# Patient Record
Sex: Female | Born: 1995 | Race: Black or African American | Hispanic: No | Marital: Single | Smoking: Former smoker
Health system: Southern US, Community
[De-identification: ages and names within clinical notes are randomized; demographics above are authoritative.]

## PROBLEM LIST (undated history)

## (undated) DIAGNOSIS — J309 Allergic rhinitis, unspecified: Secondary | ICD-10-CM

## (undated) DIAGNOSIS — Z8619 Personal history of other infectious and parasitic diseases: Secondary | ICD-10-CM

## (undated) DIAGNOSIS — R51 Headache: Secondary | ICD-10-CM

## (undated) DIAGNOSIS — G43829 Menstrual migraine, not intractable, without status migrainosus: Secondary | ICD-10-CM

## (undated) DIAGNOSIS — I10 Essential (primary) hypertension: Secondary | ICD-10-CM

## (undated) DIAGNOSIS — R519 Headache, unspecified: Secondary | ICD-10-CM

## (undated) HISTORY — DX: Personal history of other infectious and parasitic diseases: Z86.19

## (undated) HISTORY — PX: CHOLECYSTECTOMY: SHX55

## (undated) HISTORY — PX: MOUTH SURGERY: SHX715

## (undated) HISTORY — PX: SKIN BIOPSY: SHX1

## (undated) HISTORY — DX: Allergic rhinitis, unspecified: J30.9

## (undated) HISTORY — DX: Menstrual migraine, not intractable, without status migrainosus: G43.829

## (undated) HISTORY — DX: Essential (primary) hypertension: I10

---
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
## 2015-03-05 ENCOUNTER — Encounter: Payer: Self-pay | Admitting: Allergy and Immunology

## 2015-03-05 ENCOUNTER — Ambulatory Visit (INDEPENDENT_AMBULATORY_CARE_PROVIDER_SITE_OTHER): Payer: Medicaid Other | Admitting: Allergy and Immunology

## 2015-03-05 VITALS — BP 116/74 | HR 68 | Temp 97.8°F | Resp 18

## 2015-03-05 DIAGNOSIS — J4541 Moderate persistent asthma with (acute) exacerbation: Secondary | ICD-10-CM | POA: Diagnosis not present

## 2015-03-05 DIAGNOSIS — H1010 Acute atopic conjunctivitis, unspecified eye: Secondary | ICD-10-CM | POA: Diagnosis not present

## 2015-03-05 DIAGNOSIS — J309 Allergic rhinitis, unspecified: Secondary | ICD-10-CM

## 2015-03-05 MED ORDER — IPRATROPIUM BROMIDE 0.02 % IN SOLN
0.5000 mg | Freq: Once | RESPIRATORY_TRACT | Status: AC
  Administered 2015-03-05: 0.5 mg via RESPIRATORY_TRACT

## 2015-03-05 MED ORDER — ALBUTEROL SULFATE HFA 108 (90 BASE) MCG/ACT IN AERS: 2.0000 | INHALATION_SPRAY | RESPIRATORY_TRACT | Status: DC | PRN

## 2015-03-05 MED ORDER — LEVALBUTEROL HCL 1.25 MG/3ML IN NEBU
1.2500 mg | INHALATION_SOLUTION | Freq: Once | RESPIRATORY_TRACT | Status: AC
  Administered 2015-03-05: 1.25 mg via RESPIRATORY_TRACT

## 2015-03-05 MED ORDER — BECLOMETHASONE DIPROPIONATE 80 MCG/ACT IN AERS: 2.0000 | INHALATION_SPRAY | Freq: Two times a day (BID) | RESPIRATORY_TRACT | Status: DC

## 2015-03-05 NOTE — Patient Instructions (Addendum)
Take Home Sheet  1. Avoidance: Mite, Mold and Pollen   2. Antihistamine: Clarinex 5mg  by mouth once daily for runny nose or itching.   3. Nasal Spray: Dymista 1 spray(s) each nostril twice daily for stuffy nose or drainage.       Nasal Saline wash followed by above nasal spray twice daily as directed.  4. Inhalers:  Rescue: ProAir 2 puffs every 4 hours as needed for cough or wheeze.       -May use 2 puffs 10-20 minutes prior to exercise.   Preventative: QVAR 80mcg 2 puffs twice daily (Rinse, gargle, and spit out after use).   5.  Prednisone 30mg  now and 30mg  tomorrow.  6.  Information on allergy injections.      [facility].  7. Follow up Visit:  3 months or sooner if needed.                                 Call with any questions or concerns.   Websites that have reliable Patient information: 1. American Academy of Asthma, Allergy, & Immunology: www.aaaai.org 2. Food Allergy Network: www.foodallergy.org 3. Mothers of Asthmatics: www.aanma.org 4. [facility]: https://www.strong.com/www.njc.org 5. American College of Allergy, Asthma, & Immunology: [employer]ger[employer]ards.iswww.allergy.mcg.edu or www.acaai.org

## 2015-03-05 NOTE — Progress Notes (Signed)
FOLLOW UP NOTE  RE: RESHONDA KOERBER MRN: [id number] DOB: June 12, 1995 [facility] [place] [address] [place], [place] [address] Date of Office Visit: 03/05/2015  Subjective:  Roberta Guerrero is a 19 y.o. female who presents today for Nasal Congestion  Assessment:   1. Moderate persistent asthma, with acute exacerbation , in no respiratory distress, responsive to bronchodilator.    2. Allergic rhinoconjunctivitis, significant nasal congestion and perennial and seasonal hypersensitivities.    3.      Presumed tree nut allergy--avoidance for his explant place. 4.      Incomplete medication adherence. 5.      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 6.      Overweight. Plan:   Meds ordered this encounter  Medications  . beclomethasone (QVAR) 80 MCG/ACT inhaler    Sig: Inhale 2 puffs into the lungs 2 (two) times daily.    Dispense:  1 Inhaler    Refill:  3  . albuterol (PROAIR HFA) 108 (90 BASE) MCG/ACT inhaler    Sig: Inhale 2 puffs into the lungs every 4 (four) hours as needed for wheezing or shortness of breath.    Dispense:  8.5 Inhaler    Refill:  1   Patient Instructions   1. Avoidance: Mite, Mold and Pollen 2. Antihistamine: Clarinex  by mouth once daily for runny nose or itching. 3. Nasal Spray: Dymista 1 spray(s) each nostril twice daily for stuffy nose or drainage.       Nasal Saline wash followed by above nasal spray twice daily as directed. 4. Inhalers:  Rescue: ProAir 2 puffs every 4 hours as needed for cough or wheeze.       -May use 2 puffs 10-20 minutes prior to exercise.   Preventative: QVAR 2 puffs twice daily (Rinse, gargle, and spit out after use). 5.  Prednisone  now and  tomorrow. 6.  Information on allergy injections.      Review with [facility] administering injections. 7. Follow up Visit:  3 months or sooner if needed.                                 Call with any  questions or concerns.   HPI: Roberta Guerrero returns to the office with cough and congestion and requests for [employer] refill.  Since her visit in September, she has been sporadic using her maintenance medications and did not pickup Qvar.  At the visit in September, she received written prescriptions unclear what happened to the Qvar prescription.  She did not pickup med or call back when medications did not match her take-home instructions.  She reports intermittent nasal congestion associated postnasal drip with worsening chest congestion in the morning.  She reports rarely any cough or wheeze.  She denies difficulty breathing, shortness of breath, chest tightness or disrupted sleep or activity.  There have been no acute care  or emergency department visits, prednisone or other concerns or questions.  It is unclear why Mom or Alieyah did not call back with their concerns and frequent albuterol use.  She denies fever, headache, sore throat or discolored drainage.  Current Medications: 1.  ProAir HFA as needed.--Recently 2 puffs daily. 2.  Clarinex 5 mg once to 3 times weekly. 3.  Dymista one spray once twice daily 3 times weekly. 4.  EpiPen, Benadryl as needed.  Drug Allergies: Allergies  Allergen Reactions  . Pineapple Other (See Comments)    Makes tongue swell  . Corn-Containing Products Rash  . Other Rash    Nuts cause a rash.     Objective:   Filed Vitals:   03/05/15 1031  BP: 116/74  Pulse: 68  Temp: 97.8 F (36.6 C)  Resp: 18  O2                      98% Physical Exam  Constitutional: She is well-developed, well-nourished, and in no distress.  HENT:  Head: Atraumatic.  Right Ear: Tympanic membrane and ear canal normal.  Left Ear: Tympanic membrane and ear canal normal.  Nose: Mucosal edema present. No rhinorrhea. No epistaxis.  Mouth/Throat: Oropharynx is clear and moist and mucous membranes are normal. No oropharyngeal exudate, posterior oropharyngeal edema or posterior  oropharyngeal erythema.  Neck: Neck supple.  Cardiovascular: Normal rate, S1 normal and S2 normal.   No murmur heard. Pulmonary/Chest: Effort normal. She has no wheezes. She has no rhonchi. She has no rales.  Post Xopenex/Atrovent neb: Continues to be clear without adventitious breath sounds.  Symmetric aeration and patient reports improved.  Lymphadenopathy:    She has no cervical adenopathy.    Diagnostics: Spirometry:  FVC  2.67--81%,  FEV1  2.07--71%, FEF 2575 1.68--39%.  Postbronchodilator improvement:  FVC 3.44--105%, FEV1 2.87--98%, FEF 2575 2.92--68%.     Roselyn M. Willa RoughHicks, MD

## 2015-05-23 ENCOUNTER — Ambulatory Visit: Payer: Medicaid Other | Admitting: Family Medicine

## 2015-05-23 ENCOUNTER — Encounter: Payer: Self-pay | Admitting: Family Medicine

## 2015-05-23 ENCOUNTER — Ambulatory Visit (INDEPENDENT_AMBULATORY_CARE_PROVIDER_SITE_OTHER): Payer: Medicaid Other | Admitting: Family Medicine

## 2015-05-23 VITALS — BP 130/80 | Temp 98.3°F | Ht 66.5 in | Wt 264.1 lb

## 2015-05-23 DIAGNOSIS — J111 Influenza due to unidentified influenza virus with other respiratory manifestations: Secondary | ICD-10-CM | POA: Diagnosis not present

## 2015-05-23 DIAGNOSIS — J019 Acute sinusitis, unspecified: Secondary | ICD-10-CM | POA: Diagnosis not present

## 2015-05-23 DIAGNOSIS — B9689 Other specified bacterial agents as the cause of diseases classified elsewhere: Secondary | ICD-10-CM

## 2015-05-23 MED ORDER — HYDROCODONE-HOMATROPINE 5-1.5 MG/5ML PO SYRP
5.0000 mL | ORAL_SOLUTION | Freq: Four times a day (QID) | ORAL | Status: DC | PRN
Start: 2015-05-23 — End: 2015-07-25

## 2015-05-23 MED ORDER — AZITHROMYCIN 250 MG PO TABS: ORAL_TABLET | ORAL | Status: DC

## 2015-05-23 NOTE — Progress Notes (Signed)
   Subjective:    Patient ID: Roberta Guerrero, female    DOB: 07-11-1995, 20 y.o.   MRN: [id number]  Fever  This is a new problem. The current episode started in the past 7 days. The problem occurs intermittently. The problem has been unchanged. Her temperature was unmeasured prior to arrival. Associated symptoms include coughing, headaches, muscle aches and vomiting. She has tried nothing for the symptoms. The treatment provided no relief.   Patient relates feeling worse over the past several days. Started this past Sunday. Got worse over the past couple days. Doing a little bit better this afternoon. Feels she is over the vomiting. PMH benign   Review of Systems  Constitutional: Positive for fever.  Respiratory: Positive for cough.   Gastrointestinal: Positive for vomiting.  Neurological: Positive for headaches.       Objective:   Physical Exam  Lungs clear heart regular pulse normal mucous membranes moist eardrums normal      Assessment & Plan:  Influenza-has been going on too long to use Tamiflu Secondary rhinosinusitis antibiotics prescribed History of asthma no significant flareup Return to work in school next Monday If progressive troubles or worse follow-up here or ER Use albuterol as needed on a regular basis next few days

## 2015-06-03 ENCOUNTER — Telehealth: Payer: Self-pay | Admitting: Adult Health

## 2015-06-03 NOTE — Telephone Encounter (Signed)
Will call back at end of day has questions for school

## 2015-06-03 NOTE — Telephone Encounter (Signed)
Pt called stating that she would like to speak with Victorino Dike, pt did state that it is pretty urgent. Please contact pt

## 2015-06-04 NOTE — Telephone Encounter (Signed)
Will check email for questions

## 2015-06-11 ENCOUNTER — Ambulatory Visit: Payer: Medicaid Other | Admitting: Allergy and Immunology

## 2015-07-25 ENCOUNTER — Encounter: Payer: Self-pay | Admitting: Family Medicine

## 2015-07-25 ENCOUNTER — Ambulatory Visit (INDEPENDENT_AMBULATORY_CARE_PROVIDER_SITE_OTHER): Payer: Medicaid Other | Admitting: Family Medicine

## 2015-07-25 VITALS — BP 138/80 | Temp 98.7°F | Ht 66.0 in | Wt 275.0 lb

## 2015-07-25 DIAGNOSIS — N644 Mastodynia: Secondary | ICD-10-CM

## 2015-07-25 DIAGNOSIS — J329 Chronic sinusitis, unspecified: Secondary | ICD-10-CM

## 2015-07-25 DIAGNOSIS — I10 Essential (primary) hypertension: Secondary | ICD-10-CM | POA: Diagnosis not present

## 2015-07-25 MED ORDER — CETIRIZINE HCL 10 MG PO TABS: 10.0000 mg | ORAL_TABLET | Freq: Every day | ORAL | Status: DC

## 2015-07-25 MED ORDER — FLUTICASONE PROPIONATE 50 MCG/ACT NA SUSP: 2.0000 | Freq: Every day | NASAL | Status: DC

## 2015-07-25 MED ORDER — CEFPROZIL 500 MG PO TABS: 500.0000 mg | ORAL_TABLET | Freq: Two times a day (BID) | ORAL | Status: DC

## 2015-07-25 NOTE — Progress Notes (Signed)
   Subjective:    Patient ID: Roberta Guerrero, female    DOB: 03-Jul-1995, 20 y.o.   MRN: [id number]  Sinusitis This is a new problem. Episode onset: one week. Associated symptoms include coughing, ear pain, headaches and sneezing. (Watery eyes, Runny nose) Treatments tried: allergy med.   Bilateral breast pain and discharge. Started one week ago. bilat tend and pain, discharge is clear no fever no chills overall better now having menstrual cycle last week along with this  Some discharge  implanon since June, no meses for three yrs since then.  Patient on blood pressure medicine the past not taking currently. Next  Ongoing challenges with allergies plus minus compliance with meds Review of Systems  HENT: Positive for ear pain and sneezing.   Respiratory: Positive for cough.   Neurological: Positive for headaches.       Objective:   Physical Exam Alert.rp Alert, mild malaise. Hydration good Vitals stable. frontal/ maxillary tenderness evident positive nasal congestion. pharynx normal neck supple  lungs clear/no crackles or wheezes. heart regular in rhythm breastbilateral within normal limits no tenderness no discharge expressed       Assessment & Plan:  Impression 1 rhinosinusitis #2 transient breast tenderness with thin discharge potential hormone reaction discussed, persists touch base her GYN #3 hypertension off meds discussed patient wishes to hold off plan diet exercise discussed antibiotics prescribed allergy meds prescribed back to GYN of breast discharge persists

## 2015-09-02 ENCOUNTER — Ambulatory Visit (INDEPENDENT_AMBULATORY_CARE_PROVIDER_SITE_OTHER): Payer: Medicaid Other | Admitting: Family Medicine

## 2015-09-02 ENCOUNTER — Encounter: Payer: Self-pay | Admitting: Family Medicine

## 2015-09-02 VITALS — BP 162/88 | Temp 99.0°F | Ht 66.0 in | Wt 272.0 lb

## 2015-09-02 DIAGNOSIS — J019 Acute sinusitis, unspecified: Secondary | ICD-10-CM

## 2015-09-02 DIAGNOSIS — J4530 Mild persistent asthma, uncomplicated: Secondary | ICD-10-CM | POA: Diagnosis not present

## 2015-09-02 DIAGNOSIS — B9689 Other specified bacterial agents as the cause of diseases classified elsewhere: Secondary | ICD-10-CM

## 2015-09-02 MED ORDER — AZITHROMYCIN 250 MG PO TABS: ORAL_TABLET | ORAL | Status: DC

## 2015-09-02 MED ORDER — PREDNISONE 20 MG PO TABS: ORAL_TABLET | ORAL | Status: DC

## 2015-09-02 MED ORDER — BECLOMETHASONE DIPROPIONATE 80 MCG/ACT IN AERS: 2.0000 | INHALATION_SPRAY | Freq: Two times a day (BID) | RESPIRATORY_TRACT | Status: DC

## 2015-09-02 MED ORDER — ALBUTEROL SULFATE HFA 108 (90 BASE) MCG/ACT IN AERS: 2.0000 | INHALATION_SPRAY | RESPIRATORY_TRACT | Status: DC | PRN

## 2015-09-02 NOTE — Progress Notes (Signed)
   Subjective:    Patient ID: Roberta Guerrero, female    DOB: [birth year], 20 y.o.   MRN: [id number]  Sinusitis This is a new problem. Episode onset: 2 weeks  Associated symptoms include congestion, coughing and ear pain. (Wheezing, headache, nose bleeding)   Patient with significant sinus pressure as well as right ear pain and discomfort in addition to this having wheezing coughing denies high fever chills or sweats has a history of asthma   Review of Systems  Constitutional: Negative for fever and fatigue.  HENT: Positive for congestion and ear pain.   Respiratory: Positive for cough and wheezing.   Cardiovascular: Negative for chest pain.  Gastrointestinal: Negative for abdominal pain.       Objective:   Physical Exam  Constitutional: She appears well-developed.  HENT:  Head: Normocephalic.  Nose: Nose normal.  Mouth/Throat: Oropharynx is clear and moist. No oropharyngeal exudate.  Neck: Neck supple.  Cardiovascular: Normal rate and normal heart sounds.   No murmur heard. Pulmonary/Chest: Effort normal. She has wheezes.  Lymphadenopathy:    She has no cervical adenopathy.  Skin: Skin is warm and dry.  Nursing note and vitals reviewed.   Not respiratory distress      Assessment & Plan:  Sinus infection secondary right ear pain Asthma flareup Resume preventative inhaler on a daily basis Rescue inhaler when necessary Prednisone taper antibiotics Follow-up if progressive troubles warning signs discuss go to ER if worse

## 2015-10-11 ENCOUNTER — Encounter (HOSPITAL_COMMUNITY): Payer: Self-pay

## 2015-10-11 ENCOUNTER — Emergency Department (HOSPITAL_COMMUNITY)
Admission: EM | Admit: 2015-10-11 | Discharge: 2015-10-11 | Disposition: A | Discharge status: Home / Self Care | Payer: Medicaid Other | Attending: Emergency Medicine | Admitting: Emergency Medicine

## 2015-10-11 ENCOUNTER — Emergency Department (HOSPITAL_COMMUNITY): Payer: Medicaid Other

## 2015-10-11 DIAGNOSIS — I10 Essential (primary) hypertension: Secondary | ICD-10-CM | POA: Insufficient documentation

## 2015-10-11 DIAGNOSIS — R51 Headache: Secondary | ICD-10-CM | POA: Insufficient documentation

## 2015-10-11 DIAGNOSIS — Z79899 Other long term (current) drug therapy: Secondary | ICD-10-CM | POA: Diagnosis not present

## 2015-10-11 DIAGNOSIS — R0789 Other chest pain: Secondary | ICD-10-CM | POA: Insufficient documentation

## 2015-10-11 DIAGNOSIS — J45909 Unspecified asthma, uncomplicated: Secondary | ICD-10-CM | POA: Diagnosis not present

## 2015-10-11 DIAGNOSIS — R519 Headache, unspecified: Secondary | ICD-10-CM

## 2015-10-11 HISTORY — DX: Headache, unspecified: R51.9

## 2015-10-11 HISTORY — DX: Headache: R51

## 2015-10-11 LAB — BASIC METABOLIC PANEL
Anion gap: 6 (ref 5–15)
BUN: 16 mg/dL (ref 6–20)
CO2: 25 mmol/L (ref 22–32)
Calcium: 8.7 mg/dL — ABNORMAL LOW (ref 8.9–10.3)
Chloride: 106 mmol/L (ref 101–111)
Creatinine, Ser: 0.84 mg/dL (ref 0.44–1.00)
Glucose, Bld: 100 mg/dL — ABNORMAL HIGH (ref 65–99)
Potassium: 3.5 mmol/L (ref 3.5–5.1)
Sodium: 137 mmol/L (ref 135–145)

## 2015-10-11 LAB — CBC WITH DIFFERENTIAL/PLATELET
Basophils Absolute: 0.1 10*3/uL (ref 0.0–0.1)
Basophils Relative: 1 %
EOS ABS: 0.2 10*3/uL (ref 0.0–0.7)
EOS PCT: 3 %
HCT: 39.4 % (ref 36.0–46.0)
Hemoglobin: 12.8 g/dL (ref 12.0–15.0)
LYMPHS ABS: 3.3 10*3/uL (ref 0.7–4.0)
Lymphocytes Relative: 43 %
MCH: 26.2 pg (ref 26.0–34.0)
MCHC: 32.5 g/dL (ref 30.0–36.0)
MCV: 80.7 fL (ref 78.0–100.0)
Monocytes Absolute: 0.6 10*3/uL (ref 0.1–1.0)
Monocytes Relative: 8 %
Neutro Abs: 3.4 10*3/uL (ref 1.7–7.7)
Neutrophils Relative %: 45 %
PLATELETS: 280 10*3/uL (ref 150–400)
RBC: 4.88 MIL/uL (ref 3.87–5.11)
RDW: 13.5 % (ref 11.5–15.5)
WBC: 7.6 10*3/uL (ref 4.0–10.5)

## 2015-10-11 LAB — D-DIMER, QUANTITATIVE (NOT AT ARMC)

## 2015-10-11 LAB — TROPONIN I: Troponin I: <0.03 ng/mL (ref <0.03)

## 2015-10-11 MED ORDER — ACETAMINOPHEN 500 MG PO TABS
1000.0000 mg | ORAL_TABLET | Freq: Once | ORAL | Status: AC
  Administered 2015-10-11: 1000 mg via ORAL
  Filled 2015-10-11: qty 2

## 2015-10-11 MED ORDER — METOCLOPRAMIDE HCL 5 MG/ML IJ SOLN: 10.0000 mg | Freq: Once | INTRAMUSCULAR | Status: DC

## 2015-10-11 MED ORDER — KETOROLAC TROMETHAMINE 30 MG/ML IJ SOLN
30.0000 mg | Freq: Once | INTRAMUSCULAR | Status: AC
  Administered 2015-10-11: 30 mg via INTRAVENOUS
  Filled 2015-10-11: qty 1

## 2015-10-11 MED ORDER — METOCLOPRAMIDE HCL 5 MG/ML IJ SOLN
10.0000 mg | Freq: Once | INTRAMUSCULAR | Status: AC
  Administered 2015-10-11: 10 mg via INTRAVENOUS
  Filled 2015-10-11: qty 2

## 2015-10-11 MED ORDER — METOCLOPRAMIDE HCL 10 MG PO TABS: 10.0000 mg | ORAL_TABLET | Freq: Four times a day (QID) | ORAL | Status: DC | PRN

## 2015-10-11 MED ORDER — KETOROLAC TROMETHAMINE 60 MG/2ML IM SOLN: 60.0000 mg | Freq: Once | INTRAMUSCULAR | Status: DC

## 2015-10-11 NOTE — ED Provider Notes (Signed)
CSN: [id number]     Arrival date & time 10/11/15  1733 History   First MD Initiated Contact with Patient 10/11/15 1747     Chief Complaint  Patient presents with  . Chest Pain      HPI Pt was seen at 1800. Per pt, c/o gradual onset and persistence of constant acute flair of her chronic migraine headache for the past several days.  Describes the headache as per her usual chronic migraine headache pain pattern for many years. States she took "motrin yesterday or the day before" for her headache. Denies headache was sudden or maximal in onset or at any time.  Denies visual changes, no focal motor weakness, no tingling/numbness in extremities, no fevers, no neck pain, no rash.  Pt also c/o gradual onset and persistence of constant "sharp" mid-sternal chest pain that began approximately 1500 PTA. Pt states she was "walking inside the house from outside" when her discomfort began. Denies palpitations, no SOB/cough, no abd pain, no N/V/D, no back pain.     Past Medical History  Diagnosis Date  . Asthma   . Allergic rhinitis   . Menstrual migraine   . Hypertension   . Headache    Past Surgical History  Procedure Laterality Date  . Cholecystectomy    . Skin biopsy    . Mouth surgery     Family History  Problem Relation Age of Onset  . Hypertension Mother   . Hypertension Father   . Hyperlipidemia Father   . Hypertension Maternal Grandmother   . Diabetes Maternal Grandmother   . Alcohol abuse Maternal Grandfather    Social History  Substance Use Topics  . Smoking status: Never Smoker   . Smokeless tobacco: Never Used  . Alcohol Use: No    Review of Systems ROS: Statement: All systems negative except as marked or noted in the HPI; Constitutional: Negative for fever and chills. ; ; Eyes: Negative for eye pain, redness and discharge. ; ; ENMT: Negative for ear pain, hoarseness, nasal congestion, sinus pressure and sore throat. ; ; Cardiovascular: Negative for palpitations,  diaphoresis, dyspnea and peripheral edema. ; ; Respiratory: Negative for cough, wheezing and stridor. ; ; Gastrointestinal: Negative for nausea, vomiting, diarrhea, abdominal pain, blood in stool, hematemesis, jaundice and rectal bleeding. . ; ; Genitourinary: Negative for dysuria, flank pain and hematuria. ; ; Musculoskeletal: +chest wall pain. Negative for back pain and neck pain. Negative for swelling and trauma.; ; Skin: Negative for pruritus, rash, abrasions, blisters, bruising and skin lesion.; ; Neuro: +headache. Negative for lightheadedness and neck stiffness. Negative for weakness, altered level of consciousness, altered mental status, extremity weakness, paresthesias, involuntary movement, seizure and syncope.      Allergies  Pineapple; Corn-containing products; and Other  Home Medications   Prior to Admission medications   Medication Sig Start Date End Date Taking? Authorizing Provider  albuterol (PROAIR HFA) 108 (90 Base) MCG/ACT inhaler Inhale 2 puffs into the lungs every 4 (four) hours as needed for wheezing or shortness of breath. 09/02/15  Yes Babs SciaraScott A Luking, MD  beclomethasone (QVAR) 80 MCG/ACT inhaler Inhale 2 puffs into the lungs 2 (two) times daily. 09/02/15  Yes Babs SciaraScott A Luking, MD  EPINEPHrine 0.3 mg/0.3 mL IJ SOAJ injection Inject 0.3 mg into the muscle once.   Yes Historical Provider, MD  fluticasone (FLONASE) 50 MCG/ACT nasal spray Place 2 sprays into both nostrils daily. 07/25/15  Yes Merlyn AlbertWilliam S Luking, MD   BP 147/92 mmHg  Pulse 98  Temp(Src)  98.4 F (36.9 C) (Oral)  Resp 18  Ht  (1.676 m)  Wt 272 lb (123.378 kg)  BMI 43.92 kg/m2  SpO2 100% Physical Exam  1805: Physical examination:  Nursing notes reviewed; Vital signs and O2 SAT reviewed;  Constitutional: Well developed, Well nourished, Well hydrated, In no acute distress; Head:  Normocephalic, atraumatic; Eyes: EOMI, PERRL, No scleral icterus; ENMT: Mouth and pharynx normal, Mucous membranes moist; Neck:  Supple, Full range of motion, No lymphadenopathy; Cardiovascular: Regular rate and rhythm, No murmur, rub, or gallop; Respiratory: Breath sounds clear & equal bilaterally, No rales, rhonchi, wheezes. Intermittently hyperventilating during exam. Speaking full sentences with ease, Normal respiratory effort/excursion; Chest: +parasternal and anterior chest wall tender, No deformity. Movement normal; Abdomen: Soft, Nontender, Nondistended, Normal bowel sounds; Genitourinary: No CVA tenderness; Extremities: Pulses normal, No tenderness, No edema, No calf edema or asymmetry.; Neuro: AA&Ox3, Major CN grossly intact.  Speech clear. No gross focal motor or sensory deficits in extremities.; Skin: Color normal, Warm, Dry.; Psych:  Affect flat, poor eye contact.    ED Course  Procedures (including critical care time) Labs Review   Imaging Review  I have personally reviewed and evaluated these images and lab results as part of my medical decision-making.   EKG Interpretation None      MDM  MDM Reviewed: previous chart, nursing note and vitals Reviewed previous: labs and ECG Interpretation: labs, ECG and x-ray     ED ECG REPORT   Date: 10/11/2015  Rate: 101  Rhythm: sinus tachycardia  QRS Axis: normal  Intervals: normal  ST/T Wave abnormalities: normal  Conduction Disutrbances:none  Narrative Interpretation:   Old EKG Reviewed: changes noted; rate faster since previous EKG dated 12/09/2013.   Results for orders placed or performed during the hospital encounter of 10/11/15  Basic metabolic panel  Result Value Ref Range   Sodium 137 135 - 145 mmol/L   Potassium 3.5 3.5 - 5.1 mmol/L   Chloride 106 101 - 111 mmol/L   CO2 25 22 - 32 mmol/L   Glucose, Bld 100 (H) 65 - 99 mg/dL   BUN 16 6 - 20 mg/dL   Creatinine, Ser 1.61 0.44 - 1.00 mg/dL   Calcium 8.7 (L) 8.9 - 10.3 mg/dL   GFR calc non Af Amer >60 >60 mL/min   GFR calc Af Amer >60 >60 mL/min   Anion gap 6 5 - 15  Troponin I  Result  Value Ref Range   Troponin I <0.03 <0.03 ng/mL  CBC with Differential  Result Value Ref Range   WBC 7.6 4.0 - 10.5 K/uL   RBC 4.88 3.87 - 5.11 MIL/uL   Hemoglobin 12.8 12.0 - 15.0 g/dL   HCT 09.6 04.5 - 40.9 %   MCV 80.7 78.0 - 100.0 fL   MCH 26.2 26.0 - 34.0 pg   MCHC 32.5 30.0 - 36.0 g/dL   RDW 81.1 91.4 - 78.2 %   Platelets 280 150 - 400 K/uL   Neutrophils Relative % 45 %   Neutro Abs 3.4 1.7 - 7.7 K/uL   Lymphocytes Relative 43 %   Lymphs Abs 3.3 0.7 - 4.0 K/uL   Monocytes Relative 8 %   Monocytes Absolute 0.6 0.1 - 1.0 K/uL   Eosinophils Relative 3 %   Eosinophils Absolute 0.2 0.0 - 0.7 K/uL   Basophils Relative 1 %   Basophils Absolute 0.1 0.0 - 0.1 K/uL  D-dimer, quantitative  Result Value Ref Range   D-Dimer, Quant <0.27 0.00 -  0.50 ug/mL-FEU   Dg Chest 2 View 10/11/2015  CLINICAL DATA:  Chest pain.  Asthma.  Hypertension EXAM: CHEST  2 VIEW COMPARISON:  08/26/2012 FINDINGS: The heart size and mediastinal contours are within normal limits. Both lungs are clear. The visualized skeletal structures are unremarkable. IMPRESSION: No active cardiopulmonary disease. Electronically Signed   By: Marlan Palauharles  Clark M.D.   On: 10/11/2015 18:57    2025:  Feels better after meds and is ready to go home now. Doubt PE as cause for symptoms with normal d-dimer and low risk [place].  Doubt ACS as cause for symptoms with normal troponin and essentially unchanged EKG from previous with TIMI 0/HEART 0. Tx symptomatically at this time. Dx and testing d/w pt and family.  Questions answered.  Verb understanding, agreeable to d/c home with outpt f/u.   Samuel JesterKathleen Lakendria Nicastro, DO 10/13/15 1459

## 2015-10-11 NOTE — Discharge Instructions (Signed)
Take over the counter tylenol and ibuprofen (OR excedrin) and benadryl, as directed on packaging, with the prescription given to you today, as needed for headache.  Keep a headache diary, as discussed.  Call your regular medical doctor on Monday to schedule a follow up appointment within the next 3 days.  Return to the Emergency Department immediately sooner if worsening.  ° °

## 2015-10-11 NOTE — ED Notes (Signed)
C/o headache and chest pain that started today at 1500

## 2016-01-12 ENCOUNTER — Encounter (HOSPITAL_COMMUNITY): Payer: Self-pay | Admitting: *Deleted

## 2016-01-12 ENCOUNTER — Emergency Department (HOSPITAL_COMMUNITY): Payer: Self-pay

## 2016-01-12 ENCOUNTER — Emergency Department (HOSPITAL_COMMUNITY)
Admission: EM | Admit: 2016-01-12 | Discharge: 2016-01-12 | Disposition: A | Discharge status: Home / Self Care | Payer: Self-pay | Attending: Emergency Medicine | Admitting: Emergency Medicine

## 2016-01-12 DIAGNOSIS — J45901 Unspecified asthma with (acute) exacerbation: Secondary | ICD-10-CM | POA: Insufficient documentation

## 2016-01-12 DIAGNOSIS — R0789 Other chest pain: Secondary | ICD-10-CM

## 2016-01-12 DIAGNOSIS — Z79899 Other long term (current) drug therapy: Secondary | ICD-10-CM | POA: Insufficient documentation

## 2016-01-12 DIAGNOSIS — R000 Tachycardia, unspecified: Secondary | ICD-10-CM | POA: Insufficient documentation

## 2016-01-12 DIAGNOSIS — I10 Essential (primary) hypertension: Secondary | ICD-10-CM | POA: Insufficient documentation

## 2016-01-12 MED ORDER — ALBUTEROL SULFATE (2.5 MG/3ML) 0.083% IN NEBU
5.0000 mg | INHALATION_SOLUTION | Freq: Once | RESPIRATORY_TRACT | Status: AC
  Administered 2016-01-12: 5 mg via RESPIRATORY_TRACT
  Filled 2016-01-12: qty 6

## 2016-01-12 MED ORDER — PREDNISONE 50 MG PO TABS
60.0000 mg | ORAL_TABLET | Freq: Once | ORAL | Status: AC
  Administered 2016-01-12: 60 mg via ORAL
  Filled 2016-01-12: qty 1

## 2016-01-12 MED ORDER — PREDNISONE 20 MG PO TABS: 40.0000 mg | ORAL_TABLET | Freq: Every day | ORAL | 0 refills | Status: DC

## 2016-01-12 MED ORDER — IPRATROPIUM BROMIDE 0.02 % IN SOLN
0.5000 mg | Freq: Once | RESPIRATORY_TRACT | Status: AC
  Administered 2016-01-12: 0.5 mg via RESPIRATORY_TRACT
  Filled 2016-01-12: qty 2.5

## 2016-01-12 MED ORDER — IBUPROFEN 800 MG PO TABS
800.0000 mg | ORAL_TABLET | Freq: Once | ORAL | Status: AC
  Administered 2016-01-12: 800 mg via ORAL
  Filled 2016-01-12: qty 1

## 2016-01-12 NOTE — ED Triage Notes (Signed)
Pt says SOB started this morning. Tonight she was sitting in car about 2 hours ago & her chest stated hurting. Pt sates a stabbing pain. Pt having some SOB has used inhaler 7 times today.

## 2016-01-12 NOTE — ED Provider Notes (Signed)
[facility] Provider Note   CSN: [id number] Arrival date & time: 01/12/16  0137     History   Chief Complaint Chief Complaint  Patient presents with  . Chest Pain    HPI Roberta Guerrero is a 20 y.o. female.  HPI Patient presents with chest pain shortness of breath. States she's had shortness of breath the last few days and had to use inhaler more. States that today the inhaler did not work on this most recent time. States she has some acute chest pain. Stabbing. States it is worse with breathing. No coughing. No dominant pain. No swelling or legs. Pain is also worse with movements. No fevers or chills.   Past Medical History:  Diagnosis Date  . Allergic rhinitis   . Asthma   . Headache   . Hypertension   . Menstrual migraine     Patient Active Problem List   Diagnosis Date Noted  . Nexplanon insertion 07/18/2014  . Essential hypertension, benign 03/23/2014  . Food allergy 07/24/2013  . Obesity, morbid (HCC) 06/14/2013  . 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 Gastritis 02/26/2013  . Eczematous dermatitis of eyelid 09/26/2012  . Eczema 09/26/2012  . Asthma, chronic 08/09/2012  . Allergic rhinitis 08/09/2012  . Rash and nonspecific skin eruption 08/09/2012    Past Surgical History:  Procedure Laterality Date  . CHOLECYSTECTOMY    . MOUTH SURGERY    . SKIN BIOPSY      OB History    No data available       Home Medications    Prior to Admission medications   Medication Sig Start Date End Date Taking? Authorizing Provider  albuterol (PROAIR HFA) 108 (90 Base) MCG/ACT inhaler Inhale 2 puffs into the lungs every 4 (four) hours as needed for wheezing or shortness of breath. 09/02/15  Yes Babs Sciara, MD  beclomethasone (QVAR) 80 MCG/ACT inhaler Inhale 2 puffs into the lungs 2 (two) times daily. 09/02/15  Yes Babs Sciara, MD  EPINEPHrine 0.3 mg/0.3 mL IJ SOAJ injection Inject 0.3 mg into the muscle once.   Yes Historical Provider, MD  fluticasone  (FLONASE) 50 MCG/ACT nasal spray Place 2 sprays into both nostrils daily. 07/25/15   Merlyn Albert, MD  metoCLOPramide (REGLAN) 10 MG tablet Take 1 tablet (10 mg total) by mouth every 6 (six) hours as needed for nausea (or headache). 10/11/15   Samuel Jester, DO  predniSONE (DELTASONE) 20 MG tablet Take 2 tablets (40 mg total) by mouth daily. 01/12/16   Benjiman Core, MD    Family History Family History  Problem Relation Age of Onset  . Hypertension Mother   . Hypertension Father   . Hyperlipidemia Father   . Hypertension Maternal Grandmother   . Diabetes Maternal Grandmother   . Alcohol abuse Maternal Grandfather     Social History Social History  Substance Use Topics  . Smoking status: Never Smoker  . Smokeless tobacco: Never Used  . Alcohol use No     Allergies   Pineapple; Corn-containing products; and Other   Review of Systems Review of Systems  Constitutional: Negative for appetite change.  HENT: Negative for congestion.   Respiratory: Positive for chest tightness, shortness of breath and wheezing.   Cardiovascular: Positive for chest pain. Negative for leg swelling.  Gastrointestinal: Negative for abdominal pain.  Endocrine: Negative for polyphagia and polyuria.  Genitourinary: Negative for flank pain.  Musculoskeletal: Negative for back pain.  Neurological: Negative for weakness and numbness.  Hematological: Negative for  adenopathy.     Physical Exam Updated Vital Signs Pulse 104   Temp 97.8 F (36.6 C) (Oral)   Resp 20   Ht 5\' 5"  (1.651 m)   Wt 280 lb (127 kg)   SpO2 98%   BMI 46.59 kg/m   Physical Exam  Constitutional: She appears well-developed.  HENT:  Head: Atraumatic.  Eyes: EOM are normal.  Cardiovascular:  Mild tachycardia  Pulmonary/Chest: She exhibits tenderness.  Prolonged expirations and some wheezes. Wheezes worse on the left side. Tenderness to the right parasternal area.  Abdominal: Soft. There is no tenderness.    Musculoskeletal: She exhibits no edema.  Neurological: She is alert.  Skin: Skin is warm. Capillary refill takes less than 2 seconds.     ED Treatments / Results  Labs (all labs ordered are listed, but only abnormal results are displayed) Labs Reviewed - No data to display  EKG  EKG Interpretation None       Radiology Dg Chest 2 View  Result Date: 01/12/2016 CLINICAL DATA:  Shortness of breath since Saturday morning. Mid chest pain for 3 hours. History of asthma and hypertension. Nonsmoker. EXAM: CHEST  2 VIEW COMPARISON:  10/11/2015 FINDINGS: The heart size and mediastinal contours are within normal limits. Both lungs are clear. The visualized skeletal structures are unremarkable. IMPRESSION: No active cardiopulmonary disease. Electronically Signed   By: Burman NievesWilliam  Stevens M.D.   On: 01/12/2016 02:52    Procedures Procedures (including critical care time)  Medications Ordered in ED Medications  ibuprofen (ADVIL,MOTRIN) tablet 800 mg (not administered)  predniSONE (DELTASONE) tablet 60 mg (not administered)  albuterol (PROVENTIL) (2.5 MG/3ML) 0.083% nebulizer solution 5 mg (5 mg Nebulization Given 01/12/16 0215)  ipratropium (ATROVENT) nebulizer solution 0.5 mg (0.5 mg Nebulization Given 01/12/16 0215)     Initial Impression / Assessment and Plan / ED Course  I have reviewed the triage vital signs and the nursing notes.  Pertinent labs & imaging results that were available during my care of the patient were reviewed by me and considered in my medical decision making (see chart for details).  Clinical Course    patient with chest pain. Appears to muscle skeletal. Also likely asthma exacerbation. Feels better after treatment. Doubt cardiac cause. X-ray reassuring. Discharge home. We'll give steroids for the wheezing.  Final Clinical Impressions(s) / ED Diagnoses   Final diagnoses:  Asthma, unspecified asthma severity, with acute exacerbation  Chest wall pain    New  Prescriptions New Prescriptions   PREDNISONE (DELTASONE) 20 MG TABLET    Take 2 tablets (40 mg total) by mouth daily.     Benjiman CoreNathan Deborahann Poteat, MD 01/12/16 [phone]

## 2016-01-12 NOTE — ED Notes (Signed)
This RN called this pt because she left her bracelet.

## 2016-01-13 ENCOUNTER — Ambulatory Visit: Payer: Medicaid Other | Admitting: Family Medicine

## 2016-02-06 ENCOUNTER — Emergency Department (HOSPITAL_COMMUNITY)
Admission: EM | Admit: 2016-02-06 | Discharge: 2016-02-06 | Disposition: A | Discharge status: Home / Self Care | Payer: Medicaid Other | Attending: Emergency Medicine | Admitting: Emergency Medicine

## 2016-02-06 ENCOUNTER — Encounter (HOSPITAL_COMMUNITY): Payer: Self-pay | Admitting: Emergency Medicine

## 2016-02-06 DIAGNOSIS — J45909 Unspecified asthma, uncomplicated: Secondary | ICD-10-CM | POA: Insufficient documentation

## 2016-02-06 DIAGNOSIS — I10 Essential (primary) hypertension: Secondary | ICD-10-CM | POA: Insufficient documentation

## 2016-02-06 DIAGNOSIS — J029 Acute pharyngitis, unspecified: Secondary | ICD-10-CM | POA: Insufficient documentation

## 2016-02-06 DIAGNOSIS — Z79899 Other long term (current) drug therapy: Secondary | ICD-10-CM | POA: Insufficient documentation

## 2016-02-06 MED ORDER — IBUPROFEN 600 MG PO TABS: 600.0000 mg | ORAL_TABLET | Freq: Four times a day (QID) | ORAL | 0 refills | Status: DC | PRN

## 2016-02-06 MED ORDER — PENICILLIN G BENZATHINE 1200000 UNIT/2ML IM SUSP
1.2000 10*6.[IU] | Freq: Once | INTRAMUSCULAR | Status: AC
  Administered 2016-02-06: 1.2 10*6.[IU] via INTRAMUSCULAR
  Filled 2016-02-06: qty 2

## 2016-02-06 NOTE — ED Provider Notes (Signed)
[facility] Provider Note   CSN: [id number] Arrival date & time: 02/06/16  1432     History   Chief Complaint Chief Complaint  Patient presents with  . Sore Throat    HPI Roberta Guerrero is a 20 y.o. female.  HPI   Roberta Guerrero is a 20 y.o. female who presents to the Emergency Department complaining of sore throat for one day.  She complains of pain with swallowing and noticed white patches to her throat.  She also reports having swollen lymph nodes and generalized body aches.  She denies known fever.  She has taken OTC analgesics without relief.  She denies vomiting, rash, cough, neck pain or stiffness and abdominal pain.  Past Medical History:  Diagnosis Date  . Allergic rhinitis   . Asthma   . Headache   . Hypertension   . Menstrual migraine     Patient Active Problem List   Diagnosis Date Noted  . Nexplanon insertion 07/18/2014  . Essential hypertension, benign 03/23/2014  . Food allergy 07/24/2013  . Obesity, morbid (HCC) 06/14/2013  . 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 Gastritis 02/26/2013  . Eczematous dermatitis of eyelid 09/26/2012  . Eczema 09/26/2012  . Asthma, chronic 08/09/2012  . Allergic rhinitis 08/09/2012  . Rash and nonspecific skin eruption 08/09/2012    Past Surgical History:  Procedure Laterality Date  . CHOLECYSTECTOMY    . MOUTH SURGERY    . SKIN BIOPSY      OB History    No data available       Home Medications    Prior to Admission medications   Medication Sig Start Date End Date Taking? Authorizing Provider  albuterol (PROAIR HFA) 108 (90 Base) MCG/ACT inhaler Inhale 2 puffs into the lungs every 4 (four) hours as needed for wheezing or shortness of breath. 09/02/15   Babs Sciara, MD  beclomethasone (QVAR) 80 MCG/ACT inhaler Inhale 2 puffs into the lungs 2 (two) times daily. 09/02/15   Babs Sciara, MD  EPINEPHrine 0.3 mg/0.3 mL IJ SOAJ injection Inject 0.3 mg into the muscle once.    Historical Provider, MD    fluticasone (FLONASE) 50 MCG/ACT nasal spray Place 2 sprays into both nostrils daily. 07/25/15   Merlyn Albert, MD  metoCLOPramide (REGLAN) 10 MG tablet Take 1 tablet (10 mg total) by mouth every 6 (six) hours as needed for nausea (or headache). 10/11/15   Samuel Jester, DO  predniSONE (DELTASONE) 20 MG tablet Take 2 tablets (40 mg total) by mouth daily. 01/12/16   Benjiman Core, MD    Family History Family History  Problem Relation Age of Onset  . Hypertension Mother   . Hypertension Father   . Hyperlipidemia Father   . Hypertension Maternal Grandmother   . Diabetes Maternal Grandmother   . Alcohol abuse Maternal Grandfather     Social History Social History  Substance Use Topics  . Smoking status: Never Smoker  . Smokeless tobacco: Never Used  . Alcohol use No     Allergies   Pineapple; Corn-containing products; and Other   Review of Systems Review of Systems  Constitutional: Negative for activity change, appetite change, chills and fever.  HENT: Positive for sore throat. Negative for congestion, ear pain, facial swelling, trouble swallowing and voice change.   Eyes: Negative for pain and visual disturbance.  Respiratory: Negative for cough and shortness of breath.   Gastrointestinal: Negative for abdominal pain, nausea and vomiting.  Musculoskeletal: Negative for arthralgias, neck pain and  neck stiffness.  Skin: Negative for color change and rash.  Neurological: Negative for dizziness, facial asymmetry, speech difficulty, numbness and headaches.  Hematological: Positive for adenopathy.  All other systems reviewed and are negative.    Physical Exam Updated Vital Signs BP 154/95   Pulse 96   Temp 98.6 F (37 C) (Oral)   Resp 18   Ht 5\' 5"  (1.651 m)   Wt 127 kg   SpO2 100%   BMI 46.59 kg/m   Physical Exam  Constitutional: She is oriented to person, place, and time. She appears well-developed and well-nourished. No distress.  HENT:  Head:  Normocephalic and atraumatic.  Right Ear: Tympanic membrane and ear canal normal.  Left Ear: Tympanic membrane and ear canal normal.  Mouth/Throat: Uvula is midline and mucous membranes are normal. No trismus in the jaw. No uvula swelling. Oropharyngeal exudate, posterior oropharyngeal edema and posterior oropharyngeal erythema present. No tonsillar abscesses. Tonsils are 1+ on the right. Tonsils are 1+ on the left.  Neck: Normal range of motion. Neck supple.  Cardiovascular: Normal rate and regular rhythm.   Pulmonary/Chest: Effort normal and breath sounds normal.  Abdominal: Soft. There is no splenomegaly. There is no tenderness.  Musculoskeletal: Normal range of motion.  Lymphadenopathy:    She has no cervical adenopathy.  Neurological: She is alert and oriented to person, place, and time. She exhibits normal muscle tone. Coordination normal.  Skin: Skin is warm and dry.  Nursing note and vitals reviewed.    ED Treatments / Results  Labs (all labs ordered are listed, but only abnormal results are displayed) Labs Reviewed - No data to display  EKG  EKG Interpretation None       Radiology No results found.  Procedures Procedures (including critical care time)  Medications Ordered in ED Medications  penicillin g benzathine (BICILLIN LA) 1200000 UNIT/2ML injection 1.2 Million Units (not administered)     Initial Impression / Assessment and Plan / ED Course  I have reviewed the triage vital signs and the nursing notes.  Pertinent labs & imaging results that were available during my care of the patient were reviewed by me and considered in my medical decision making (see chart for details).  Clinical Course   Pt non-toxic appearing.  Airway patent.  No concerning sx's for PTA.     IM Bicillin injection.  Pt agrees to fluids and tylenol or ibuprofen if needed  Final Clinical Impressions(s) / ED Diagnoses   Final diagnoses:  Acute pharyngitis, unspecified etiology      New Prescriptions New Prescriptions   No medications on file     Pauline Ausammy Carlei Huang, Cordelia Poche-C 02/09/16 1741    Bethann BerkshireJoseph Zammit, MD 02/10/16 [phone]

## 2016-02-06 NOTE — Discharge Instructions (Signed)
Follow-up primary doctor for recheck if needed. Return to ER for any worsening symptoms

## 2016-02-06 NOTE — ED Triage Notes (Signed)
Pt reports sore throat with white patches beginning last night.

## 2016-04-20 ENCOUNTER — Emergency Department (HOSPITAL_COMMUNITY)
Admission: EM | Admit: 2016-04-20 | Discharge: 2016-04-20 | Disposition: A | Discharge status: Home / Self Care | Payer: Medicaid Other | Attending: Emergency Medicine | Admitting: Emergency Medicine

## 2016-04-20 ENCOUNTER — Encounter (HOSPITAL_COMMUNITY): Payer: Self-pay | Admitting: *Deleted

## 2016-04-20 DIAGNOSIS — I10 Essential (primary) hypertension: Secondary | ICD-10-CM | POA: Insufficient documentation

## 2016-04-20 DIAGNOSIS — J039 Acute tonsillitis, unspecified: Secondary | ICD-10-CM | POA: Insufficient documentation

## 2016-04-20 DIAGNOSIS — J45909 Unspecified asthma, uncomplicated: Secondary | ICD-10-CM | POA: Insufficient documentation

## 2016-04-20 LAB — RAPID STREP SCREEN (MED CTR MEBANE ONLY): Streptococcus, Group A Screen (Direct): NEGATIVE

## 2016-04-20 MED ORDER — CLINDAMYCIN HCL 150 MG PO CAPS: 300.0000 mg | ORAL_CAPSULE | Freq: Three times a day (TID) | ORAL | 0 refills | Status: DC

## 2016-04-20 MED ORDER — CLINDAMYCIN HCL 150 MG PO CAPS
300.0000 mg | ORAL_CAPSULE | Freq: Once | ORAL | Status: AC
  Administered 2016-04-20: 300 mg via ORAL
  Filled 2016-04-20: qty 2

## 2016-04-20 MED ORDER — MAGIC MOUTHWASH W/LIDOCAINE: 5.0000 mL | Freq: Three times a day (TID) | ORAL | 0 refills | Status: DC | PRN

## 2016-04-20 NOTE — ED Provider Notes (Signed)
[facility]-EM[facility]Y DEPT Provider Note   CSN: [id number] Arrival date & time: 04/20/16  2132     History   Chief Complaint Chief Complaint  Patient presents with  . Sore Throat    HPI Roberta Guerrero is a 21 y.o. female.  HPI   Roberta Guerrero is a 21 y.o. female who presents to the Emergency Department complaining of sore throat for 3 days.  She describes pain with swallowing, white patches to her tonsils, generalized body aches and nasal congestion.  She denies fever, headaches, cough, neck pain and shortness of breath.  She has tried OTC analgesics without relief.     Past Medical History:  Diagnosis Date  . Allergic rhinitis   . Asthma   . Headache   . Hypertension   . Menstrual migraine     Patient Active Problem List   Diagnosis Date Noted  . Nexplanon insertion 07/18/2014  . Essential hypertension, benign 03/23/2014  . Food allergy 07/24/2013  . Obesity, morbid (HCC) 06/14/2013  . 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 Gastritis 02/26/2013  . Eczematous dermatitis of eyelid 09/26/2012  . Eczema 09/26/2012  . Asthma, chronic 08/09/2012  . Allergic rhinitis 08/09/2012  . Rash and nonspecific skin eruption 08/09/2012    Past Surgical History:  Procedure Laterality Date  . CHOLECYSTECTOMY    . MOUTH SURGERY    . SKIN BIOPSY      OB History    No data available       Home Medications    Prior to Admission medications   Medication Sig Start Date End Date Taking? Authorizing Provider  albuterol (PROAIR HFA) 108 (90 Base) MCG/ACT inhaler Inhale 2 puffs into the lungs every 4 (four) hours as needed for wheezing or shortness of breath. 09/02/15   Babs SciaraScott A Luking, MD  beclomethasone (QVAR) 80 MCG/ACT inhaler Inhale 2 puffs into the lungs 2 (two) times daily. 09/02/15   Babs SciaraScott A Luking, MD  EPINEPHrine 0.3 mg/0.3 mL IJ SOAJ injection Inject 0.3 mg into the muscle once.    Historical Provider, MD  fluticasone (FLONASE) 50 MCG/ACT nasal spray Place 2 sprays into both  nostrils daily. 07/25/15   Merlyn AlbertWilliam S Luking, MD  ibuprofen (ADVIL,MOTRIN) 600 MG tablet Take 1 tablet (600 mg total) by mouth every 6 (six) hours as needed. Take with food 02/06/16   Valoree Agent, PA-C  metoCLOPramide (REGLAN) 10 MG tablet Take 1 tablet (10 mg total) by mouth every 6 (six) hours as needed for nausea (or headache). 10/11/15   Samuel JesterKathleen McManus, DO  predniSONE (DELTASONE) 20 MG tablet Take 2 tablets (40 mg total) by mouth daily. 01/12/16   Benjiman CoreNathan Pickering, MD    Family History Family History  Problem Relation Age of Onset  . Hypertension Mother   . Hypertension Father   . Hyperlipidemia Father   . Hypertension Maternal Grandmother   . Diabetes Maternal Grandmother   . Alcohol abuse Maternal Grandfather     Social History Social History  Substance Use Topics  . Smoking status: Never Smoker  . Smokeless tobacco: Never Used  . Alcohol use No     Allergies   Pineapple; Corn-containing products; and Other   Review of Systems Review of Systems  Constitutional: Negative for activity change, appetite change, chills and fever.  HENT: Positive for congestion and sore throat. Negative for facial swelling, rhinorrhea and trouble swallowing.   Eyes: Negative for visual disturbance.  Respiratory: Negative for cough, shortness of breath, wheezing and stridor.   Gastrointestinal: Negative  for nausea and vomiting.  Musculoskeletal: Positive for myalgias. Negative for neck pain and neck stiffness.  Skin: Negative.   Neurological: Negative for dizziness, weakness, numbness and headaches.  Hematological: Negative for adenopathy.  Psychiatric/Behavioral: Negative for confusion.  All other systems reviewed and are negative.    Physical Exam Updated Vital Signs BP 160/92 (BP Location: Right Arm)   Pulse 98   Temp 98.4 F (36.9 C) (Oral)   Resp 20   Ht 5\' 5"  (1.651 m)   Wt 127 kg   SpO2 100%   BMI 46.59 kg/m   Physical Exam  Constitutional: She is oriented to person,  place, and time. She appears well-developed and well-nourished. No distress.  HENT:  Head: Normocephalic and atraumatic.  Right Ear: Tympanic membrane and ear canal normal.  Left Ear: Tympanic membrane and ear canal normal.  Mouth/Throat: Uvula is midline and mucous membranes are normal. No trismus in the jaw. No uvula swelling. Posterior oropharyngeal erythema present. No oropharyngeal exudate, posterior oropharyngeal edema or tonsillar abscesses. Tonsils are 2+ on the right. Tonsils are 2+ on the left. Tonsillar exudate.  Erythema of the posterior pharynx with edema and exudates of the bilateral tonsils, no PTA  Neck: Normal range of motion. Neck supple.  Cardiovascular: Normal rate, regular rhythm and normal heart sounds.   Pulmonary/Chest: Effort normal and breath sounds normal.  Abdominal: Soft. There is no splenomegaly. There is no tenderness.  Musculoskeletal: Normal range of motion.  Lymphadenopathy:    She has no cervical adenopathy.  Neurological: She is alert and oriented to person, place, and time. She exhibits normal muscle tone. Coordination normal.  Skin: Skin is warm and dry.  Nursing note and vitals reviewed.    ED Treatments / Results  Labs (all labs ordered are listed, but only abnormal results are displayed) Labs Reviewed  RAPID STREP SCREEN (NOT AT [facility])  CULTURE, GROUP A STREP [facility])    EKG  EKG Interpretation None       Radiology No results found.  Procedures Procedures (including critical care time)  Medications Ordered in ED Medications  clindamycin (CLEOCIN) capsule 300 mg (not administered)     Initial Impression / Assessment and Plan / ED Course  I have reviewed the triage vital signs and the nursing notes.  Pertinent labs & imaging results that were available during my care of the patient were reviewed by me and considered in my medical decision making (see chart for details).  Clinical Course     Pt non-toxic appearing.  Vitals  stable.  Exudative tonsillitis, strep negative.  Airway patent.  Pt agrees to tx plan with clinda and magic mouthwash for symptomatic relief.  PCP f/u if needed.  Final Clinical Impressions(s) / ED Diagnoses   Final diagnoses:  Tonsillitis    New Prescriptions Discharge Medication List as of 04/20/2016 11:25 PM    START taking these medications   Details  clindamycin (CLEOCIN) 150 MG capsule Take 2 capsules (300 mg total) by mouth 3 (three) times daily. For 7 days, Starting Mon 04/20/2016, Print    magic mouthwash w/lidocaine SOLN Take 5 mLs by mouth 3 (three) times daily as needed for mouth pain. Swish and spit, do not swallow, Starting Mon 04/20/2016, Print         Kionna Brier [place], [place] 04/24/16 1507    Maia Plan, MD 04/25/16 1046

## 2016-04-20 NOTE — ED Triage Notes (Signed)
Sore throat x 3 days

## 2016-04-20 NOTE — Discharge Instructions (Signed)
Drink plenty of fluids.  Alternate tylenol and ibuprofen every 4-6 hrs for fever.  Follow-up with your doctor or return here for any worsening symptoms

## 2016-04-23 LAB — CULTURE, GROUP A STREP (THRC)

## 2016-06-17 ENCOUNTER — Emergency Department (HOSPITAL_COMMUNITY)
Admission: EM | Admit: 2016-06-17 | Discharge: 2016-06-17 | Disposition: A | Discharge status: Home / Self Care | Payer: Medicaid Other | Attending: Emergency Medicine | Admitting: Emergency Medicine

## 2016-06-17 ENCOUNTER — Encounter (HOSPITAL_COMMUNITY): Payer: Self-pay | Admitting: Emergency Medicine

## 2016-06-17 DIAGNOSIS — N760 Acute vaginitis: Secondary | ICD-10-CM | POA: Insufficient documentation

## 2016-06-17 DIAGNOSIS — L739 Follicular disorder, unspecified: Secondary | ICD-10-CM | POA: Insufficient documentation

## 2016-06-17 DIAGNOSIS — J45909 Unspecified asthma, uncomplicated: Secondary | ICD-10-CM | POA: Insufficient documentation

## 2016-06-17 DIAGNOSIS — I10 Essential (primary) hypertension: Secondary | ICD-10-CM | POA: Insufficient documentation

## 2016-06-17 DIAGNOSIS — B9689 Other specified bacterial agents as the cause of diseases classified elsewhere: Secondary | ICD-10-CM

## 2016-06-17 DIAGNOSIS — Z791 Long term (current) use of non-steroidal anti-inflammatories (NSAID): Secondary | ICD-10-CM | POA: Insufficient documentation

## 2016-06-17 LAB — WET PREP, GENITAL
SPERM: NONE SEEN
Trich, Wet Prep: NONE SEEN
YEAST WET PREP: NONE SEEN

## 2016-06-17 MED ORDER — METRONIDAZOLE 500 MG PO TABS: 500.0000 mg | ORAL_TABLET | Freq: Two times a day (BID) | ORAL | 0 refills | Status: DC

## 2016-06-17 MED ORDER — MUPIROCIN CALCIUM 2 % NA OINT: TOPICAL_OINTMENT | NASAL | 1 refills | Status: DC

## 2016-06-17 NOTE — ED Provider Notes (Signed)
[facility] DE[facility] Provider Note   CSN: [id number] Arrival date & time: 06/17/16  1543     History   Chief Complaint Chief Complaint  Patient presents with  . Vaginal Discharge    HPI Roberta Guerrero is a 21 y.o. female.  Patient questions 2 days of vaginal rash, and discharge. She request evaluation for sexually transmitted disease.    Vaginal Discharge   This is a new problem. The current episode started 2 days ago. The problem occurs daily. The problem has been gradually worsening. The discharge was white and malodorous. She is not pregnant. Pertinent negatives include no abdominal pain, no dysuria and no frequency. She has tried nothing for the symptoms. The treatment provided no relief. Her past medical history does not include irregular periods, PID, ectopic pregnancy or ovarian cysts.    Past Medical History:  Diagnosis Date  . Allergic rhinitis   . Asthma   . Headache   . Hypertension   . Menstrual migraine     Patient Active Problem List   Diagnosis Date Noted  . Nexplanon insertion 07/18/2014  . Essential hypertension, benign 03/23/2014  . Food allergy 07/24/2013  . Obesity, morbid (HCC) 06/14/2013  . 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 Gastritis 02/26/2013  . Eczematous dermatitis of eyelid 09/26/2012  . Eczema 09/26/2012  . Asthma, chronic 08/09/2012  . Allergic rhinitis 08/09/2012  . Rash and nonspecific skin eruption 08/09/2012    Past Surgical History:  Procedure Laterality Date  . CHOLECYSTECTOMY    . MOUTH SURGERY    . SKIN BIOPSY      OB History    No data available       Home Medications    Prior to Admission medications   Medication Sig Start Date End Date Taking? Authorizing Provider  albuterol (PROAIR HFA) 108 (90 Base) MCG/ACT inhaler Inhale 2 puffs into the lungs every 4 (four) hours as needed for wheezing or shortness of breath. 09/02/15   Babs Sciara, MD  beclomethasone (QVAR) 80 MCG/ACT inhaler Inhale 2 puffs into the  lungs 2 (two) times daily. 09/02/15   Babs Sciara, MD  clindamycin (CLEOCIN) 150 MG capsule Take 2 capsules (300 mg total) by mouth 3 (three) times daily. For 7 days 04/20/16   Tammy Triplett, PA-C  EPINEPHrine 0.3 mg/0.3 mL IJ SOAJ injection Inject 0.3 mg into the muscle once.    Historical Provider, MD  fluticasone (FLONASE) 50 MCG/ACT nasal spray Place 2 sprays into both nostrils daily. 07/25/15   Merlyn Albert, MD  ibuprofen (ADVIL,MOTRIN) 600 MG tablet Take 1 tablet (600 mg total) by mouth every 6 (six) hours as needed. Take with food 02/06/16   Tammy Triplett, PA-C  magic mouthwash w/lidocaine SOLN Take 5 mLs by mouth 3 (three) times daily as needed for mouth pain. Swish and spit, do not swallow 04/20/16   Tammy Triplett, PA-C  metoCLOPramide (REGLAN) 10 MG tablet Take 1 tablet (10 mg total) by mouth every 6 (six) hours as needed for nausea (or headache). 10/11/15   Samuel Jester, DO  predniSONE (DELTASONE) 20 MG tablet Take 2 tablets (40 mg total) by mouth daily. 01/12/16   Benjiman Core, MD    Family History Family History  Problem Relation Age of Onset  . Hypertension Mother   . Hypertension Father   . Hyperlipidemia Father   . Hypertension Maternal Grandmother   . Diabetes Maternal Grandmother   . Alcohol abuse Maternal Grandfather     Social History Social History  Substance Use Topics  . Smoking status: Never Smoker  . Smokeless tobacco: Never Used  . Alcohol use No     Allergies   Pineapple; Corn-containing products; and Other   Review of Systems Review of Systems  Constitutional: Negative for activity change.       All ROS Neg except as noted in HPI  HENT: Negative for nosebleeds.   Eyes: Negative for photophobia and discharge.  Respiratory: Negative for cough, shortness of breath and wheezing.   Cardiovascular: Negative for chest pain and palpitations.  Gastrointestinal: Negative for abdominal pain and blood in stool.  Genitourinary: Positive for vaginal  discharge. Negative for dysuria, frequency and hematuria.       Vaginal itching  Musculoskeletal: Negative for arthralgias, back pain and neck pain.  Skin: Negative.   Neurological: Negative for dizziness, seizures and speech difficulty.  Psychiatric/Behavioral: Negative for confusion and hallucinations.     Physical Exam Updated Vital Signs BP 152/92 (BP Location: Right Arm)   Pulse 92   Temp 98.9 F (37.2 C) (Oral)   Resp 16   Ht 5\' 5"  (1.651 m)   Wt 127 kg   SpO2 99%   BMI 46.59 kg/m   Physical Exam  Constitutional: She is oriented to person, place, and time. She appears well-developed and well-nourished.  Non-toxic appearance.  HENT:  Head: Normocephalic.  Right Ear: Tympanic membrane and external ear normal.  Left Ear: Tympanic membrane and external ear normal.  Eyes: EOM and lids are normal. Pupils are equal, round, and reactive to light.  Neck: Normal range of motion. Neck supple. Carotid bruit is not present.  Cardiovascular: Normal rate, regular rhythm, normal heart sounds, intact distal pulses and normal pulses.   Pulmonary/Chest: Breath sounds normal. No respiratory distress.  Abdominal: Soft. Bowel sounds are normal. There is no tenderness. There is no guarding.  Genitourinary:  Genitourinary Comments: Chaperone present during examination.  The patient has increased redness with slightly raised red rash on the right and left outer labia. There are a few scratched areas on the internal labia noted. There is a white to yellow discharge present in the vaginal vault. There is mild increased redness of the cervix. The os of the cervix is closed. There is no wall motion tenderness. There is no adnexal mass or tenderness appreciated.  Musculoskeletal: Normal range of motion.  Lymphadenopathy:       Head (right side): No submandibular adenopathy present.       Head (left side): No submandibular adenopathy present.    She has no cervical adenopathy.  Neurological: She is  alert and oriented to person, place, and time. She has normal strength. No cranial nerve deficit or sensory deficit.  Skin: Skin is warm and dry.  Psychiatric: She has a normal mood and affect. Her speech is normal.  Nursing note and vitals reviewed.    ED Treatments / Results  Labs (all labs ordered are listed, but only abnormal results are displayed) Labs Reviewed  WET PREP, GENITAL - Abnormal; Notable for the following:       Result Value   Clue Cells Wet Prep HPF POC PRESENT (*)    WBC, Wet Prep HPF POC MODERATE (*)    All other components within normal limits  GC/CHLAMYDIA PROBE AMP ([place]) NOT AT [facility]    EKG  EKG Interpretation None       Radiology No results found.  Procedures Procedures (including critical care time)  Medications Ordered in ED Medications - No data  to display   Initial Impression / Assessment and Plan / ED Course  I have reviewed the triage vital signs and the nursing notes.  Pertinent labs & imaging results that were available during my care of the patient were reviewed by me and considered in my medical decision making (see chart for details).     **I have reviewed nursing notes, vital signs, and all appropriate lab and imaging results for this patient.*  Final Clinical Impressions(s) / ED Diagnoses MDM Blood pressure is elevated at 145/93, otherwise the vital signs within normal limits. The rash on the vaginal area is consistent with a folliculitis. It is of note that the patient shaves in this area frequently. Wet prep reveals a bacterial vaginosis. A culture for gonorrhea and chlamydia has been sent to the lab. Prescription for Bactroban for the folliculitis, and Flagyl for the bacterial vaginosis a been given to the patient. Questions a been answered. The patient is in agreement with this discharge plan. I explained to the patient that someone from the flow managers office will call if there are any abnormalities of the gonorrhea or  chlamydia tests.    Final diagnoses:  BV (bacterial vaginosis)  Folliculitis    New Prescriptions New Prescriptions   No medications on file     Ivery QualeHobson Mariacristina Aday, PA-C 06/19/16 0105    Loren Raceravid [place], MD 06/22/16 [phone]

## 2016-06-17 NOTE — Discharge Instructions (Signed)
Your blood pressure is slightly elevated at 152/92, otherwise your vital signs within normal limits. Your oxygen level is 99% on room air. Within normal limits on. Your wet prep examination shows bacterial vaginosis. Please use of Flagyl 2 times daily with a meal. Please do not use anything with alcohol in it. Please use Bactroban to the rash on the external vaginal area. Please refrain from shaving this area or using tight underwear or tight clothing until this infection/inflammation has resolved.

## 2016-06-17 NOTE — ED Triage Notes (Signed)
Notice vaginal rash for last 2 days.

## 2016-06-18 LAB — GC/CHLAMYDIA PROBE AMP (~~LOC~~) NOT AT ARMC
Chlamydia: POSITIVE — AB
Neisseria Gonorrhea: NEGATIVE

## 2016-07-14 ENCOUNTER — Ambulatory Visit (INDEPENDENT_AMBULATORY_CARE_PROVIDER_SITE_OTHER): Payer: Medicaid Other | Admitting: Obstetrics & Gynecology

## 2016-07-14 ENCOUNTER — Encounter: Payer: Self-pay | Admitting: Obstetrics & Gynecology

## 2016-07-14 VITALS — BP 150/90 | HR 94 | Ht 67.0 in | Wt 306.0 lb

## 2016-07-14 DIAGNOSIS — Z3009 Encounter for other general counseling and advice on contraception: Secondary | ICD-10-CM | POA: Diagnosis not present

## 2016-07-14 DIAGNOSIS — Z01419 Encounter for gynecological examination (general) (routine) without abnormal findings: Secondary | ICD-10-CM

## 2016-07-14 LAB — POCT WET PREP (WET MOUNT): TRICHOMONAS WET PREP HPF POC: ABSENT

## 2016-07-14 NOTE — Progress Notes (Signed)
Subjective:     Roberta Guerrero is a 21 y.o. female here for a routine exam.  Patient's last menstrual period was 06/11/2016 (approximate). No obstetric history on file. Birth Control Method:  nexplanon Menstrual Calendar(currently): regular  Current complaints: none.   Current acute medical issues:  asthma   Recent Gynecologic History Patient's last menstrual period was 06/11/2016 (approximate). Last Pap: n/a,   Last mammogram: na,    Past Medical History:  Diagnosis Date  . Allergic rhinitis   . Asthma   . Headache   . Hypertension   . Menstrual migraine     Past Surgical History:  Procedure Laterality Date  . CHOLECYSTECTOMY    . MOUTH SURGERY    . SKIN BIOPSY      OB History    No data available      Social History   Social History  . Marital status: Single    Spouse name: N/A  . Number of children: N/A  . Years of education: N/A   Social History Main Topics  . Smoking status: Never Smoker  . Smokeless tobacco: Never Used  . Alcohol use No  . Drug use: No  . Sexual activity: Yes    Birth control/ protection: Implant   Other Topics Concern  . None   Social History Narrative  . None    Family History  Problem Relation Age of Onset  . Hypertension Mother   . Hypertension Father   . Hyperlipidemia Father   . Hypertension Maternal Grandmother   . Diabetes Maternal Grandmother   . Alcohol abuse Maternal Grandfather      Current Outpatient Prescriptions:  .  albuterol (PROAIR HFA) 108 (90 Base) MCG/ACT inhaler, Inhale 2 puffs into the lungs every 4 (four) hours as needed for wheezing or shortness of breath., Disp: 8.5 Inhaler, Rfl: 1 .  beclomethasone (QVAR) 80 MCG/ACT inhaler, Inhale 2 puffs into the lungs 2 (two) times daily., Disp: 1 Inhaler, Rfl: 3 .  EPINEPHrine 0.3 mg/0.3 mL IJ SOAJ injection, Inject 0.3 mg into the muscle once., Disp: , Rfl:  .  ibuprofen (ADVIL,MOTRIN) 600 MG tablet, Take 1 tablet (600 mg total) by mouth every 6 (six) hours  as needed. Take with food, Disp: 30 tablet, Rfl: 0  Review of Systems  Review of Systems  Constitutional: Negative for fever, chills, weight loss, malaise/fatigue and diaphoresis.  HENT: Negative for hearing loss, ear pain, nosebleeds, congestion, sore throat, neck pain, tinnitus and ear discharge.   Eyes: Negative for blurred vision, double vision, photophobia, pain, discharge and redness.  Respiratory: Negative for cough, hemoptysis, sputum production, shortness of breath, wheezing and stridor.   Cardiovascular: Negative for chest pain, palpitations, orthopnea, claudication, leg swelling and PND.  Gastrointestinal: negative for abdominal pain. Negative for heartburn, nausea, vomiting, diarrhea, constipation, blood in stool and melena.  Genitourinary: Negative for dysuria, urgency, frequency, hematuria and flank pain.  Musculoskeletal: Negative for myalgias, back pain, joint pain and falls.  Skin: Negative for itching and rash.  Neurological: Negative for dizziness, tingling, tremors, sensory change, speech change, focal weakness, seizures, loss of consciousness, weakness and headaches.  Endo/Heme/Allergies: Negative for environmental allergies and polydipsia. Does not bruise/bleed easily.  Psychiatric/Behavioral: Negative for depression, suicidal ideas, hallucinations, memory loss and substance abuse. The patient is not nervous/anxious and does not have insomnia.        Objective:  Blood pressure (!) 150/90, pulse 94, height  (1.702 m), weight (!) 306 lb (138.8 kg), last menstrual period 06/11/2016.  Physical Exam  Vitals reviewed. Constitutional: She is oriented to person, place, and time. She appears well-developed and well-nourished.  HENT:  Head: Normocephalic and atraumatic.        Right Ear: External ear normal.  Left Ear: External ear normal.  Nose: Nose normal.  Mouth/Throat: Oropharynx is clear and moist.  Eyes: Conjunctivae and EOM are normal. Pupils are equal, round,  and reactive to light. Right eye exhibits no discharge. Left eye exhibits no discharge. No scleral icterus.  Neck: Normal range of motion. Neck supple. No tracheal deviation present. No thyromegaly present.  Cardiovascular: Normal rate, regular rhythm, normal heart sounds and intact distal pulses.  Exam reveals no gallop and no friction rub.   No murmur heard. Respiratory: Effort normal and breath sounds normal. No respiratory distress. She has no wheezes. She has no rales. She exhibits no tenderness.  GI: Soft. Bowel sounds are normal. She exhibits no distension and no mass. There is no tenderness. There is no rebound and no guarding.  Genitourinary:  Breasts no masses skin changes or nipple changes bilaterally      Vulva is normal without lesions Vagina is pink moist without discharge Cervix normal in appearance and pap is done Uterus is normal size shape and contour Adnexa is negative with normal sized ovaries   Musculoskeletal: Normal range of motion. She exhibits no edema and no tenderness.  Neurological: She is alert and oriented to person, place, and time. She has normal reflexes. She displays normal reflexes. No cranial nerve deficit. She exhibits normal muscle tone. Coordination normal.  Skin: Skin is warm and dry. No rash noted. No erythema. No pallor.  Psychiatric: She has a normal mood and affect. Her behavior is normal. Judgment and thought content normal.       Medications Ordered at today's visit: No orders of the defined types were placed in this encounter.   Other orders placed at today's visit: Orders Placed This Encounter  Procedures  . GC/Chlamydia Probe Amp  . RPR  . HIV antibody  . CBC  . POCT Wet Prep [facility])      Assessment:    Healthy female exam.    Plan:    Follow up in: 3 years.  for yearly Otherwise,    Return in about 1 week (around 07/21/2016) for removal of nexplanon, , with Dr Despina Hidden.

## 2016-07-15 LAB — CBC
HEMOGLOBIN: 11.8 g/dL (ref 11.1–15.9)
Hematocrit: 37 % (ref 34.0–46.6)
MCH: 25.8 pg — AB (ref 26.6–33.0)
MCHC: 31.9 g/dL (ref 31.5–35.7)
MCV: 81 fL (ref 79–97)
PLATELETS: 300 10*3/uL (ref 150–379)
RBC: 4.58 x10E6/uL (ref 3.77–5.28)
RDW: 14.2 % (ref 12.3–15.4)
WBC: 7.6 10*3/uL (ref 3.4–10.8)

## 2016-07-15 LAB — HIV ANTIBODY (ROUTINE TESTING W REFLEX): HIV SCREEN 4TH GENERATION: NONREACTIVE

## 2016-07-15 LAB — RPR: RPR: NONREACTIVE

## 2016-07-16 LAB — GC/CHLAMYDIA PROBE AMP
Chlamydia trachomatis, NAA: NEGATIVE
Neisseria gonorrhoeae by PCR: NEGATIVE

## 2016-07-21 ENCOUNTER — Ambulatory Visit (INDEPENDENT_AMBULATORY_CARE_PROVIDER_SITE_OTHER): Payer: Medicaid Other | Admitting: Obstetrics & Gynecology

## 2016-07-21 ENCOUNTER — Encounter: Payer: Self-pay | Admitting: Obstetrics & Gynecology

## 2016-07-21 VITALS — BP 128/78 | HR 94 | Wt 307.0 lb

## 2016-07-21 DIAGNOSIS — Z3049 Encounter for surveillance of other contraceptives: Secondary | ICD-10-CM

## 2016-07-21 DIAGNOSIS — Z3046 Encounter for surveillance of implantable subdermal contraceptive: Secondary | ICD-10-CM

## 2016-07-21 NOTE — Progress Notes (Signed)
nexplanon removal:  Left arm examined and without lesions and the implant is palpated Prepped with betadine 1% lidocaine 3 cc is injected 11 blade stab incision is made Hemostat used and the nexplanon is removed without difficulty Steri strips placed Bandage applied After care instructions given

## 2016-08-05 ENCOUNTER — Encounter (HOSPITAL_COMMUNITY): Payer: Self-pay | Admitting: Emergency Medicine

## 2016-08-05 ENCOUNTER — Emergency Department (HOSPITAL_COMMUNITY)
Admission: EM | Admit: 2016-08-05 | Discharge: 2016-08-05 | Disposition: A | Discharge status: Home / Self Care | Payer: Self-pay | Attending: Emergency Medicine | Admitting: Emergency Medicine

## 2016-08-05 DIAGNOSIS — Z79899 Other long term (current) drug therapy: Secondary | ICD-10-CM | POA: Insufficient documentation

## 2016-08-05 DIAGNOSIS — J45909 Unspecified asthma, uncomplicated: Secondary | ICD-10-CM | POA: Insufficient documentation

## 2016-08-05 DIAGNOSIS — G43009 Migraine without aura, not intractable, without status migrainosus: Secondary | ICD-10-CM | POA: Insufficient documentation

## 2016-08-05 DIAGNOSIS — I10 Essential (primary) hypertension: Secondary | ICD-10-CM | POA: Insufficient documentation

## 2016-08-05 MED ORDER — DIPHENHYDRAMINE HCL 50 MG/ML IJ SOLN
25.0000 mg | Freq: Once | INTRAMUSCULAR | Status: AC
  Administered 2016-08-05: 25 mg via INTRAVENOUS
  Filled 2016-08-05: qty 1

## 2016-08-05 MED ORDER — KETOROLAC TROMETHAMINE 30 MG/ML IJ SOLN
30.0000 mg | Freq: Once | INTRAMUSCULAR | Status: AC
  Administered 2016-08-05: 30 mg via INTRAVENOUS
  Filled 2016-08-05: qty 1

## 2016-08-05 MED ORDER — BUTALBITAL-APAP-CAFF-COD 50-325-40-30 MG PO CAPS: 1.0000 | ORAL_CAPSULE | ORAL | 0 refills | Status: DC | PRN

## 2016-08-05 MED ORDER — ONDANSETRON HCL 4 MG PO TABS: 4.0000 mg | ORAL_TABLET | Freq: Four times a day (QID) | ORAL | 0 refills | Status: DC

## 2016-08-05 MED ORDER — PROCHLORPERAZINE EDISYLATE 5 MG/ML IJ SOLN
5.0000 mg | Freq: Once | INTRAMUSCULAR | Status: AC
  Administered 2016-08-05: 5 mg via INTRAVENOUS
  Filled 2016-08-05: qty 2

## 2016-08-05 NOTE — ED Provider Notes (Signed)
[facility]  [facility] Provider Note   CSN: [id number] Arrival date & time: 08/05/16  1854     History   Chief Complaint Chief Complaint  Patient presents with  . Migraine    HPI Roberta Guerrero is a 21 y.o. female.  Patient is a 21 year old female who presents to the emergency department with a complaint of a migraine headache.  The patient states that while she was at work at approximately 2:00 PM today she began to have pain in the temporal areas, accompanied by pressure in the 4 head and radiating to the back of the head. This was accompanied also by one episode of nausea vomiting. She states that she felt as though the vision was changing in her peripheral vision and she came to the emergency department for assistance with this problem. The patient has had a history of migraine headaches in the past, but she states this one is worse than usual. She's not had any change in mental status. No loss of control of extremities. She's not had any injury or trauma to the head.      Past Medical History:  Diagnosis Date  . Allergic rhinitis   . Asthma   . Headache   . Hypertension   . Menstrual migraine     Patient Active Problem List   Diagnosis Date Noted  . Nexplanon insertion 07/18/2014  . Essential hypertension, benign 03/23/2014  . Food allergy 07/24/2013  . Obesity, morbid (HCC) 06/14/2013  . 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 Gastritis 02/26/2013  . Eczematous dermatitis of eyelid 09/26/2012  . Eczema 09/26/2012  . Asthma, chronic 08/09/2012  . Allergic rhinitis 08/09/2012  . Rash and nonspecific skin eruption 08/09/2012    Past Surgical History:  Procedure Laterality Date  . CHOLECYSTECTOMY    . MOUTH SURGERY    . SKIN BIOPSY      OB History    No data available       Home Medications    Prior to Admission medications   Medication Sig Start Date End Date Taking? Authorizing Provider  albuterol (PROAIR HFA) 108 (90 Base) MCG/ACT inhaler Inhale 2  puffs into the lungs every 4 (four) hours as needed for wheezing or shortness of breath. 09/02/15   Babs Sciara, MD  beclomethasone (QVAR) 80 MCG/ACT inhaler Inhale 2 puffs into the lungs 2 (two) times daily. 09/02/15   Babs Sciara, MD  EPINEPHrine 0.3 mg/0.3 mL IJ SOAJ injection Inject 0.3 mg into the muscle once.    Historical Provider, MD  ibuprofen (ADVIL,MOTRIN) 600 MG tablet Take 1 tablet (600 mg total) by mouth every 6 (six) hours as needed. Take with food 02/06/16   Tammy Triplett, PA-C    Family History Family History  Problem Relation Age of Onset  . Hypertension Mother   . Hypertension Father   . Hyperlipidemia Father   . Hypertension Maternal Grandmother   . Diabetes Maternal Grandmother   . Alcohol abuse Maternal Grandfather     Social History Social History  Substance Use Topics  . Smoking status: Never Smoker  . Smokeless tobacco: Never Used  . Alcohol use No     Allergies   Pineapple; Corn-containing products; and Other   Review of Systems Review of Systems  Constitutional: Positive for activity change.       All ROS Neg except as noted in HPI  HENT: Negative for nosebleeds.   Eyes: Negative for photophobia and discharge.  Respiratory: Negative for cough, shortness of breath  and wheezing.   Cardiovascular: Negative for chest pain and palpitations.  Gastrointestinal: Negative for abdominal pain and blood in stool.  Genitourinary: Negative for dysuria, frequency and hematuria.  Musculoskeletal: Negative for arthralgias, back pain and neck pain.  Skin: Negative.   Neurological: Positive for headaches. Negative for dizziness, seizures and speech difficulty.  Psychiatric/Behavioral: Negative for confusion and hallucinations.     Physical Exam Updated Vital Signs BP (!) 158/86 (BP Location: Right Arm)   Pulse 65   Temp 97.8 F (36.6 C) (Oral)   Resp 14   Ht  (1.676 m)   Wt (!) 138.8 kg   SpO2 98%   BMI 49.39 kg/m   Physical Exam    Constitutional: She is oriented to person, place, and time. She appears well-developed and well-nourished.  Non-toxic appearance.  HENT:  Head: Normocephalic.  Right Ear: Tympanic membrane and external ear normal.  Left Ear: Tympanic membrane and external ear normal.  Eyes: EOM and lids are normal. Pupils are equal, round, and reactive to light.  Neck: Normal range of motion. Neck supple. Carotid bruit is not present.  Cardiovascular: Normal rate, regular rhythm, normal heart sounds, intact distal pulses and normal pulses.   Pulmonary/Chest: Breath sounds normal. No respiratory distress.  Abdominal: Soft. Bowel sounds are normal. There is no tenderness. There is no guarding.  Musculoskeletal: Normal range of motion.  Lymphadenopathy:       Head (right side): No submandibular adenopathy present.       Head (left side): No submandibular adenopathy present.    She has no cervical adenopathy.  Neurological: She is alert and oriented to person, place, and time. She has normal strength. No cranial nerve deficit or sensory deficit. Coordination normal.  Skin: Skin is warm and dry.  Psychiatric: She has a normal mood and affect. Her speech is normal.  Nursing note and vitals reviewed.    ED Treatments / Results  Labs (all labs ordered are listed, but only abnormal results are displayed) Labs Reviewed - No data to display  EKG  EKG Interpretation None       Radiology No results found.  Procedures Procedures (including critical care time)  Medications Ordered in ED Medications  prochlorperazine (COMPAZINE) injection 5 mg (not administered)  diphenhydrAMINE (BENADRYL) injection 25 mg (not administered)  ketorolac (TORADOL) 30 MG/ML injection 30 mg (not administered)     Initial Impression / Assessment and Plan / ED Course  I have reviewed the triage vital signs and the nursing notes.  Pertinent labs & imaging results that were available during my care of the patient were  reviewed by me and considered in my medical decision making (see chart for details).     Final Clinical Impressions(s) / ED Diagnoses MDM Patient presents to the emergency department with complaint of migraine headache pain. There no gross neurologic deficits noted on the examination. Patient treated in the emergency department with intravenous Toradol, Benadryl, Compazine cocktail.  Recheck patient states headache feels much better. The patient feel she can manage the headache at home at this time. There no gross neurologic deficits appreciated no significant change in the examination.  Prescription for her fioricet- codeine given to the patient as well as prescription for Zofran. The patient is to follow-up with her primary physician to discuss breakthrough migraine headaches. The patient will return to the emergency department if any emergent changes, problems, or concerns.   Pt checked on the [place] Controlled substance reporting system.   Final diagnoses:  Migraine  without aura and without status migrainosus, not intractable    New Prescriptions New Prescriptions   No medications on file     Ivery Quale, Cordelia Poche 08/05/16 2122    Bethann Berkshire, MD 08/07/16 [phone]

## 2016-08-05 NOTE — ED Triage Notes (Signed)
Headache started at 1400 today she had nausea and vomiting.

## 2016-08-05 NOTE — Discharge Instructions (Signed)
Your vital signs are within normal limits. There no acute neurologic deficits appreciated on your examination at this time. Please use Tylenol, or ibuprofen for mild pain, use Fioricet-codeine for more severe pain. Please discuss this medication with your primary physician, certainly can be made a part of your medical record. This medication may cause drowsiness, please use with caution.

## 2016-10-05 ENCOUNTER — Encounter (HOSPITAL_COMMUNITY): Payer: Self-pay | Admitting: Emergency Medicine

## 2016-10-05 ENCOUNTER — Emergency Department (HOSPITAL_COMMUNITY)
Admission: EM | Admit: 2016-10-05 | Discharge: 2016-10-05 | Disposition: A | Discharge status: Home / Self Care | Payer: Self-pay | Attending: Emergency Medicine | Admitting: Emergency Medicine

## 2016-10-05 DIAGNOSIS — J020 Streptococcal pharyngitis: Secondary | ICD-10-CM | POA: Insufficient documentation

## 2016-10-05 DIAGNOSIS — J45909 Unspecified asthma, uncomplicated: Secondary | ICD-10-CM | POA: Insufficient documentation

## 2016-10-05 DIAGNOSIS — I10 Essential (primary) hypertension: Secondary | ICD-10-CM | POA: Insufficient documentation

## 2016-10-05 MED ORDER — PENICILLIN G BENZATHINE 1200000 UNIT/2ML IM SUSP
1.2000 10*6.[IU] | Freq: Once | INTRAMUSCULAR | Status: AC
  Administered 2016-10-05: 1.2 10*6.[IU] via INTRAMUSCULAR
  Filled 2016-10-05: qty 2

## 2016-10-05 MED ORDER — IBUPROFEN 800 MG PO TABS: 800.0000 mg | ORAL_TABLET | Freq: Three times a day (TID) | ORAL | 0 refills | Status: DC

## 2016-10-05 NOTE — ED Triage Notes (Signed)
Pt c/o sore throat x2 days 

## 2016-10-05 NOTE — ED Notes (Signed)
Pt did request shot of antibiotics if antibiotics are needed.

## 2016-10-05 NOTE — Discharge Instructions (Signed)
Rest and make sure you are drinking plenty of fluids. You may find ibuprofen to help a lot with sore throat pain.

## 2016-10-09 NOTE — ED Provider Notes (Signed)
[facility] Provider Note   CSN: [id number] Arrival date & time: 10/05/16  2120     History   Chief Complaint Chief Complaint  Patient presents with  . Sore Throat    HPI Roberta Guerrero is a 21 y.o. female presenting with a 2 day history of sore throat with tonsillar swelling and white patches, subjective fevers and chills. She endorses positive exposure to strep throat.  She denies cough, nasal congestion or drainage, does endorse bilateral ear pain when swallowing.  She has taken tylenol for fever reduction without significant improvement in pain.  The history is provided by the patient.  Sore Throat  Pertinent negatives include no chest pain, no abdominal pain and no shortness of breath.    Past Medical History:  Diagnosis Date  . Allergic rhinitis   . Asthma   . Headache   . Hypertension   . Menstrual migraine     Patient Active Problem List   Diagnosis Date Noted  . Nexplanon insertion 07/18/2014  . Essential hypertension, benign 03/23/2014  . Food allergy 07/24/2013  . Obesity, morbid (HCC) 06/14/2013  . 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 Gastritis 02/26/2013  . Eczematous dermatitis of eyelid 09/26/2012  . Eczema 09/26/2012  . Asthma, chronic 08/09/2012  . Allergic rhinitis 08/09/2012  . Rash and nonspecific skin eruption 08/09/2012    Past Surgical History:  Procedure Laterality Date  . CHOLECYSTECTOMY    . MOUTH SURGERY    . SKIN BIOPSY      OB History    No data available       Home Medications    Prior to Admission medications   Medication Sig Start Date End Date Taking? Authorizing Provider  albuterol (PROAIR HFA) 108 (90 Base) MCG/ACT inhaler Inhale 2 puffs into the lungs every 4 (four) hours as needed for wheezing or shortness of breath. 09/02/15   Babs SciaraLuking, Scott A, MD  beclomethasone (QVAR) 80 MCG/ACT inhaler Inhale 2 puffs into the lungs 2 (two) times daily. 09/02/15   Babs SciaraLuking, Scott A, MD  butalbital-acetaminophen-caffeine  (FIORICET/CODEINE) 830143419250-325-40-30 MG capsule Take 1 capsule by mouth every 4 (four) hours as needed for headache. 08/05/16   Ivery QualeBryant, Hobson, PA-C  EPINEPHrine 0.3 mg/0.3 mL IJ SOAJ injection Inject 0.3 mg into the muscle once.    [provider]  ibuprofen (ADVIL,MOTRIN) 800 MG tablet Take 1 tablet (800 mg total) by mouth 3 (three) times daily. 10/05/16   Burgess AmorIdol, Domanic Matusek, PA-C  ondansetron (ZOFRAN) 4 MG tablet Take 1 tablet (4 mg total) by mouth every 6 (six) hours. 08/05/16   Ivery QualeBryant, Hobson, PA-C    Family History Family History  Problem Relation Age of Onset  . Hypertension Mother   . Hypertension Father   . Hyperlipidemia Father   . Hypertension Maternal Grandmother   . Diabetes Maternal Grandmother   . Alcohol abuse Maternal Grandfather     Social History Social History  Substance Use Topics  . Smoking status: Never Smoker  . Smokeless tobacco: Never Used  . Alcohol use No     Allergies   Pineapple; Corn-containing products; and Other   Review of Systems Review of Systems  Constitutional: Positive for fever. Negative for chills.  HENT: Positive for ear pain and sore throat. Negative for congestion, rhinorrhea, sinus pressure, trouble swallowing and voice change.   Eyes: Negative for discharge.  Respiratory: Negative for cough, shortness of breath, wheezing and stridor.   Cardiovascular: Negative for chest pain.  Gastrointestinal: Negative for abdominal pain.  Genitourinary:  Negative.      Physical Exam Updated Vital Signs BP (!) 145/92   Pulse 92   Temp 98.9 F (37.2 C)   Resp 16   Ht 5\' 5"  (1.651 m)   Wt 135.2 kg (298 lb)   LMP 09/09/2016   SpO2 98%   BMI 49.59 kg/m   Physical Exam  Constitutional: She is oriented to person, place, and time. She appears well-developed and well-nourished.  HENT:  Head: Normocephalic and atraumatic.  Right Ear: Tympanic membrane and ear canal normal.  Left Ear: Tympanic membrane and ear canal normal.  Nose: No mucosal  edema or rhinorrhea.  Mouth/Throat: Uvula is midline and mucous membranes are normal. Oropharyngeal exudate and posterior oropharyngeal erythema present. No posterior oropharyngeal edema or tonsillar abscesses. Tonsils are 2+ on the right. Tonsils are 2+ on the left. Tonsillar exudate.  Eyes: Conjunctivae are normal.  Cardiovascular: Normal rate and normal heart sounds.   Pulmonary/Chest: Effort normal. No respiratory distress. She has no wheezes. She has no rales.  Abdominal: Soft. There is no tenderness.  Musculoskeletal: Normal range of motion.  Neurological: She is alert and oriented to person, place, and time.  Skin: Skin is warm and dry. No rash noted.  Psychiatric: She has a normal mood and affect.     ED Treatments / Results  Labs (all labs ordered are listed, but only abnormal results are displayed) Labs Reviewed - No data to display  EKG  EKG Interpretation None       Radiology No results found.  Procedures Procedures (including critical care time)  Medications Ordered in ED Medications  penicillin g benzathine (BICILLIN LA) 1200000 UNIT/2ML injection 1.2 Million Units (1.2 Million Units Intramuscular Given 10/05/16 2244)     Initial Impression / Assessment and Plan / ED Course  I have reviewed the triage vital signs and the nursing notes.  Pertinent labs & imaging results that were available during my care of the patient were reviewed by me and considered in my medical decision making (see chart for details).     Pt with exam c/w strep pharyngitis with known exposure.  She was treated with bicillin as she preferred to this to oral meds.  Plan rest, fluids, ibuprofen. Recheck by pcp if not improving. No trismus, no peritonsillar abscess.  Final Clinical Impressions(s) / ED Diagnoses   Final diagnoses:  Strep throat    New Prescriptions Discharge Medication List as of 10/05/2016 10:42 PM       Burgess Amor, PA-C 10/09/16 1100    Linwood Dibbles,  MD 10/11/16 [phone]

## 2016-10-19 ENCOUNTER — Ambulatory Visit: Payer: Medicaid Other | Admitting: Obstetrics & Gynecology

## 2016-12-01 ENCOUNTER — Encounter: Payer: Self-pay | Admitting: Women's Health

## 2016-12-01 ENCOUNTER — Ambulatory Visit (INDEPENDENT_AMBULATORY_CARE_PROVIDER_SITE_OTHER): Payer: Medicaid Other | Admitting: Women's Health

## 2016-12-01 VITALS — BP 140/80 | HR 80 | Wt 305.0 lb

## 2016-12-01 DIAGNOSIS — Z3202 Encounter for pregnancy test, result negative: Secondary | ICD-10-CM

## 2016-12-01 DIAGNOSIS — Z113 Encounter for screening for infections with a predominantly sexual mode of transmission: Secondary | ICD-10-CM

## 2016-12-01 DIAGNOSIS — Z30017 Encounter for initial prescription of implantable subdermal contraceptive: Secondary | ICD-10-CM

## 2016-12-01 DIAGNOSIS — Z3049 Encounter for surveillance of other contraceptives: Secondary | ICD-10-CM

## 2016-12-01 DIAGNOSIS — I10 Essential (primary) hypertension: Secondary | ICD-10-CM

## 2016-12-01 LAB — POCT URINE PREGNANCY: PREG TEST UR: NEGATIVE

## 2016-12-01 MED ORDER — AMLODIPINE BESYLATE 5 MG PO TABS: 5.0000 mg | ORAL_TABLET | Freq: Every day | ORAL | 11 refills | Status: DC

## 2016-12-01 MED ORDER — HYDROCHLOROTHIAZIDE 12.5 MG PO CAPS: 12.5000 mg | ORAL_CAPSULE | Freq: Every day | ORAL | 11 refills | Status: DC

## 2016-12-01 MED ORDER — ETONOGESTREL 68 MG ~~LOC~~ IMPL
68.0000 mg | DRUG_IMPLANT | Freq: Once | SUBCUTANEOUS | Status: AC
  Administered 2016-12-01: 68 mg via SUBCUTANEOUS

## 2016-12-01 NOTE — Progress Notes (Signed)
Roberta Guerrero is a 20 y.o. year old [place] American female here for Nexplanon insertion.  Patient's last menstrual period was 11/08/2016., last sexual intercourse was >2wks ago, and her pregnancy test today was negative.  Risks/benefits/side effects of Nexplanon have been discussed and her questions have been answered.  Specifically, a failure rate of 04/998 has been reported, with an increased failure rate if pt takes St. John's Wort and/or antiseizure medicaitons.  [name] is aware of the common side effect of irregular bleeding, which the incidence of decreases over time. Pt had Nexplanon removed in April, states she was not in a relationship at that time, so didn't need contraception, and she thought it was causing 'weird things' w/ her body, like headaches, etc. Is in a new relationship now, having sex. Has been using condoms.  Dx w/ CHTN @ 21yo 'that's what they say', was placed on meds, states she stopped taking long ago. Discussed she definitely has CHTN, all documented bp's in EPIC are elevated, discussed importance of being on antihypertensive. Hasn't been to PCP in >15yr d/t having [facility] only, so won't cover visits, etc. States she will take bp med if I send it in. Her grandma is a Engineer, civil (consulting), and states she can check her bp for her. Discussed w/ LHE>he recommends to rx norvasc 5mg  & hctz 12.5mg  d/t AA, morbidly obese. Both of these sent in today, gave alert for hctz that corn products give pt rash- discussed w/ LHE-ok to continue w/ hctz. Pt unable to f/u just for bp check d/t [facility] not covering, so will plan pap when turns 21yo on 10/31 and bp check at that time.   BP 140/80   Pulse 80   Wt (!) 305 lb (138.3 kg)   LMP 11/08/2016   BMI 50.75 kg/m   Results for orders placed or performed in visit on 12/01/16 (from the past 24 hour(s))  POCT urine pregnancy   Collection Time: 12/01/16  3:50 PM  Result Value Ref Range   Preg Test, Ur Negative Negative     She is right-handed, so  her left arm, approximately 4 inches proximal from the elbow, was cleansed with alcohol and anesthetized with 2cc of 2% Lidocaine.  The area was cleansed again with betadine and the Nexplanon was inserted per manufacturer's recommendations without difficulty.  3 steri-strips and pressure bandage were applied.  Pt was instructed to keep the area clean and dry, remove pressure bandage in 24 hours, and keep insertion site covered with the steri-strip for 3-5 days.  Back up contraception was recommended for 2 weeks.  She was given a card indicating date Nexplanon was inserted and date it needs to be removed. Follow-up PRN problems, then 10/31 for pap & physical and bp check.   Marge Duncans CNM, [facility] 12/01/2016 4:27 PM

## 2016-12-01 NOTE — Patient Instructions (Signed)
Keep the area clean and dry.  You can remove the big bandage in 24 hours, and the small steri-strip bandage in 3-5 days.  A back up method, such as condoms, should be used for two weeks. You may have irregular vaginal bleeding for the first 6 months after the Nexplanon is placed, then the bleeding usually lightens and it is possible that you may not have any periods.  If you have any concerns, please give us a call.    Etonogestrel implant What is this medicine? ETONOGESTREL (et oh noe JES trel) is a contraceptive (birth control) device. It is used to prevent pregnancy. It can be used for up to 3 years. This medicine may be used for other purposes; ask your health care provider or pharmacist if you have questions. COMMON BRAND NAME(S): Implanon, Nexplanon What should I tell my health care provider before I take this medicine? They need to know if you have any of these conditions: -abnormal vaginal bleeding -blood vessel disease or blood clots -cancer of the breast, cervix, or liver -depression -diabetes -gallbladder disease -headaches -heart disease or recent heart attack -high blood pressure -high cholesterol -kidney disease -liver disease -renal disease -seizures -tobacco smoker -an unusual or allergic reaction to etonogestrel, other hormones, anesthetics or antiseptics, medicines, foods, dyes, or preservatives -pregnant or trying to get pregnant -breast-feeding How should I use this medicine? This device is inserted just under the skin on the inner side of your upper arm by a health care professional. Talk to your pediatrician regarding the use of this medicine in children. Special care may be needed. Overdosage: If you think you have taken too much of this medicine contact a poison control center or emergency room at once. NOTE: This medicine is only for you. Do not share this medicine with others. What if I miss a dose? This does not apply. What may interact with this  medicine? Do not take this medicine with any of the following medications: -amprenavir -bosentan -fosamprenavir This medicine may also interact with the following medications: -barbiturate medicines for inducing sleep or treating seizures -certain medicines for fungal infections like ketoconazole and itraconazole -grapefruit juice -griseofulvin -medicines to treat seizures like carbamazepine, felbamate, oxcarbazepine, phenytoin, topiramate -modafinil -phenylbutazone -rifampin -rufinamide -some medicines to treat HIV infection like atazanavir, indinavir, lopinavir, nelfinavir, tipranavir, ritonavir -St. John's wort This list may not describe all possible interactions. Give your health care provider a list of all the medicines, herbs, non-prescription drugs, or dietary supplements you use. Also tell them if you smoke, drink alcohol, or use illegal drugs. Some items may interact with your medicine. What should I watch for while using this medicine? This product does not protect you against HIV infection (AIDS) or other sexually transmitted diseases. You should be able to feel the implant by pressing your fingertips over the skin where it was inserted. Contact your doctor if you cannot feel the implant, and use a non-hormonal birth control method (such as condoms) until your doctor confirms that the implant is in place. If you feel that the implant may have broken or become bent while in your arm, contact your healthcare provider. What side effects may I notice from receiving this medicine? Side effects that you should report to your doctor or health care professional as soon as possible: -allergic reactions like skin rash, itching or hives, swelling of the face, lips, or tongue -breast lumps -changes in emotions or moods -depressed mood -heavy or prolonged menstrual bleeding -pain, irritation, swelling, or bruising at   the insertion site -scar at site of insertion -signs of infection at the  insertion site such as fever, and skin redness, pain or discharge -signs of pregnancy -signs and symptoms of a blood clot such as breathing problems; changes in vision; chest pain; severe, sudden headache; pain, swelling, warmth in the leg; trouble speaking; sudden numbness or weakness of the face, arm or leg -signs and symptoms of liver injury like dark yellow or brown urine; general ill feeling or flu-like symptoms; light-colored stools; loss of appetite; nausea; right upper belly pain; unusually weak or tired; yellowing of the eyes or skin -unusual vaginal bleeding, discharge -signs and symptoms of a stroke like changes in vision; confusion; trouble speaking or understanding; severe headaches; sudden numbness or weakness of the face, arm or leg; trouble walking; dizziness; loss of balance or coordination Side effects that usually do not require medical attention (report to your doctor or health care professional if they continue or are bothersome): -acne -back pain -breast pain -changes in weight -dizziness -general ill feeling or flu-like symptoms -headache -irregular menstrual bleeding -nausea -sore throat -vaginal irritation or inflammation This list may not describe all possible side effects. Call your doctor for medical advice about side effects. You may report side effects to FDA at 1-800-FDA-1088. Where should I keep my medicine? This drug is given in a hospital or clinic and will not be stored at home. NOTE: This sheet is a summary. It may not cover all possible information. If you have questions about this medicine, talk to your doctor, pharmacist, or health care provider.  2018 Elsevier/Gold Standard (2015-10-17 11:19:22)   Hypertension Hypertension, commonly called high blood pressure, is when the force of blood pumping through the arteries is too strong. The arteries are the blood vessels that carry blood from the heart throughout the body. Hypertension forces the heart to  work harder to pump blood and may cause arteries to become narrow or stiff. Having untreated or uncontrolled hypertension can cause heart attacks, strokes, kidney disease, and other problems. A blood pressure reading consists of a higher number over a lower number. Ideally, your blood pressure should be below 120/80. The first ("top") number is called the systolic pressure. It is a measure of the pressure in your arteries as your heart beats. The second ("bottom") number is called the diastolic pressure. It is a measure of the pressure in your arteries as the heart relaxes. What are the causes? The cause of this condition is not known. What increases the risk? Some risk factors for high blood pressure are under your control. Others are not. Factors you can change  Smoking.  Having type 2 diabetes mellitus, high cholesterol, or both.  Not getting enough exercise or physical activity.  Being overweight.  Having too much fat, sugar, calories, or salt (sodium) in your diet.  Drinking too much alcohol. Factors that are difficult or impossible to change  Having chronic kidney disease.  Having a family history of high blood pressure.  Age. Risk increases with age.  Race. You may be at higher risk if you are African-American.  Gender. Men are at higher risk than women before age 35. After age 57, women are at higher risk than men.  Having obstructive sleep apnea.  Stress. What are the signs or symptoms? Extremely high blood pressure (hypertensive crisis) may cause:  Headache.  Anxiety.  Shortness of breath.  Nosebleed.  Nausea and vomiting.  Severe chest pain.  Jerky movements you cannot control (seizures).  How is  this diagnosed? This condition is diagnosed by measuring your blood pressure while you are seated, with your arm resting on a surface. The cuff of the blood pressure monitor will be placed directly against the skin of your upper arm at the level of your heart.  It should be measured at least twice using the same arm. Certain conditions can cause a difference in blood pressure between your right and left arms. Certain factors can cause blood pressure readings to be lower or higher than normal (elevated) for a short period of time:  When your blood pressure is higher when you are in a health care provider's office than when you are at home, this is called white coat hypertension. Most people with this condition do not need medicines.  When your blood pressure is higher at home than when you are in a health care provider's office, this is called masked hypertension. Most people with this condition may need medicines to control blood pressure.  If you have a high blood pressure reading during one visit or you have normal blood pressure with other risk factors:  You may be asked to return on a different day to have your blood pressure checked again.  You may be asked to monitor your blood pressure at home for 1 week or longer.  If you are diagnosed with hypertension, you may have other blood or imaging tests to help your health care provider understand your overall risk for other conditions. How is this treated? This condition is treated by making healthy lifestyle changes, such as eating healthy foods, exercising more, and reducing your alcohol intake. Your health care provider may prescribe medicine if lifestyle changes are not enough to get your blood pressure under control, and if:  Your systolic blood pressure is above 130.  Your diastolic blood pressure is above 80.  Your personal target blood pressure may vary depending on your medical conditions, your age, and other factors. Follow these instructions at home: Eating and drinking  Eat a diet that is high in fiber and potassium, and low in sodium, added sugar, and fat. An example eating plan is called the DASH (Dietary Approaches to Stop Hypertension) diet. To eat this way: ? Eat plenty of fresh  fruits and vegetables. Try to fill half of your plate at each meal with fruits and vegetables. ? Eat whole grains, such as whole wheat pasta, brown rice, or whole grain bread. Fill about one quarter of your plate with whole grains. ? Eat or drink low-fat dairy products, such as skim milk or low-fat yogurt. ? Avoid fatty cuts of meat, processed or cured meats, and poultry with skin. Fill about one quarter of your plate with lean proteins, such as fish, chicken without skin, beans, eggs, and tofu. ? Avoid premade and processed foods. These tend to be higher in sodium, added sugar, and fat.  Reduce your daily sodium intake. Most people with hypertension should eat less than 1,500 mg of sodium a day.  Limit alcohol intake to no more than 1 drink a day for nonpregnant women and 2 drinks a day for men. One drink equals 12 oz of beer, 5 oz of wine, or 1 oz of hard liquor. Lifestyle  Work with your health care provider to maintain a healthy body weight or to lose weight. Ask what an ideal weight is for you.  Get at least 30 minutes of exercise that causes your heart to beat faster (aerobic exercise) most days of the week. Activities may  include walking, swimming, or biking.  Include exercise to strengthen your muscles (resistance exercise), such as pilates or lifting weights, as part of your weekly exercise routine. Try to do these types of exercises for 30 minutes at least 3 days a week.  Do not use any products that contain nicotine or tobacco, such as cigarettes and e-cigarettes. If you need help quitting, ask your health care provider.  Monitor your blood pressure at home as told by your health care provider.  Keep all follow-up visits as told by your health care provider. This is important. Medicines  Take over-the-counter and prescription medicines only as told by your health care provider. Follow directions carefully. Blood pressure medicines must be taken as prescribed.  Do not skip doses  of blood pressure medicine. Doing this puts you at risk for problems and can make the medicine less effective.  Ask your health care provider about side effects or reactions to medicines that you should watch for. Contact a health care provider if:  You think you are having a reaction to a medicine you are taking.  You have headaches that keep coming back (recurring).  You feel dizzy.  You have swelling in your ankles.  You have trouble with your vision. Get help right away if:  You develop a severe headache or confusion.  You have unusual weakness or numbness.  You feel faint.  You have severe pain in your chest or abdomen.  You vomit repeatedly.  You have trouble breathing. Summary  Hypertension is when the force of blood pumping through your arteries is too strong. If this condition is not controlled, it may put you at risk for serious complications.  Your personal target blood pressure may vary depending on your medical conditions, your age, and other factors. For most people, a normal blood pressure is less than 120/80.  Hypertension is treated with lifestyle changes, medicines, or a combination of both. Lifestyle changes include weight loss, eating a healthy, low-sodium diet, exercising more, and limiting alcohol. This information is not intended to replace advice given to you by your health care provider. Make sure you discuss any questions you have with your health care provider. Document Released: 03/30/2005 Document Revised: 02/26/2016 Document Reviewed: 02/26/2016 Elsevier Interactive Patient Education  [employer].

## 2016-12-02 LAB — GC/CHLAMYDIA PROBE AMP
Chlamydia trachomatis, NAA: NEGATIVE
NEISSERIA GONORRHOEAE BY PCR: NEGATIVE

## 2016-12-29 ENCOUNTER — Telehealth: Payer: Self-pay | Admitting: *Deleted

## 2016-12-29 NOTE — Telephone Encounter (Signed)
LMOVM to just monitor. If any signs of infection (redness, swelling, pain) needs to be seen. Verbalized understanding.

## 2016-12-29 NOTE — Telephone Encounter (Signed)
Patient called with complaints of "burning" at the area where the Nexplanon was inserted on 8/21. It does not occur daily just every once in a while along with feeling like it wants to get "stuck" in her clothes. Informed patient that it can take some time for the implant to get settled. Please advise if patient needs to be concerned about burning.

## 2017-02-10 ENCOUNTER — Other Ambulatory Visit: Payer: Medicaid Other | Admitting: Women's Health

## 2017-02-10 ENCOUNTER — Encounter: Payer: Self-pay | Admitting: *Deleted

## 2017-05-14 ENCOUNTER — Emergency Department (HOSPITAL_COMMUNITY)
Admission: EM | Admit: 2017-05-14 | Discharge: 2017-05-14 | Disposition: A | Discharge status: Home / Self Care | Payer: Self-pay | Attending: Emergency Medicine | Admitting: Emergency Medicine

## 2017-05-14 ENCOUNTER — Encounter (HOSPITAL_COMMUNITY): Payer: Self-pay

## 2017-05-14 ENCOUNTER — Other Ambulatory Visit: Payer: Self-pay

## 2017-05-14 DIAGNOSIS — J45909 Unspecified asthma, uncomplicated: Secondary | ICD-10-CM | POA: Insufficient documentation

## 2017-05-14 DIAGNOSIS — I10 Essential (primary) hypertension: Secondary | ICD-10-CM | POA: Insufficient documentation

## 2017-05-14 DIAGNOSIS — B9789 Other viral agents as the cause of diseases classified elsewhere: Secondary | ICD-10-CM | POA: Insufficient documentation

## 2017-05-14 DIAGNOSIS — J029 Acute pharyngitis, unspecified: Secondary | ICD-10-CM

## 2017-05-14 DIAGNOSIS — J028 Acute pharyngitis due to other specified organisms: Secondary | ICD-10-CM | POA: Insufficient documentation

## 2017-05-14 LAB — RAPID STREP SCREEN (MED CTR MEBANE ONLY): Streptococcus, Group A Screen (Direct): NEGATIVE

## 2017-05-14 MED ORDER — PREDNISONE 20 MG PO TABS: 40.0000 mg | ORAL_TABLET | Freq: Every day | ORAL | 0 refills | Status: DC

## 2017-05-14 NOTE — ED Provider Notes (Signed)
[facility] EMERGENCY DEPARTMENT Provider Note   CSN: [id number] Arrival date & time: 05/14/17  1103     History   Chief Complaint Chief Complaint  Patient presents with  . Sore Throat    HPI Roberta Guerrero is a 22 y.o. female.  HPI  22 year old female, history of allergic rhinitis but presents with approximately 48 hours of worsening sore throat, no associated fevers but has had a mild cough in the morning, she has chronic runny nose, she denies any abdominal discomfort.  She has not had any significant medications for symptoms prior to arrival.  Symptoms are persistent, nothing seems to make it better, worse with swallowing.  Past Medical History:  Diagnosis Date  . Allergic rhinitis   . Asthma   . Headache   . Hypertension   . Menstrual migraine     Patient Active Problem List   Diagnosis Date Noted  . Nexplanon insertion 07/18/2014  . Essential hypertension, benign 03/23/2014  . Food allergy 07/24/2013  . Obesity, morbid (HCC) 06/14/2013  . 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 Gastritis 02/26/2013  . Eczematous dermatitis of eyelid 09/26/2012  . Eczema 09/26/2012  . Asthma, chronic 08/09/2012  . Allergic rhinitis 08/09/2012  . Rash and nonspecific skin eruption 08/09/2012    Past Surgical History:  Procedure Laterality Date  . CHOLECYSTECTOMY    . MOUTH SURGERY    . SKIN BIOPSY      OB History    No data available       Home Medications    Prior to Admission medications   Medication Sig Start Date End Date Taking? Authorizing Provider  albuterol (PROAIR HFA) 108 (90 Base) MCG/ACT inhaler Inhale 2 puffs into the lungs every 4 (four) hours as needed for wheezing or shortness of breath. Patient not taking: Reported on 12/01/2016 09/02/15   Babs Sciara, MD  amLODipine (NORVASC) 5 MG tablet Take 1 tablet (5 mg total) by mouth daily. 12/01/16   Cheral Marker, CNM  beclomethasone (QVAR) 80 MCG/ACT inhaler Inhale 2 puffs into the lungs 2 (two) times  daily. Patient not taking: Reported on 12/01/2016 09/02/15   Babs Sciara, MD  butalbital-acetaminophen-caffeine (FIORICET/CODEINE) [phone] MG capsule Take 1 capsule by mouth every 4 (four) hours as needed for headache. Patient not taking: Reported on 12/01/2016 08/05/16   Ivery Quale, PA-C  EPINEPHrine 0.3 mg/0.3 mL IJ SOAJ injection Inject 0.3 mg into the muscle once.    [provider]  hydrochlorothiazide (MICROZIDE) 12.5 MG capsule Take 1 capsule (12.5 mg total) by mouth daily. 12/01/16   Cheral Marker, CNM  ibuprofen (ADVIL,MOTRIN) 800 MG tablet Take 1 tablet (800 mg total) by mouth 3 (three) times daily. Patient not taking: Reported on 12/01/2016 10/05/16   Burgess Amor, PA-C  ondansetron (ZOFRAN) 4 MG tablet Take 1 tablet (4 mg total) by mouth every 6 (six) hours. Patient not taking: Reported on 12/01/2016 08/05/16   Ivery Quale, PA-C  predniSONE (DELTASONE) 20 MG tablet Take 2 tablets (40 mg total) by mouth daily. 05/14/17   Eber Hong, MD    Family History Family History  Problem Relation Age of Onset  . Hypertension Mother   . Hypertension Father   . Hyperlipidemia Father   . Hypertension Maternal Grandmother   . Diabetes Maternal Grandmother   . Alcohol abuse Maternal Grandfather     Social History Social History   Tobacco Use  . Smoking status: Never Smoker  . Smokeless tobacco: Never Used  Substance Use  Topics  . Alcohol use: No  . Drug use: No     Allergies   Pineapple; Corn-containing products; and Other   Review of Systems Review of Systems  Constitutional: Negative for fever.  HENT: Positive for sore throat.   Respiratory: Negative for cough and shortness of breath.   Gastrointestinal: Negative for abdominal pain, nausea and vomiting.     Physical Exam Updated Vital Signs BP (!) 161/82 (BP Location: Right Arm)   Pulse 85   Temp 98.4 F (36.9 C) (Oral)   Resp 15   Ht 5\' 5"  (1.651 m)   Wt 129.7 kg (286 lb)   SpO2 99%   BMI  47.59 kg/m   Physical Exam  Constitutional: She appears well-developed and well-nourished.  HENT:  Head: Normocephalic and atraumatic.  Mouth/Throat: Mucous membranes are normal.  Has tonsillar swelling, and exudate - symmetrical, normal phonation  Eyes: Conjunctivae are normal. Pupils are equal, round, and reactive to light. Right eye exhibits no discharge. Left eye exhibits no discharge.  Neck: Normal range of motion. Neck supple.  Cardiovascular: Normal rate.  Pulmonary/Chest: Effort normal. No respiratory distress.  Lymphadenopathy:    She has cervical adenopathy.  Neurological: She is alert. Coordination normal.  Skin: Skin is warm and dry. No rash noted. She is not diaphoretic. No erythema.  Psychiatric: She has a normal mood and affect.  Nursing note and vitals reviewed.    ED Treatments / Results  Labs (all labs ordered are listed, but only abnormal results are displayed) Labs Reviewed  RAPID STREP SCREEN (NOT AT [facility])  CULTURE, GROUP A STREP [facility])     Radiology No results found.  Procedures Procedures (including critical care time)  Medications Ordered in ED Medications - No data to display   Initial Impression / Assessment and Plan / ED Course  I have reviewed the triage vital signs and the nursing notes.  Pertinent labs & imaging results that were available during my care of the patient were reviewed by me and considered in my medical decision making (see chart for details).     Findings consistent with possible strep or viral process, the patient has had a rapid strep ordered, otherwise she is well-appearing  Strep negative, patient well-appearing, informed this is likely viral process, no difficulty with tolerating secretions swallowing or speaking, no changes in voice, no indication for advanced imaging.  Final Clinical Impressions(s) / ED Diagnoses   Final diagnoses:  Viral pharyngitis    ED Discharge Orders        Ordered    predniSONE  (DELTASONE) 20 MG tablet  Daily     05/14/17 1140       Eber HongMiller, Tianah Lonardo, MD 05/14/17 1142

## 2017-05-14 NOTE — ED Notes (Signed)
Pt reports working at [employer]  Sore throat for the last 2 days   Throat reddened with white patches - pt with painful speaking

## 2017-05-14 NOTE — ED Triage Notes (Signed)
Pt report sore throat and cough x 2 days.  Denies fever.

## 2017-05-14 NOTE — ED Notes (Signed)
Registration in - pt has been discharged

## 2017-05-14 NOTE — Discharge Instructions (Signed)
Your symptoms are consistent with a sore throat because of an infection.  In this case it is not strep throat, your test was normal, you may have other infections such as mono or other viruses.  There is no specific treatment for these infections, your body's immune system will get rid of these over time however in the next 5 days please take the course of prednisone for your very swollen tonsils.  You may also take Tylenol as needed for fever or pain.  If your test comes back positive within 48 hours she will get a phone call.  Please see your doctor within 48 hours for recheck.  Please obtain all of your results from medical records or have your doctors office obtain the results - share them with your doctor - you should be seen at your doctors office in the next 2 days. Call today to arrange your follow up. Take the medications as prescribed. Please review all of the medicines and only take them if you do not have an allergy to them. Please be aware that if you are taking birth control pills, taking other prescriptions, ESPECIALLY ANTIBIOTICS may make the birth control ineffective - if this is the case, either do not engage in sexual activity or use alternative methods of birth control such as condoms until you have finished the medicine and your family doctor says it is OK to restart them. If you are on a blood thinner such as COUMADIN, be aware that any other medicine that you take may cause the coumadin to either work too much, or not enough - you should have your coumadin level rechecked in next 7 days if this is the case.  ?  It is also a possibility that you have an allergic reaction to any of the medicines that you have been prescribed - Everybody reacts differently to medications and while MOST people have no trouble with most medicines, you may have a reaction such as nausea, vomiting, rash, swelling, shortness of breath. If this is the case, please stop taking the medicine immediately and  contact your physician.  ?  You should return to the ER if you develop severe or worsening symptoms.

## 2017-05-17 LAB — CULTURE, GROUP A STREP (THRC)

## 2017-05-31 DIAGNOSIS — Z79899 Other long term (current) drug therapy: Secondary | ICD-10-CM | POA: Insufficient documentation

## 2017-05-31 DIAGNOSIS — I10 Essential (primary) hypertension: Secondary | ICD-10-CM | POA: Insufficient documentation

## 2017-05-31 DIAGNOSIS — J209 Acute bronchitis, unspecified: Secondary | ICD-10-CM | POA: Insufficient documentation

## 2017-06-01 ENCOUNTER — Emergency Department (HOSPITAL_COMMUNITY): Payer: Self-pay

## 2017-06-01 ENCOUNTER — Other Ambulatory Visit: Payer: Self-pay

## 2017-06-01 ENCOUNTER — Encounter (HOSPITAL_COMMUNITY): Payer: Self-pay | Admitting: *Deleted

## 2017-06-01 ENCOUNTER — Emergency Department (HOSPITAL_COMMUNITY)
Admission: EM | Admit: 2017-06-01 | Discharge: 2017-06-01 | Disposition: A | Discharge status: Home / Self Care | Payer: Self-pay | Attending: Emergency Medicine | Admitting: Emergency Medicine

## 2017-06-01 DIAGNOSIS — J209 Acute bronchitis, unspecified: Secondary | ICD-10-CM

## 2017-06-01 MED ORDER — BENZONATATE 100 MG PO CAPS: 100.0000 mg | ORAL_CAPSULE | Freq: Three times a day (TID) | ORAL | 0 refills | Status: DC

## 2017-06-01 MED ORDER — PREDNISONE 20 MG PO TABS: 40.0000 mg | ORAL_TABLET | Freq: Every day | ORAL | 0 refills | Status: DC

## 2017-06-01 MED ORDER — PREDNISONE 50 MG PO TABS
60.0000 mg | ORAL_TABLET | Freq: Once | ORAL | Status: AC
  Administered 2017-06-01: 60 mg via ORAL
  Filled 2017-06-01: qty 1

## 2017-06-01 MED ORDER — ALBUTEROL SULFATE HFA 108 (90 BASE) MCG/ACT IN AERS
2.0000 | INHALATION_SPRAY | RESPIRATORY_TRACT | Status: DC | PRN
  Administered 2017-06-01: 2 via RESPIRATORY_TRACT
  Filled 2017-06-01 (×2): qty 6.7

## 2017-06-01 NOTE — ED Provider Notes (Signed)
[facility] EMERGENCY DEPARTMENT Provider Note   CSN: [id number] Arrival date & time: 05/31/17  2312     History   Chief Complaint Chief Complaint  Patient presents with  . Generalized Body Aches    HPI Roberta Guerrero is a 22 y.o. female.  Patient presents to the emergency department for evaluation of cough and chest congestion.  Symptoms present for 1 week.  Patient reports of burning pain in her chest when she coughs.  She has not documented any fever.  Cough is nonproductive.      Past Medical History:  Diagnosis Date  . Allergic rhinitis   . Asthma   . Headache   . Hypertension   . Menstrual migraine     Patient Active Problem List   Diagnosis Date Noted  . Nexplanon insertion 07/18/2014  . Essential hypertension, benign 03/23/2014  . Food allergy 07/24/2013  . Obesity, morbid (HCC) 06/14/2013  . 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 Gastritis 02/26/2013  . Eczematous dermatitis of eyelid 09/26/2012  . Eczema 09/26/2012  . Asthma, chronic 08/09/2012  . Allergic rhinitis 08/09/2012  . Rash and nonspecific skin eruption 08/09/2012    Past Surgical History:  Procedure Laterality Date  . CHOLECYSTECTOMY    . MOUTH SURGERY    . SKIN BIOPSY      OB History    No data available       Home Medications    Prior to Admission medications   Medication Sig Start Date End Date Taking? Authorizing Provider  albuterol (PROAIR HFA) 108 (90 Base) MCG/ACT inhaler Inhale 2 puffs into the lungs every 4 (four) hours as needed for wheezing or shortness of breath. Patient not taking: Reported on 12/01/2016 09/02/15   Babs Sciara, MD  amLODipine (NORVASC) 5 MG tablet Take 1 tablet (5 mg total) by mouth daily. 12/01/16   Cheral Marker, CNM  beclomethasone (QVAR) 80 MCG/ACT inhaler Inhale 2 puffs into the lungs 2 (two) times daily. Patient not taking: Reported on 12/01/2016 09/02/15   Babs Sciara, MD  benzonatate (TESSALON) 100 MG capsule Take 1 capsule (100 mg  total) by mouth every 8 (eight) hours. 06/01/17   Gilda Crease, MD  butalbital-acetaminophen-caffeine (FIORICET/CODEINE) [phone] MG capsule Take 1 capsule by mouth every 4 (four) hours as needed for headache. Patient not taking: Reported on 12/01/2016 08/05/16   Ivery Quale, PA-C  EPINEPHrine 0.3 mg/0.3 mL IJ SOAJ injection Inject 0.3 mg into the muscle once.    [provider]  hydrochlorothiazide (MICROZIDE) 12.5 MG capsule Take 1 capsule (12.5 mg total) by mouth daily. 12/01/16   Cheral Marker, CNM  ibuprofen (ADVIL,MOTRIN) 800 MG tablet Take 1 tablet (800 mg total) by mouth 3 (three) times daily. Patient not taking: Reported on 12/01/2016 10/05/16   Burgess Amor, PA-C  ondansetron (ZOFRAN) 4 MG tablet Take 1 tablet (4 mg total) by mouth every 6 (six) hours. Patient not taking: Reported on 12/01/2016 08/05/16   Ivery Quale, PA-C  predniSONE (DELTASONE) 20 MG tablet Take 2 tablets (40 mg total) by mouth daily with breakfast. 06/01/17   Gilda Crease, MD    Family History Family History  Problem Relation Age of Onset  . Hypertension Mother   . Hypertension Father   . Hyperlipidemia Father   . Hypertension Maternal Grandmother   . Diabetes Maternal Grandmother   . Alcohol abuse Maternal Grandfather     Social History Social History   Tobacco Use  . Smoking status: Never Smoker  .  Smokeless tobacco: Never Used  Substance Use Topics  . Alcohol use: No  . Drug use: No     Allergies   Pineapple; Corn-containing products; and Other   Review of Systems Review of Systems  Respiratory: Positive for cough.   All other systems reviewed and are negative.    Physical Exam Updated Vital Signs BP (!) 159/102 (BP Location: Right Arm)   Pulse 81   Temp 98.5 F (36.9 C) (Oral)   Resp 16   Ht 5\' 5"  (1.651 m)   Wt 129.7 kg (286 lb)   LMP 11/29/2016 (Approximate) Comment: states LMP "about 6 months ago".  SpO2 100%   BMI 47.59 kg/m   Physical  Exam  Constitutional: She is oriented to person, place, and time. She appears well-developed and well-nourished. No distress.  HENT:  Head: Normocephalic and atraumatic.  Right Ear: Hearing normal.  Left Ear: Hearing normal.  Nose: Nose normal.  Mouth/Throat: Oropharynx is clear and moist and mucous membranes are normal.  Eyes: Conjunctivae and EOM are normal. Pupils are equal, round, and reactive to light.  Neck: Normal range of motion. Neck supple.  Cardiovascular: Regular rhythm, S1 normal and S2 normal. Exam reveals no gallop and no friction rub.  No murmur heard. Pulmonary/Chest: Effort normal and breath sounds normal. No respiratory distress. She exhibits no tenderness.  Abdominal: Soft. Normal appearance and bowel sounds are normal. There is no hepatosplenomegaly. There is no tenderness. There is no rebound, no guarding, no tenderness at McBurney's point and negative Murphy's sign. No hernia.  Musculoskeletal: Normal range of motion.  Neurological: She is alert and oriented to person, place, and time. She has normal strength. No cranial nerve deficit or sensory deficit. Coordination normal. GCS eye subscore is 4. GCS verbal subscore is 5. GCS motor subscore is 6.  Skin: Skin is warm, dry and intact. No rash noted. No cyanosis.  Psychiatric: She has a normal mood and affect. Her speech is normal and behavior is normal. Thought content normal.  Nursing note and vitals reviewed.    ED Treatments / Results  Labs (all labs ordered are listed, but only abnormal results are displayed) Labs Reviewed - No data to display  EKG  EKG Interpretation None       Radiology Dg Chest 2 View  Result Date: 06/01/2017 CLINICAL DATA:  Cough and chest pain for 1 week.  Body aches. EXAM: CHEST  2 VIEW COMPARISON:  01/12/2016 FINDINGS: The cardiomediastinal contours are normal. Moderate bronchial thickening. Pulmonary vasculature is normal. No consolidation, pleural effusion, or pneumothorax. No  acute osseous abnormalities are seen. IMPRESSION: Moderate bronchial thickening suggesting bronchitis or asthma. No focal airspace disease. Electronically Signed   By: Rubye Oaks M.D.   On: 06/01/2017 01:07    Procedures Procedures (including critical care time)  Medications Ordered in ED Medications  albuterol (PROVENTIL HFA;VENTOLIN HFA) 108 (90 Base) MCG/ACT inhaler 2 puff (not administered)  predniSONE (DELTASONE) tablet 60 mg (not administered)     Initial Impression / Assessment and Plan / ED Course  I have reviewed the triage vital signs and the nursing notes.  Pertinent labs & imaging results that were available during my care of the patient were reviewed by me and considered in my medical decision making (see chart for details).     She appears well.  Oxygenation is 100% on room air.  All vital signs are normal except for mild hypertension.  She reports a history of asthma, does not currently have an inhaler.  She has not currently experiencing any bronchospasm.  Will treat with prednisone and albuterol, Tessalon for cough.  Final Clinical Impressions(s) / ED Diagnoses   Final diagnoses:  Acute bronchitis, unspecified organism    ED Discharge Orders        Ordered    predniSONE (DELTASONE) 20 MG tablet  Daily with breakfast     06/01/17 0115    benzonatate (TESSALON) 100 MG capsule  Every 8 hours     06/01/17 0115       Gilda CreasePollina, Christopher J, MD 06/01/17 [phone]

## 2017-06-01 NOTE — ED Triage Notes (Signed)
Pt c/o generalized body aches with non-productive cough and chest pain x 1 week

## 2017-08-24 ENCOUNTER — Ambulatory Visit: Payer: Self-pay | Admitting: Adult Health

## 2017-08-31 ENCOUNTER — Encounter: Payer: Self-pay | Admitting: Adult Health

## 2017-08-31 ENCOUNTER — Ambulatory Visit (INDEPENDENT_AMBULATORY_CARE_PROVIDER_SITE_OTHER): Payer: Self-pay | Admitting: Adult Health

## 2017-08-31 VITALS — BP 140/90 | HR 98 | Ht 65.0 in | Wt 292.4 lb

## 2017-08-31 DIAGNOSIS — R630 Anorexia: Secondary | ICD-10-CM

## 2017-08-31 DIAGNOSIS — Z975 Presence of (intrauterine) contraceptive device: Secondary | ICD-10-CM

## 2017-08-31 DIAGNOSIS — R110 Nausea: Secondary | ICD-10-CM

## 2017-08-31 DIAGNOSIS — Z3202 Encounter for pregnancy test, result negative: Secondary | ICD-10-CM

## 2017-08-31 LAB — POCT URINE PREGNANCY: PREG TEST UR: NEGATIVE

## 2017-08-31 NOTE — Progress Notes (Signed)
  Subjective:     Patient ID: Roberta Guerrero, female   DOB: [birth year], 22 y.o.   MRN: [id number]  HPI Roberta Guerrero is a a 22 year old black female in with her mom, complaining of feeling pregnant, has nexplanon and has had negative HPTs.Her boyfriend feels sick too.  Review of Systems +nasuea +loss of appetite More emotional Reviewed past medical,surgical, social and family history. Reviewed medications and allergies.     Objective:   Physical Exam BP 140/90 (BP Location: Left Arm, Patient Position: Sitting, Cuff Size: Large)   Pulse 98   Ht  (1.651 m)   Wt 292 lb 6.4 oz (132.6 kg)   BMI 48.66 kg/m  UPT negative. Skin warm and dry. Neck: mid line trachea, normal thyroid, good ROM, no lymphadenopathy noted. Lungs: clear to ausculation bilaterally. Cardiovascular: regular rate and rhythm.Abodmen is soft, some mild tenderness on left, nexplanon in left arm,she declines meds for nausea and labs at this time. Discussed taking nexplanon out and she wants it .     Assessment:     1. Nausea   2. Decreased appetite   3. Pregnancy examination or test, negative result   4. Nexplanon in place       Plan:     Return in 2 days for nexplanon removal

## 2017-09-02 ENCOUNTER — Encounter: Payer: Medicaid Other | Admitting: Adult Health

## 2017-09-03 ENCOUNTER — Other Ambulatory Visit (HOSPITAL_COMMUNITY)
Admission: RE | Admit: 2017-09-03 | Discharge: 2017-09-03 | Disposition: A | Discharge status: Home / Self Care | Payer: Medicaid Other | Source: Ambulatory Visit | Attending: Adult Health | Admitting: Adult Health

## 2017-09-03 ENCOUNTER — Encounter: Payer: Self-pay | Admitting: Adult Health

## 2017-09-03 ENCOUNTER — Ambulatory Visit (INDEPENDENT_AMBULATORY_CARE_PROVIDER_SITE_OTHER): Payer: Medicaid Other | Admitting: Adult Health

## 2017-09-03 VITALS — BP 130/100 | HR 98 | Ht 65.0 in | Wt 290.0 lb

## 2017-09-03 DIAGNOSIS — Z309 Encounter for contraceptive management, unspecified: Secondary | ICD-10-CM

## 2017-09-03 DIAGNOSIS — Z975 Presence of (intrauterine) contraceptive device: Secondary | ICD-10-CM | POA: Insufficient documentation

## 2017-09-03 DIAGNOSIS — Z113 Encounter for screening for infections with a predominantly sexual mode of transmission: Secondary | ICD-10-CM | POA: Insufficient documentation

## 2017-09-03 DIAGNOSIS — Z01419 Encounter for gynecological examination (general) (routine) without abnormal findings: Secondary | ICD-10-CM | POA: Insufficient documentation

## 2017-09-03 DIAGNOSIS — I10 Essential (primary) hypertension: Secondary | ICD-10-CM

## 2017-09-03 DIAGNOSIS — Z3009 Encounter for other general counseling and advice on contraception: Secondary | ICD-10-CM | POA: Insufficient documentation

## 2017-09-03 MED ORDER — HYDROCHLOROTHIAZIDE 12.5 MG PO CAPS: 12.5000 mg | ORAL_CAPSULE | Freq: Every day | ORAL | 3 refills | Status: DC

## 2017-09-03 NOTE — Progress Notes (Signed)
Patient ID: Roberta Guerrero, female   DOB: May 12, 1995, 22 y.o.   MRN: [id number] History of Present Illness:  Roberta Guerrero is a 22 year old black female in for well woman gyn exam and pap, she has family planning medicaid and wants nexplanon out. PCP is Dr Gerda Diss.  Current Medications, Allergies, Past Medical History, Past Surgical History, Family History and Social History were reviewed in [employer] record.     Review of Systems: Patient denies any headaches, hearing loss,  blurred vision, shortness of breath, chest pain, abdominal pain, problems with bowel movements, urination, or intercourse. No joint pain or mood swings. +tired, +weight gain, +nausea, decrease in appetite.    Physical Exam:BP (!) 130/100 (BP Location: Left Arm, Patient Position: Sitting, Cuff Size: Large)   Pulse 98   Ht  (1.651 m)   Wt 290 lb (131.5 kg)   BMI 48.26 kg/m  General:  Well developed, well nourished, no acute distress Skin:  Warm and dry Neck:  Midline trachea, normal thyroid, good ROM, no lymphadenopathy Lungs; Clear to auscultation bilaterally Breast:  No dominant palpable mass, retraction, or nipple discharge Cardiovascular: Regular rate and rhythm Abdomen:  Soft, non tender, no hepatosplenomegaly Pelvic:  External genitalia is normal in appearance, no lesions.  The vagina is normal in appearance. Urethra has no lesions or masses. The cervix is smooth, pap with GC/CHL performed.  Uterus is felt to be normal size, shape, and contour.  No adnexal masses or tenderness noted.Bladder is non tender, no masses felt. Extremities/musculoskeletal:  No swelling or varicosities noted, no clubbing or cyanosis Psych:  No mood changes, alert and cooperative,seems happy Will start on microzide for BP, avoid sugar and salt.    Impression: 1. Encounter for gynecological examination with Papanicolaou smear of cervix   2. Family planning   3. Screen for STD (sexually transmitted disease)    4. Nexplanon in place   5. Chronic hypertension       Plan: Meds ordered this encounter  Medications  . hydrochlorothiazide (MICROZIDE) 12.5 MG capsule    Sig: Take 1 capsule (12.5 mg total) by mouth daily.    Dispense:  30 capsule    Refill:  3    Order Specific Question:   Supervising Provider    Answer:   Duane Lope H [[id number]]   Check HIV and RPR Return 5/28 for nexplanon removal

## 2017-09-03 NOTE — Addendum Note (Signed)
Addended by: Federico Flake A on: 09/03/2017 01:27 PM   Modules accepted: Orders

## 2017-09-07 ENCOUNTER — Ambulatory Visit (INDEPENDENT_AMBULATORY_CARE_PROVIDER_SITE_OTHER): Payer: Medicaid Other | Admitting: Adult Health

## 2017-09-07 ENCOUNTER — Encounter: Payer: Self-pay | Admitting: Adult Health

## 2017-09-07 VITALS — BP 136/88 | HR 92 | Ht 65.0 in | Wt 288.0 lb

## 2017-09-07 DIAGNOSIS — Z3049 Encounter for surveillance of other contraceptives: Secondary | ICD-10-CM

## 2017-09-07 DIAGNOSIS — Z3046 Encounter for surveillance of implantable subdermal contraceptive: Secondary | ICD-10-CM

## 2017-09-07 NOTE — Progress Notes (Signed)
  Subjective:     Patient ID: Roberta Guerrero, female   DOB: [birth year], 22 y.o.   MRN: [id number]  HPI Roberta Guerrero is a 22 year old black female in for nexplanon removal.   Review of Systems For nexplanon removal Reviewed past medical,surgical, social and family history. Reviewed medications and allergies.     Objective:   Physical Exam BP 136/88 (BP Location: Left Arm, Patient Position: Sitting, Cuff Size: Large)   Pulse 92   Ht  (1.651 m)   Wt 288 lb (130.6 kg)   BMI 47.93 kg/m  Consent sighed, time out called. Left arm cleansed with betadine, and injected with 1.5 cc 1% lidocaine and waited til numb.Under sterile technique a #11 blade was used to make small vertical incision, and a curved forceps was used to easily remove rod. Steri strips applied. Pressure dressing applied.    Assessment:     1. Encounter for Nexplanon removal       Plan:     Use condoms, keep clean and dry x 24 hours, no heavy lifting, keep steri strips on x 72 hours, Keep pressure dressing on x 24 hours. Follow up prn problems.

## 2017-09-07 NOTE — Patient Instructions (Signed)
Use condoms, keep clean and dry x 24 hours, no heavy lifting, keep steri strips on x 72 hours, Keep pressure dressing on x 24 hours. Follow up prn problems.  

## 2017-09-08 LAB — CYTOLOGY - PAP
Adequacy: ABSENT
Chlamydia: POSITIVE — AB
Diagnosis: NEGATIVE
Neisseria Gonorrhea: NEGATIVE

## 2017-09-08 LAB — HIV ANTIBODY (ROUTINE TESTING W REFLEX): HIV SCREEN 4TH GENERATION: NONREACTIVE

## 2017-09-08 LAB — RPR: RPR Ser Ql: NONREACTIVE

## 2017-09-09 ENCOUNTER — Other Ambulatory Visit: Payer: Self-pay | Admitting: Women's Health

## 2017-09-09 ENCOUNTER — Encounter: Payer: Self-pay | Admitting: *Deleted

## 2017-09-09 ENCOUNTER — Encounter: Payer: Self-pay | Admitting: Women's Health

## 2017-09-09 ENCOUNTER — Telehealth: Payer: Self-pay | Admitting: *Deleted

## 2017-09-09 DIAGNOSIS — A749 Chlamydial infection, unspecified: Secondary | ICD-10-CM | POA: Insufficient documentation

## 2017-09-09 MED ORDER — AZITHROMYCIN 500 MG PO TABS: 1000.0000 mg | ORAL_TABLET | Freq: Once | ORAL | 0 refills | Status: AC

## 2017-09-10 NOTE — Telephone Encounter (Signed)
Spoke to patient who states partner went to [facility] for treatment.

## 2017-09-14 ENCOUNTER — Encounter: Payer: Self-pay | Admitting: *Deleted

## 2017-10-13 ENCOUNTER — Ambulatory Visit: Payer: Medicaid Other | Admitting: Adult Health

## 2017-11-20 ENCOUNTER — Other Ambulatory Visit: Payer: Self-pay

## 2017-11-20 ENCOUNTER — Emergency Department (HOSPITAL_COMMUNITY)
Admission: EM | Admit: 2017-11-20 | Discharge: 2017-11-20 | Disposition: A | Discharge status: Home / Self Care | Payer: Medicaid Other | Attending: Emergency Medicine | Admitting: Emergency Medicine

## 2017-11-20 ENCOUNTER — Encounter (HOSPITAL_COMMUNITY): Payer: Self-pay | Admitting: Emergency Medicine

## 2017-11-20 DIAGNOSIS — R102 Pelvic and perineal pain: Secondary | ICD-10-CM | POA: Insufficient documentation

## 2017-11-20 DIAGNOSIS — R1032 Left lower quadrant pain: Secondary | ICD-10-CM | POA: Insufficient documentation

## 2017-11-20 DIAGNOSIS — Z79899 Other long term (current) drug therapy: Secondary | ICD-10-CM | POA: Insufficient documentation

## 2017-11-20 DIAGNOSIS — N898 Other specified noninflammatory disorders of vagina: Secondary | ICD-10-CM | POA: Insufficient documentation

## 2017-11-20 DIAGNOSIS — I10 Essential (primary) hypertension: Secondary | ICD-10-CM | POA: Insufficient documentation

## 2017-11-20 DIAGNOSIS — J45909 Unspecified asthma, uncomplicated: Secondary | ICD-10-CM | POA: Insufficient documentation

## 2017-11-20 DIAGNOSIS — R112 Nausea with vomiting, unspecified: Secondary | ICD-10-CM | POA: Insufficient documentation

## 2017-11-20 LAB — URINALYSIS, ROUTINE W REFLEX MICROSCOPIC
Bacteria, UA: NONE SEEN
Bilirubin Urine: NEGATIVE
Glucose, UA: NEGATIVE mg/dL
Hgb urine dipstick: NEGATIVE
KETONES UR: NEGATIVE mg/dL
Nitrite: NEGATIVE
PROTEIN: NEGATIVE mg/dL
Specific Gravity, Urine: 1.023 (ref 1.005–1.030)
pH: 5 (ref 5.0–8.0)

## 2017-11-20 LAB — COMPREHENSIVE METABOLIC PANEL
ALT: 16 U/L (ref 0–44)
AST: 17 U/L (ref 15–41)
Albumin: 3.8 g/dL (ref 3.5–5.0)
Alkaline Phosphatase: 62 U/L (ref 38–126)
Anion gap: 7 (ref 5–15)
BUN: 6 mg/dL (ref 6–20)
CO2: 25 mmol/L (ref 22–32)
Calcium: 8.7 mg/dL — ABNORMAL LOW (ref 8.9–10.3)
Chloride: 107 mmol/L (ref 98–111)
Creatinine, Ser: 0.88 mg/dL (ref 0.44–1.00)
GFR calc Af Amer: >60 mL/min (ref >60)
GFR calc non Af Amer: >60 mL/min (ref >60)
Glucose, Bld: 97 mg/dL (ref 70–99)
Potassium: 3.4 mmol/L — ABNORMAL LOW (ref 3.5–5.1)
Sodium: 139 mmol/L (ref 135–145)
Total Bilirubin: 0.9 mg/dL (ref 0.3–1.2)
Total Protein: 7.5 g/dL (ref 6.5–8.1)

## 2017-11-20 LAB — CBC WITH DIFFERENTIAL/PLATELET
Basophils Absolute: 0 10*3/uL (ref 0.0–0.1)
Basophils Relative: 1 %
EOS ABS: 0.3 10*3/uL (ref 0.0–0.7)
Eosinophils Relative: 5 %
HCT: 41.8 % (ref 36.0–46.0)
HEMOGLOBIN: 13.4 g/dL (ref 12.0–15.0)
LYMPHS ABS: 2.2 10*3/uL (ref 0.7–4.0)
LYMPHS PCT: 37 %
MCH: 26.4 pg (ref 26.0–34.0)
MCHC: 32.1 g/dL (ref 30.0–36.0)
MCV: 82.4 fL (ref 78.0–100.0)
Monocytes Absolute: 0.6 10*3/uL (ref 0.1–1.0)
Monocytes Relative: 10 %
NEUTROS ABS: 2.8 10*3/uL (ref 1.7–7.7)
NEUTROS PCT: 47 %
Platelets: 280 10*3/uL (ref 150–400)
RBC: 5.07 MIL/uL (ref 3.87–5.11)
RDW: 13.8 % (ref 11.5–15.5)
WBC: 5.8 10*3/uL (ref 4.0–10.5)

## 2017-11-20 LAB — WET PREP, GENITAL
Clue Cells Wet Prep HPF POC: NONE SEEN
Sperm: NONE SEEN
Trich, Wet Prep: NONE SEEN
Yeast Wet Prep HPF POC: NONE SEEN

## 2017-11-20 LAB — LIPASE, BLOOD: Lipase: 25 U/L (ref 11–51)

## 2017-11-20 LAB — I-STAT BETA HCG BLOOD, ED (MC, WL, AP ONLY): I-stat hCG, quantitative: <5 m[IU]/mL (ref <5)

## 2017-11-20 MED ORDER — CEFTRIAXONE SODIUM 250 MG IJ SOLR
250.0000 mg | Freq: Once | INTRAMUSCULAR | Status: AC
  Administered 2017-11-20: 250 mg via INTRAMUSCULAR
  Filled 2017-11-20: qty 250

## 2017-11-20 MED ORDER — AZITHROMYCIN 250 MG PO TABS
500.0000 mg | ORAL_TABLET | Freq: Once | ORAL | Status: AC
  Administered 2017-11-20: 500 mg via ORAL
  Filled 2017-11-20: qty 2

## 2017-11-20 MED ORDER — STERILE WATER FOR INJECTION IJ SOLN
INTRAMUSCULAR | Status: AC
  Filled 2017-11-20: qty 10

## 2017-11-20 NOTE — ED Provider Notes (Signed)
[facility] EMERGENCY DEPARTMENT Provider Note   CSN: [id number] Arrival date & time: 11/20/17  1052  History   Chief Complaint Chief Complaint  Patient presents with  . Abdominal Pain    HPI Roberta Guerrero is a 22 y.o. female with a history of recurrent Chlamydia and hypertension presents to the ED with a 1 month history of lower abdominal pain. Pain is rated a 6/10 and radiates to the left lower quadrant. Denies aggravating and alleviating factors.  She was treated for Chlamydia 1 month ago. Does not know if her boyfriend was treated prior to resuming intercourse. Admits to vaginal discharge, denies odor to discharge, vaginal pruritis, 2 episodes of emesis and nausea. Denies fever, chills, hematemesis, hematochezia, chest pain, SOB. Admits to intermittent episodes of diarrhea since her cholecystectomy in 2012 when eating greasy food.   Past Medical History:  Diagnosis Date  . Allergic rhinitis   . Asthma   . Headache   . Hypertension   . Menstrual migraine     Patient Active Problem List   Diagnosis Date Noted  . Chlamydia 09/09/2017  . Chronic hypertension 09/03/2017  . Nexplanon in place 09/03/2017  . Screen for STD (sexually transmitted disease) 09/03/2017  . Family planning 09/03/2017  . Encounter for gynecological examination with Papanicolaou smear of cervix 09/03/2017  . Nexplanon insertion 07/18/2014  . Essential hypertension, benign 03/23/2014  . Food allergy 07/24/2013  . Obesity, morbid (HCC) 06/14/2013  . 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 Gastritis 02/26/2013  . Eczematous dermatitis of eyelid 09/26/2012  . Eczema 09/26/2012  . Asthma, chronic 08/09/2012  . Allergic rhinitis 08/09/2012  . Rash and nonspecific skin eruption 08/09/2012    Past Surgical History:  Procedure Laterality Date  . CHOLECYSTECTOMY    . MOUTH SURGERY    . SKIN BIOPSY       OB History    Gravida  0   Para  0   Term  0   Preterm  0   AB  0   Living  0     SAB  0    TAB  0   Ectopic  0   Multiple  0   Live Births  0            Home Medications    Prior to Admission medications   Medication Sig Start Date End Date Taking? Authorizing Provider  EPINEPHrine 0.3 mg/0.3 mL IJ SOAJ injection Inject 0.3 mg into the muscle once.   Yes [provider]  hydrochlorothiazide (MICROZIDE) 12.5 MG capsule Take 1 capsule (12.5 mg total) by mouth daily. 09/03/17  Yes Adline Potter, NP    Family History Family History  Problem Relation Age of Onset  . Hypertension Mother   . Hypertension Father   . Hyperlipidemia Father   . Hypertension Maternal Grandmother   . Diabetes Maternal Grandmother   . Alcohol abuse Maternal Grandfather     Social History Social History   Tobacco Use  . Smoking status: Never Smoker  . Smokeless tobacco: Never Used  Substance Use Topics  . Alcohol use: Yes    Comment: occasional  . Drug use: No     Allergies   Pineapple; Corn-containing products; and Other   Review of Systems Review of Systems  Constitutional: Negative for appetite change, chills and fever.  Respiratory: Negative for chest tightness and shortness of breath.   Cardiovascular: Negative for chest pain and leg swelling.  Gastrointestinal: Positive for abdominal pain, nausea and vomiting.  Negative for abdominal distention, blood in stool and constipation.  Genitourinary: Positive for pelvic pain and vaginal discharge. Negative for difficulty urinating, dysuria, flank pain and hematuria.     Physical Exam Updated Vital Signs BP (!) 141/95 (BP Location: Right Arm)   Pulse (!) 53   Temp 98.9 F (37.2 C) (Oral)   Resp 16   Ht 5\' 5"  (1.651 m)   Wt 122.5 kg   LMP 11/09/2017   SpO2 100%   BMI 44.93 kg/m   Physical Exam  Constitutional: She appears well-developed and well-nourished.  Non-toxic appearance. She does not appear ill. No distress.  HENT:  Head: Atraumatic.  Eyes: Pupils are equal, round, and reactive to light.    Neck: Normal range of motion. Neck supple.  Cardiovascular: Normal rate and regular rhythm.  Pulmonary/Chest: Effort normal and breath sounds normal. No respiratory distress.  Abdominal: Soft. Bowel sounds are normal. She exhibits no shifting dullness, no distension, no fluid wave and no mass. There is no hepatosplenomegaly. There is tenderness in the suprapubic area and left lower quadrant. There is no rigidity, no rebound, no guarding, no CVA tenderness, no tenderness at McBurney's point and negative Murphy's sign. No hernia.  Genitourinary: Uterus is not enlarged. Cervix exhibits discharge. Right adnexum displays no mass, no tenderness and no fullness. Left adnexum displays no mass and no fullness. Vaginal discharge found.  Genitourinary Comments: Mild left adnexa tenderness. No cervical motion tenderness. No cervical petechiae.   Musculoskeletal: Normal range of motion.  Neurological: She is alert.  Skin: Skin is warm and dry. She is not diaphoretic.  Psychiatric: She has a normal mood and affect.  Nursing note and vitals reviewed.    ED Treatments / Results  Labs (all labs ordered are listed, but only abnormal results are displayed) Labs Reviewed  WET PREP, GENITAL - Abnormal; Notable for the following components:      Result Value   WBC, Wet Prep HPF POC FEW (*)    All other components within normal limits  COMPREHENSIVE METABOLIC PANEL - Abnormal; Notable for the following components:   Potassium 3.4 (*)    Calcium 8.7 (*)    All other components within normal limits  URINALYSIS, ROUTINE W REFLEX MICROSCOPIC - Abnormal; Notable for the following components:   Leukocytes, UA SMALL (*)    All other components within normal limits  LIPASE, BLOOD  CBC WITH DIFFERENTIAL/PLATELET  I-STAT BETA HCG BLOOD, ED ([facility], [facility], [facility] ONLY)  GC/CHLAMYDIA PROBE AMP ([facility]) NOT AT [facility]    EKG None  Radiology No results found.  Procedures Procedures (including critical care  time)  Medications Ordered in ED Medications  sterile water (preservative free) injection (has no administration in time range)  azithromycin (ZITHROMAX) tablet 500 mg (500 mg Oral Given 11/20/17 1342)  cefTRIAXone (ROCEPHIN) injection 250 mg (250 mg Intramuscular Given 11/20/17 1343)     Initial Impression / Assessment and Plan / ED Course  I have reviewed the triage vital signs and the nursing notes and the patients past medical history.  Pertinent labs & imaging results that were available during my care of the patient were reviewed by me and considered in my medical decision making (see chart for details).  22- year old with a history of recurrent chlamydia infections,  hypertension and obesity presents with lower abdominal pain. Denies fever, chills, CVA tenderness, urinary symptoms, chest pain or SOB. Patient is nontoxic, nonseptic appearing, in no apparent distress.  Labs, imaging and vitals reviewed. No evidence  of leukocytosis. Negative urine. Abdomen is soft. Pelvic exam performed with female chaperone in the room. Yellow discharge noted at the cervix. No cervical petechiae.  No cervical motion tenderness. Patient does not meet the SIRS or Sepsis criteria.  On repeat exam patient does not have a surgical abdomin and there are no peritoneal signs. I do not feel she needs further imaging at this time. No indication of appendicitis, bowel obstruction, bowel perforation, cholecystitis, diverticulitis, PID, or ectopic pregnancy. Will treat prophylacticly with Rocephin and Azithromycin for her history of Chlamydia given her physical exam and history. Discussed with patient and her mother treatment, plan and to obtain from sexual intercourse until she receives the results of her GC/Chlamydia test. Return precautions discussed and patient agrees to follow up with her primary care provider.      Final Clinical Impressions(s) / ED Diagnoses   Final diagnoses:  Pelvic pain in female    ED  Discharge Orders    None       Michale Emmerich A, PA-C 11/20/17 1506    Eber Hong, MD 11/21/17 1512

## 2017-11-20 NOTE — ED Provider Notes (Signed)
Medical screening examination/treatment/procedure(s) were conducted as a shared visit with non-physician practitioner(s) and myself.  I personally evaluated the patient during the encounter.  Clinical Impression:   Final diagnoses:  Pelvic pain in female    The patient is a 22 year old female who presents with lower abdominal discomfort, she has had recent treatment for chlamydia, states that her boyfriend was treated as well, she did give him oral sex before the treatment was complete, she has been using a condom for sex otherwise.  She denies frank discharge.  On my exam she has a soft nontender abdomen, she is obese, her heart sounds are normal, she is in no distress.  Labs reviewed, gonorrhea and chlamydia samples recent after pelvic exam, see physician assistant note.  Patient informed that she will receive a phone call if tests are positive, informed to abstain from sexual activity until that time.  Patient agreeable.  She does not have a surgical abdomen and I do not think she needs any further imaging.   Eber HongMiller, Ziomara Birenbaum, MD 11/21/17 [phone]

## 2017-11-20 NOTE — ED Triage Notes (Signed)
Patient c/o mid to left lower abd pain. Per patient intermittent pain "for a while" but pain became constant 3 days ago. Patient reports loss of appetite, diarrhea, nausea, and vomiting. Denies any fevers. Per patient some urinary symptoms and extreme thirst.

## 2017-11-20 NOTE — Discharge Instructions (Addendum)
Your pain is most likely the result of your previous infection and you have received antibiotics while here in the Ed. Please follow up with your primary care provider. You may take tylenol or ibuprofen for pain as prescribed.  Get help right away if: You have sudden pain that is very bad. Your pain continues to get worse. You have very bad pain and also have any of the following symptoms: A fever. Feeling stick to your stomach (nausea). Throwing up (vomiting). Being very sweaty. You pass out (lose consciousness).

## 2017-11-21 ENCOUNTER — Inpatient Hospital Stay (HOSPITAL_COMMUNITY)
Admission: AD | Admit: 2017-11-21 | Discharge: 2017-11-21 | Disposition: A | Discharge status: Home / Self Care | Payer: Medicaid Other | Source: Ambulatory Visit | Attending: Obstetrics and Gynecology | Admitting: Obstetrics and Gynecology

## 2017-11-21 ENCOUNTER — Other Ambulatory Visit: Payer: Self-pay

## 2017-11-21 DIAGNOSIS — Z5321 Procedure and treatment not carried out due to patient leaving prior to being seen by health care provider: Secondary | ICD-10-CM | POA: Insufficient documentation

## 2017-11-21 LAB — URINALYSIS, ROUTINE W REFLEX MICROSCOPIC
Bilirubin Urine: NEGATIVE
GLUCOSE, UA: NEGATIVE mg/dL
HGB URINE DIPSTICK: NEGATIVE
Ketones, ur: NEGATIVE mg/dL
NITRITE: NEGATIVE
PH: 6 (ref 5.0–8.0)
Protein, ur: NEGATIVE mg/dL
SPECIFIC GRAVITY, URINE: 1.02 (ref 1.005–1.030)

## 2017-11-21 LAB — POCT PREGNANCY, URINE: PREG TEST UR: NEGATIVE

## 2017-11-21 NOTE — MAU Note (Signed)
Pt presents with c/o abdominal pain on left side ttah began 1 month ago.  [facility], given "shot" and antibiotics.

## 2017-11-21 NOTE — Progress Notes (Signed)
Pt called to be put into room, not currently in lobby.

## 2017-11-21 NOTE — Progress Notes (Signed)
Pt called a 3rd time to be placed into a room, not in lobby.  Pt left AMA prior to being seen by [facility].

## 2017-11-21 NOTE — Progress Notes (Signed)
Pt called again to be placed in room, not sitting in lobby.

## 2017-11-21 NOTE — MAU Note (Signed)
Urine in lab 

## 2017-11-22 LAB — GC/CHLAMYDIA PROBE AMP (~~LOC~~) NOT AT ARMC
CHLAMYDIA, DNA PROBE: NEGATIVE
Neisseria Gonorrhea: NEGATIVE

## 2017-12-01 ENCOUNTER — Encounter: Payer: Self-pay | Admitting: Adult Health

## 2017-12-01 ENCOUNTER — Ambulatory Visit (INDEPENDENT_AMBULATORY_CARE_PROVIDER_SITE_OTHER): Payer: Self-pay | Admitting: Adult Health

## 2017-12-01 VITALS — BP 130/85 | HR 77 | Ht 65.0 in | Wt 288.0 lb

## 2017-12-01 DIAGNOSIS — N898 Other specified noninflammatory disorders of vagina: Secondary | ICD-10-CM

## 2017-12-01 NOTE — Progress Notes (Signed)
  Subjective:     Patient ID: Roberta Guerrero, female   DOB: [birth year], 22 y.o.   MRN: [id number]  HPI Roberta Guerrero is a 22 year old black female, in for check on having had vaginal discharge and stomach pain, but it has resolved. She was seen in ER 11/20/17 and had negative urine, wet prep and GC/CHL, and she said she got shot of rocephin in ER. She was treated for chlamydia in May.  Review of Systems Has had stomach pain esp left side, was seen in ER 8/10 Has had vaginal discharge and vulva irritation and itching, but none today Has had diarrhea with some blood but none lately, was seen in ER  Reviewed past medical,surgical, social and family history. Reviewed medications and allergies.     Objective:   Physical Exam BP 130/85 (BP Location: Left Arm, Patient Position: Sitting, Cuff Size: Large)   Pulse 77   Ht 5\' 5"  (1.651 m)   Wt 288 lb (130.6 kg)   LMP 11/09/2017   BMI 47.93 kg/m  Skin warm and dry.Pelvic: external genitalia is normal in appearance no lesions, vagina: white normal appearing discharge without odor,urethra has no lesions or masses noted, cervix:smooth. uterus: normal size, shape and contour, non tender, no masses felt, adnexa: no masses or tenderness noted. Bladder is non tender and no masses felt.  Abdomen is soft and non tender. Examination chaperoned by Federico FlakePeggy Dones CMA.    Assessment:     1. Vaginal discharge       Plan:     No not shave or use shower gel F/U prn

## 2018-01-07 ENCOUNTER — Telehealth: Payer: Self-pay | Admitting: Adult Health

## 2018-01-07 MED ORDER — METRONIDAZOLE 500 MG PO TABS
500.0000 mg | ORAL_TABLET | Freq: Two times a day (BID) | ORAL | 0 refills | Status: DC
Start: 2018-01-07 — End: 2018-02-16

## 2018-01-07 NOTE — Telephone Encounter (Signed)
Spoke with pt. Pt was seen 12/01/17. Pt still has a fishy smell. Some vaginal discharge. Pt has no insurance and don't have the money to pay to come in. What do you advise? Thanks!! JSY

## 2018-01-07 NOTE — Telephone Encounter (Signed)
Left message will rx flagyl 

## 2018-02-16 ENCOUNTER — Other Ambulatory Visit: Payer: Self-pay | Admitting: Adult Health

## 2018-02-16 ENCOUNTER — Telehealth: Payer: Self-pay | Admitting: Adult Health

## 2018-02-16 MED ORDER — METRONIDAZOLE 500 MG PO TABS: 500.0000 mg | ORAL_TABLET | Freq: Two times a day (BID) | ORAL | 1 refills | Status: DC

## 2018-02-16 NOTE — Progress Notes (Signed)
Refill flagyl  

## 2018-02-16 NOTE — Telephone Encounter (Signed)
Spoke with pt. Pt states her vagina has an odor. Some discharge. Keeps having this issue. Pt was prescribed Flagyl the end of September and that helped with symptoms. Please advise. Thanks!! JSY

## 2018-02-16 NOTE — Telephone Encounter (Signed)
Pt has vaginal odor, again , use condoms, will rx flagyl

## 2018-02-16 NOTE — Telephone Encounter (Signed)
Patient called stating that she would like a call back from Jennifer, Patient did not state the reason why. Please contact pt °

## 2018-03-14 ENCOUNTER — Other Ambulatory Visit: Payer: Self-pay

## 2018-03-14 ENCOUNTER — Observation Stay (HOSPITAL_BASED_OUTPATIENT_CLINIC_OR_DEPARTMENT_OTHER)
Admission: EM | Admit: 2018-03-14 | Discharge: 2018-03-15 | Disposition: A | Discharge status: Home / Self Care | Payer: Self-pay | Attending: Internal Medicine | Admitting: Internal Medicine

## 2018-03-14 ENCOUNTER — Emergency Department (HOSPITAL_BASED_OUTPATIENT_CLINIC_OR_DEPARTMENT_OTHER): Payer: Self-pay

## 2018-03-14 ENCOUNTER — Encounter (HOSPITAL_BASED_OUTPATIENT_CLINIC_OR_DEPARTMENT_OTHER): Payer: Self-pay | Admitting: *Deleted

## 2018-03-14 DIAGNOSIS — I10 Essential (primary) hypertension: Secondary | ICD-10-CM | POA: Diagnosis present

## 2018-03-14 DIAGNOSIS — J45901 Unspecified asthma with (acute) exacerbation: Secondary | ICD-10-CM | POA: Diagnosis present

## 2018-03-14 DIAGNOSIS — Z79899 Other long term (current) drug therapy: Secondary | ICD-10-CM | POA: Insufficient documentation

## 2018-03-14 DIAGNOSIS — Z791 Long term (current) use of non-steroidal anti-inflammatories (NSAID): Secondary | ICD-10-CM | POA: Insufficient documentation

## 2018-03-14 DIAGNOSIS — J45909 Unspecified asthma, uncomplicated: Secondary | ICD-10-CM | POA: Diagnosis present

## 2018-03-14 DIAGNOSIS — Z6841 Body Mass Index (BMI) 40.0 and over, adult: Secondary | ICD-10-CM | POA: Insufficient documentation

## 2018-03-14 DIAGNOSIS — R000 Tachycardia, unspecified: Secondary | ICD-10-CM

## 2018-03-14 DIAGNOSIS — J4531 Mild persistent asthma with (acute) exacerbation: Principal | ICD-10-CM | POA: Insufficient documentation

## 2018-03-14 DIAGNOSIS — E876 Hypokalemia: Secondary | ICD-10-CM | POA: Insufficient documentation

## 2018-03-14 LAB — CBC WITH DIFFERENTIAL/PLATELET
Abs Immature Granulocytes: 0.01 10*3/uL (ref 0.00–0.07)
Basophils Absolute: 0 10*3/uL (ref 0.0–0.1)
Basophils Relative: 1 %
Eosinophils Absolute: 0.4 10*3/uL (ref 0.0–0.5)
Eosinophils Relative: 7 %
HCT: 40.4 % (ref 36.0–46.0)
Hemoglobin: 12.8 g/dL (ref 12.0–15.0)
Immature Granulocytes: 0 %
LYMPHS ABS: 1.9 10*3/uL (ref 0.7–4.0)
Lymphocytes Relative: 36 %
MCH: 26.6 pg (ref 26.0–34.0)
MCHC: 31.7 g/dL (ref 30.0–36.0)
MCV: 84 fL (ref 80.0–100.0)
Monocytes Absolute: 0.5 10*3/uL (ref 0.1–1.0)
Monocytes Relative: 9 %
Neutro Abs: 2.5 10*3/uL (ref 1.7–7.7)
Neutrophils Relative %: 47 %
Platelets: 256 10*3/uL (ref 150–400)
RBC: 4.81 MIL/uL (ref 3.87–5.11)
RDW: 13.1 % (ref 11.5–15.5)
WBC: 5.3 10*3/uL (ref 4.0–10.5)
nRBC: 0 % (ref 0.0–0.2)

## 2018-03-14 LAB — BASIC METABOLIC PANEL
Anion gap: 8 (ref 5–15)
BUN: 7 mg/dL (ref 6–20)
CO2: 26 mmol/L (ref 22–32)
Calcium: 9 mg/dL (ref 8.9–10.3)
Chloride: 103 mmol/L (ref 98–111)
Creatinine, Ser: 1.03 mg/dL — ABNORMAL HIGH (ref 0.44–1.00)
GFR calc Af Amer: >60 mL/min (ref >60)
GFR calc non Af Amer: >60 mL/min (ref >60)
Glucose, Bld: 113 mg/dL — ABNORMAL HIGH (ref 70–99)
Potassium: 2.9 mmol/L — ABNORMAL LOW (ref 3.5–5.1)
Sodium: 137 mmol/L (ref 135–145)

## 2018-03-14 LAB — INFLUENZA PANEL BY PCR (TYPE A & B)
INFLAPCR: NEGATIVE
Influenza B By PCR: NEGATIVE

## 2018-03-14 LAB — GROUP A STREP BY PCR: Group A Strep by PCR: NOT DETECTED

## 2018-03-14 LAB — PREGNANCY, URINE: PREG TEST UR: NEGATIVE

## 2018-03-14 MED ORDER — ACETAMINOPHEN 650 MG RE SUPP: 650.0000 mg | Freq: Four times a day (QID) | RECTAL | Status: DC | PRN

## 2018-03-14 MED ORDER — MAGNESIUM SULFATE 2 GM/50ML IV SOLN
2.0000 g | Freq: Once | INTRAVENOUS | Status: AC
  Administered 2018-03-14: 2 g via INTRAVENOUS
  Filled 2018-03-14: qty 50

## 2018-03-14 MED ORDER — IPRATROPIUM-ALBUTEROL 0.5-2.5 (3) MG/3ML IN SOLN
3.0000 mL | Freq: Four times a day (QID) | RESPIRATORY_TRACT | Status: DC
  Administered 2018-03-14 – 2018-03-15 (×3): 3 mL via RESPIRATORY_TRACT
  Filled 2018-03-14 (×3): qty 3

## 2018-03-14 MED ORDER — ACETAMINOPHEN 325 MG PO TABS
650.0000 mg | ORAL_TABLET | Freq: Four times a day (QID) | ORAL | Status: DC | PRN
  Administered 2018-03-14: 650 mg via ORAL
  Filled 2018-03-14: qty 2

## 2018-03-14 MED ORDER — IPRATROPIUM-ALBUTEROL 0.5-2.5 (3) MG/3ML IN SOLN
RESPIRATORY_TRACT | Status: AC
  Administered 2018-03-14: 3 mL
  Filled 2018-03-14: qty 3

## 2018-03-14 MED ORDER — IPRATROPIUM-ALBUTEROL 0.5-2.5 (3) MG/3ML IN SOLN
3.0000 mL | RESPIRATORY_TRACT | Status: DC
  Administered 2018-03-14: 3 mL via RESPIRATORY_TRACT
  Filled 2018-03-14: qty 3

## 2018-03-14 MED ORDER — INFLUENZA VAC SPLIT QUAD 0.5 ML IM SUSY: 0.5000 mL | PREFILLED_SYRINGE | INTRAMUSCULAR | Status: DC

## 2018-03-14 MED ORDER — ALBUTEROL SULFATE (2.5 MG/3ML) 0.083% IN NEBU
INHALATION_SOLUTION | RESPIRATORY_TRACT | Status: AC
  Administered 2018-03-14: 2.5 mg
  Filled 2018-03-14: qty 3

## 2018-03-14 MED ORDER — SODIUM CHLORIDE 0.9 % IV SOLN
INTRAVENOUS | Status: AC
  Administered 2018-03-14: 20:00:00 via INTRAVENOUS

## 2018-03-14 MED ORDER — MAGNESIUM SULFATE 50 % IJ SOLN: 1.0000 g | Freq: Once | INTRAMUSCULAR | Status: DC

## 2018-03-14 MED ORDER — ONDANSETRON HCL 4 MG/2ML IJ SOLN: 4.0000 mg | Freq: Four times a day (QID) | INTRAMUSCULAR | Status: DC | PRN

## 2018-03-14 MED ORDER — METHYLPREDNISOLONE SODIUM SUCC 125 MG IJ SOLR
60.0000 mg | Freq: Two times a day (BID) | INTRAMUSCULAR | Status: AC
  Administered 2018-03-14 – 2018-03-15 (×2): 60 mg via INTRAVENOUS
  Filled 2018-03-14 (×2): qty 2

## 2018-03-14 MED ORDER — POTASSIUM CHLORIDE CRYS ER 20 MEQ PO TBCR
40.0000 meq | EXTENDED_RELEASE_TABLET | Freq: Once | ORAL | Status: AC
  Administered 2018-03-14: 40 meq via ORAL
  Filled 2018-03-14: qty 2

## 2018-03-14 MED ORDER — ALBUTEROL (5 MG/ML) CONTINUOUS INHALATION SOLN
10.0000 mg/h | INHALATION_SOLUTION | Freq: Once | RESPIRATORY_TRACT | Status: AC
  Administered 2018-03-14: 10 mg/h via RESPIRATORY_TRACT
  Filled 2018-03-14: qty 20

## 2018-03-14 MED ORDER — PNEUMOCOCCAL VAC POLYVALENT 25 MCG/0.5ML IJ INJ
0.5000 mL | INJECTION | INTRAMUSCULAR | Status: DC
  Filled 2018-03-14: qty 0.5

## 2018-03-14 MED ORDER — METHYLPREDNISOLONE SODIUM SUCC 125 MG IJ SOLR
125.0000 mg | Freq: Once | INTRAMUSCULAR | Status: AC
  Administered 2018-03-14: 125 mg via INTRAVENOUS
  Filled 2018-03-14: qty 2

## 2018-03-14 MED ORDER — ONDANSETRON HCL 4 MG PO TABS: 4.0000 mg | ORAL_TABLET | Freq: Four times a day (QID) | ORAL | Status: DC | PRN

## 2018-03-14 MED ORDER — PREDNISONE 20 MG PO TABS: 40.0000 mg | ORAL_TABLET | Freq: Every day | ORAL | Status: DC

## 2018-03-14 MED ORDER — SODIUM CHLORIDE 0.9 % IV BOLUS
1000.0000 mL | Freq: Once | INTRAVENOUS | Status: AC
  Administered 2018-03-14: 1000 mL via INTRAVENOUS

## 2018-03-14 MED ORDER — ENOXAPARIN SODIUM 40 MG/0.4ML ~~LOC~~ SOLN
40.0000 mg | SUBCUTANEOUS | Status: DC
  Administered 2018-03-14: 40 mg via SUBCUTANEOUS
  Filled 2018-03-14: qty 0.4

## 2018-03-14 MED ORDER — ALBUTEROL SULFATE (2.5 MG/3ML) 0.083% IN NEBU: 2.5000 mg | INHALATION_SOLUTION | RESPIRATORY_TRACT | Status: DC | PRN

## 2018-03-14 MED ORDER — HYDROCODONE-ACETAMINOPHEN 5-325 MG PO TABS
1.0000 | ORAL_TABLET | ORAL | Status: DC | PRN
  Administered 2018-03-15: 2 via ORAL
  Filled 2018-03-14: qty 2

## 2018-03-14 MED ORDER — LEVALBUTEROL HCL 0.63 MG/3ML IN NEBU
0.6300 mg | INHALATION_SOLUTION | Freq: Four times a day (QID) | RESPIRATORY_TRACT | Status: DC | PRN
  Administered 2018-03-15: 0.63 mg via RESPIRATORY_TRACT

## 2018-03-14 NOTE — ED Provider Notes (Signed)
[facility] EMERGENCY DEPARTMENT Provider Note   CSN: [id number] Arrival date & time: 03/14/18  1322     History   Chief Complaint Chief Complaint  Patient presents with  . Cough    HPI Roberta Guerrero is a 22 y.o. female.  The history is provided by the patient.  Cough  This is a new problem. The current episode started yesterday. The problem has not changed since onset.The cough is non-productive. There has been no fever. The fever has been present for less than 1 day. Associated symptoms include myalgias, shortness of breath and wheezing. Pertinent negatives include no chest pain, no chills, no sweats, no weight loss, no ear congestion, no ear pain, no headaches, no rhinorrhea, no sore throat and no eye redness. Treatments tried: inhaler. The treatment provided mild relief. Her past medical history is significant for asthma.    Past Medical History:  Diagnosis Date  . Allergic rhinitis   . Asthma   . Headache   . Hypertension   . Menstrual migraine     Patient Active Problem List   Diagnosis Date Noted  . Asthma 03/14/2018  . Chlamydia 09/09/2017  . Chronic hypertension 09/03/2017  . Nexplanon in place 09/03/2017  . Screen for STD (sexually transmitted disease) 09/03/2017  . Family planning 09/03/2017  . Encounter for gynecological examination with Papanicolaou smear of cervix 09/03/2017  . Nexplanon insertion 07/18/2014  . Essential hypertension, benign 03/23/2014  . Food allergy 07/24/2013  . Obesity, morbid (HCC) 06/14/2013  . 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 Gastritis 02/26/2013  . Eczematous dermatitis of eyelid 09/26/2012  . Eczema 09/26/2012  . Asthma, chronic 08/09/2012  . Allergic rhinitis 08/09/2012  . Rash and nonspecific skin eruption 08/09/2012    Past Surgical History:  Procedure Laterality Date  . CHOLECYSTECTOMY    . MOUTH SURGERY    . SKIN BIOPSY       OB History    Gravida  0   Para  0   Term  0   Preterm  0   AB    0   Living  0     SAB  0   TAB  0   Ectopic  0   Multiple  0   Live Births  0            Home Medications    Prior to Admission medications   Medication Sig Start Date End Date Taking? Authorizing Provider  Albuterol (PROVENTIL IN) Inhale into the lungs.   Yes [provider]  hydrochlorothiazide (MICROZIDE) 12.5 MG capsule Take 1 capsule (12.5 mg total) by mouth daily. 09/03/17  Yes Cyril MourningGriffin, Jennifer A, NP  EPINEPHrine 0.3 mg/0.3 mL IJ SOAJ injection Inject 0.3 mg into the muscle once.    [provider]  metroNIDAZOLE (FLAGYL) 500 MG tablet Take 1 tablet (500 mg total) by mouth 2 (two) times daily. 02/16/18   Adline PotterGriffin, Jennifer A, NP    Family History Family History  Problem Relation Age of Onset  . Hypertension Mother   . Hypertension Father   . Hyperlipidemia Father   . Hypertension Maternal Grandmother   . Alcohol abuse Maternal Grandfather     Social History Social History   Tobacco Use  . Smoking status: Never Smoker  . Smokeless tobacco: Never Used  Substance Use Topics  . Alcohol use: Yes    Comment: occasional  . Drug use: No     Allergies   Pineapple; Corn-containing products; and Other  Review of Systems Review of Systems  Constitutional: Negative for chills, fever and weight loss.  HENT: Negative for ear pain, rhinorrhea and sore throat.   Eyes: Negative for pain, redness and visual disturbance.  Respiratory: Positive for cough, chest tightness, shortness of breath and wheezing.   Cardiovascular: Negative for chest pain and palpitations.  Gastrointestinal: Negative for abdominal pain and vomiting.  Genitourinary: Negative for dysuria and hematuria.  Musculoskeletal: Positive for myalgias. Negative for arthralgias and back pain.  Skin: Negative for color change and rash.  Neurological: Negative for seizures, syncope and headaches.  All other systems reviewed and are negative.    Physical Exam Updated Vital  Signs  ED Triage Vitals  Enc Vitals Group     BP 03/14/18 1327 (!) 153/111     Pulse Rate 03/14/18 1327 (!) 110     Resp 03/14/18 1327 20     Temp 03/14/18 1327 98.4 F (36.9 C)     Temp Source 03/14/18 1327 Oral     SpO2 03/14/18 1327 98 %     Weight 03/14/18 1325 286 lb (129.7 kg)     Height 03/14/18 1325 5\' 5"  (1.651 m)     Head Circumference --      Peak Flow --      Pain Score 03/14/18 1325 8     Pain Loc --      Pain Edu? --      Excl. in GC? --     Physical Exam  Constitutional: She is oriented to person, place, and time. She appears well-developed and well-nourished. No distress.  HENT:  Head: Normocephalic and atraumatic.  Eyes: Pupils are equal, round, and reactive to light. Conjunctivae and EOM are normal.  Neck: Normal range of motion. Neck supple.  Cardiovascular: Regular rhythm and intact distal pulses. Tachycardia present.  No murmur heard. Pulmonary/Chest: She has wheezes.  Poor effort, diminished breath sounds with poor air movement  Abdominal: Soft. She exhibits no distension. There is no tenderness.  Musculoskeletal: Normal range of motion. She exhibits no edema.  Neurological: She is alert and oriented to person, place, and time.  Skin: Skin is warm and dry. Capillary refill takes less than 2 seconds.  Psychiatric: She has a normal mood and affect.  Nursing note and vitals reviewed.    ED Treatments / Results  Labs (all labs ordered are listed, but only abnormal results are displayed) Labs Reviewed  BASIC METABOLIC PANEL - Abnormal; Notable for the following components:      Result Value   Potassium 2.9 (*)    Glucose, Bld 113 (*)    Creatinine, Ser 1.03 (*)    All other components within normal limits  CBC WITH DIFFERENTIAL/PLATELET  PREGNANCY, URINE  INFLUENZA PANEL BY PCR (TYPE A & B)    EKG None  Radiology Dg Chest Portable 1 View  Result Date: 03/14/2018 CLINICAL DATA:  Persistent cough.  Shortness of breath. EXAM: PORTABLE CHEST 1  VIEW COMPARISON:  June 01, 2016 FINDINGS: The heart size and mediastinal contours are within normal limits. Both lungs are clear. The visualized skeletal structures are unremarkable. IMPRESSION: No active disease. Electronically Signed   By: Gerome Sam III M.D   On: 03/14/2018 14:24    Procedures .Critical Care Performed by: Virgina Norfolk, DO Authorized by: Virgina Norfolk, DO   Critical care provider statement:    Critical care time (minutes):  40   Critical care time was exclusive of:  Separately billable procedures and treating other patients  and teaching time   Critical care was necessary to treat or prevent imminent or life-threatening deterioration of the following conditions:  Respiratory failure   Critical care was time spent personally by me on the following activities:  Development of treatment plan with patient or surrogate, discussions with primary provider, evaluation of patient's response to treatment, examination of patient, ordering and performing treatments and interventions, ordering and review of laboratory studies, ordering and review of radiographic studies, pulse oximetry, re-evaluation of patient's condition and review of old charts   I assumed direction of critical care for this patient from another provider in my specialty: no     (including critical care time)  Medications Ordered in ED Medications  sodium chloride 0.9 % bolus 1,000 mL (1,000 mLs Intravenous New Bag/Given 03/14/18 1455)  magnesium sulfate IVPB 2 g 50 mL (2 g Intravenous New Bag/Given 03/14/18 1514)  potassium chloride SA (K-DUR,KLOR-CON) CR tablet 40 mEq (has no administration in time range)  ipratropium-albuterol (DUONEB) 0.5-2.5 (3) MG/3ML nebulizer solution 3 mL (has no administration in time range)  albuterol (PROVENTIL) (2.5 MG/3ML) 0.083% nebulizer solution (2.5 mg  Given 03/14/18 1333)  ipratropium-albuterol (DUONEB) 0.5-2.5 (3) MG/3ML nebulizer solution (3 mLs  Given 03/14/18 1333)   albuterol (PROVENTIL,VENTOLIN) solution continuous neb (10 mg/hr Nebulization Given 03/14/18 1424)  methylPREDNISolone sodium succinate (SOLU-MEDROL) 125 mg/2 mL injection 125 mg (125 mg Intravenous Given 03/14/18 1455)     Initial Impression / Assessment and Plan / ED Course  I have reviewed the triage vital signs and the nursing notes.  Pertinent labs & imaging results that were available during my care of the patient were reviewed by me and considered in my medical decision making (see chart for details).     TAMIRAH GEORGE is a 22 year old female with history of asthma who presents to the ED with cough, shortness of breath, asthma exacerbation.  Patient with tachycardia upon arrival.  Otherwise vitals unremarkable.  100% on room air.  Patient with symptoms for the last 2 days without improvement with her albuterol inhaler at home.  Patient does not have frequent exacerbations of asthma.  Has had viral type symptoms for the last 2 days.  No known fever.  Patient with diminished breath sounds on exam with poor movement and poor effort.  She has wheezing throughout.  Has increased work of breathing.  No signs of volume overload on exam.  Patient with likely asthma exacerbation likely from viral process.  Will obtain chest x-ray, basic labs, flu testing.  Patient given duo nebs x2 already prior to my evaluation.  Will start continuous albuterol, IV Solu-Medrol, IV magnesium, IV saline bolus.  Patient with no signs of pneumonia, pneumothorax, pleural effusion.  No significant leukocytosis, anemia, electrolyte abnormality except for low potassium.  Potassium was repleted.  Patient had improvement following breathing treatments.  She remained on room air however was still symptomatic.  Patient has had improvement but still with asthma symptoms, believe she would benefit from observation stay given that she is still symptomatic for more treatments.  Believe she will have improvement with further breathing  treatments as well as steroid treatment.  Remained hemodynamically stable without my care.  Patient admitted to hospitalist service and was transferred for further care. Pt awaiting transfer and signed out to oncoming ED staff.  This chart was dictated using voice recognition software.  Despite best efforts to proofread,  errors can occur which can change the documentation meaning.   Final Clinical Impressions(s) / ED Diagnoses  Final diagnoses:  Mild persistent asthma with exacerbation    ED Discharge Orders    None       Virgina Norfolk, DO 03/14/18 1537

## 2018-03-14 NOTE — ED Triage Notes (Signed)
Cough x 2 days. Hx of asthma. She has been using her inhaler more than normal for her.

## 2018-03-14 NOTE — ED Notes (Signed)
Report given to Shanda BumpsJessica, [facility]

## 2018-03-14 NOTE — Plan of Care (Signed)
°  Problem: Coping: °Goal: Level of anxiety will decrease °Outcome: Progressing °  °

## 2018-03-14 NOTE — H&P (Addendum)
Roberta Guerrero ZOX:[id number] DOB: [birth year] DOA: 03/14/2018     PCP: Merlyn Albert, MD   Outpatient Specialists:   NONE    Patient arrived to ER on 03/14/18 at 1322  Patient coming from: home Lives  With family    Chief Complaint:  Chief Complaint  Patient presents with  . Cough    HPI: Roberta Guerrero is a 22 y.o. female with medical history significant of asthma, HTN, headache    Presented with a 2 day  history of cough no Chest pain some wheezing, myalgia shortness of breath He denied any fever attempted to use albuterol inhaler at home but did not seem to help she have had a viral type of illness for the past few days.  She only needs to use inhaler 2-3 a month She only needed steroids twice in her life.  She moved her Grandmother out of her house this weekend and had significant exposure to dust.  No sick contacts at home   History of asthma in the past did not require intubation   While in ER: Initially tachypneic up to 28 but breathing 100% on room air in noted increased work of breathing patient was given a dose of magnesium albuterol nebulizer was administered and a dose of IV Solu-Medrol Noted to have sodium 2.9 and was repleted Flu testing was negative Chest x-ray showed no evidence of pneumonia  The following Work up has been ordered so far:  Orders Placed This Encounter  Procedures  . Critical Care  . DG Chest Portable 1 View  . Influenza panel by PCR (type A & B)  . CBC with Differential  . Basic metabolic panel  . Pregnancy, urine  . Cardiac Monitoring  . Consult to hospitalist  . Droplet precaution  . Insert peripheral IV  . Place in observation (patient's expected length of stay will be less than 2 midnights)    Following Medications were ordered in ER: Medications  ipratropium-albuterol (DUONEB) 0.5-2.5 (3) MG/3ML nebulizer solution 3 mL (has no administration in time range)  albuterol (PROVENTIL) (2.5 MG/3ML) 0.083% nebulizer  solution 2.5 mg (has no administration in time range)  albuterol (PROVENTIL) (2.5 MG/3ML) 0.083% nebulizer solution (2.5 mg  Given 03/14/18 1333)  ipratropium-albuterol (DUONEB) 0.5-2.5 (3) MG/3ML nebulizer solution (3 mLs  Given 03/14/18 1333)  albuterol (PROVENTIL,VENTOLIN) solution continuous neb (10 mg/hr Nebulization Given 03/14/18 1424)  methylPREDNISolone sodium succinate (SOLU-MEDROL) 125 mg/2 mL injection 125 mg (125 mg Intravenous Given 03/14/18 1455)  sodium chloride 0.9 % bolus 1,000 mL (0 mLs Intravenous Stopped 03/14/18 1612)  magnesium sulfate IVPB 2 g 50 mL (0 g Intravenous Stopped 03/14/18 1612)  potassium chloride SA (K-DUR,KLOR-CON) CR tablet 40 mEq (40 mEq Oral Given 03/14/18 1545)    Significant initial  Findings: Abnormal Labs Reviewed  BASIC METABOLIC PANEL - Abnormal; Notable for the following components:      Result Value   Potassium 2.9 (*)    Glucose, Bld 113 (*)    Creatinine, Ser 1.03 (*)    All other components within normal limits     Lactic Acid, Venous  Na 137 K  2.9  Cr  1.03 stable,  Up from baseline see below Lab Results  Component Value Date   CREATININE 1.03 (H) 03/14/2018   CREATININE 0.88 11/20/2017   CREATININE 0.84 10/11/2015      WBC  5.3  HG/HCT  stable,   from baseline see below    Component Value Date/Time  HGB 12.8 03/14/2018 1435   HGB 11.8 07/14/2016 1700   HCT 40.4 03/14/2018 1435   HCT 37.0 07/14/2016 1700       UA  not ordered   CXR -  NON acute   ECG:  Not ordered    ED Triage Vitals  Enc Vitals Group     BP 03/14/18 1327 (!) 153/111     Pulse Rate 03/14/18 1327 (!) 110     Resp 03/14/18 1327 20     Temp 03/14/18 1327 98.4 F (36.9 C)     Temp Source 03/14/18 1327 Oral     SpO2 03/14/18 1327 98 %     Weight 03/14/18 1325 286 lb (129.7 kg)     Height 03/14/18 1325 5\' 5"  (1.651 m)     Head Circumference --      Peak Flow --      Pain Score 03/14/18 1325 8     Pain Loc --      Pain Edu? --      Excl. in  GC? --   TMAX(24)@       Latest  Blood pressure (!) 156/89, pulse (!) 127, temperature 98 F (36.7 C), temperature source Oral, resp. rate (!) 28, height 5\' 5"  (1.651 m), weight 129.7 kg, last menstrual period 03/11/2018, SpO2 99 %.    Hospitalist was called for admission for asthma exacerbation   Review of Systems:    Pertinent positives include: Wheezing cough shortness of breath  nasal congestion, post nasal drip,    Constitutional:  No weight loss, night sweats, Fevers, chills, fatigue, weight loss  HEENT:  No headaches, Difficulty swallowing,Tooth/dental problems,Sore throat,  No sneezing, itching, ear ache,Cardio-vascular:  No chest pain, Orthopnea, PND, anasarca, dizziness, palpitations.no Bilateral lower extremity swelling  GI:  No heartburn, indigestion, abdominal pain, nausea, vomiting, diarrhea, change in bowel habits, loss of appetite, melena, blood in stool, hematemesis Resp:     No dyspnea on exertion, No excess mucus No coughing up of blood.No change in color of mucus Skin:  no rash or lesions. No jaundice GU:  no dysuria, change in color of urine, no urgency or frequency. No straining to urinate.  No flank pain.  Musculoskeletal:  No joint pain or no joint swelling. No decreased range of motion. No back pain.  Psych:  No change in mood or affect. No depression or anxiety. No memory loss.  Neuro: no localizing neurological complaints, no tingling, no weakness, no double vision, no gait abnormality, no slurred speech, no confusion  All systems reviewed and apart from HOPI all are negative  Past Medical History:   Past Medical History:  Diagnosis Date  . Allergic rhinitis   . Asthma   . Headache   . Hypertension   . Menstrual migraine       Past Surgical History:  Procedure Laterality Date  . CHOLECYSTECTOMY    . MOUTH SURGERY    . SKIN BIOPSY      Social History:  Ambulatory   independently     reports that she has never smoked. She has never  used smokeless tobacco. She reports that she drinks alcohol. She reports that she does not use drugs.   Family History:   Family History  Problem Relation Age of Onset  . Hypertension Mother   . Hypertension Father   . Hyperlipidemia Father   . Hypertension Maternal Grandmother   . Alcohol abuse Maternal Grandfather     Allergies: Allergies  Allergen Reactions  .  Pineapple Other (See Comments)    Makes tongue swell  . Corn-Containing Products Rash  . Other Rash    Nuts cause a rash.      Prior to Admission medications   Medication Sig Start Date End Date Taking? Authorizing Provider  Albuterol (PROVENTIL IN) Inhale into the lungs.   Yes [provider]  hydrochlorothiazide (MICROZIDE) 12.5 MG capsule Take 1 capsule (12.5 mg total) by mouth daily. 09/03/17  Yes Cyril Mourning A, NP  EPINEPHrine 0.3 mg/0.3 mL IJ SOAJ injection Inject 0.3 mg into the muscle once.    [provider]  metroNIDAZOLE (FLAGYL) 500 MG tablet Take 1 tablet (500 mg total) by mouth 2 (two) times daily. 02/16/18   Adline Potter, NP   Physical Exam: Blood pressure (!) 156/89, pulse (!) 127, temperature 98 F (36.7 C), temperature source Oral, resp. rate (!) 28, height 5\' 5"  (1.651 m), weight 129.7 kg, last menstrual period 03/11/2018, SpO2 99 %. 1. General:  in No Acute distress  well  -appearing 2. Psychological: Alert and     Oriented 3. Head/ENT:   Dry Mucous Membranes                          Head Non traumatic, neck supple                          Normal   Dentition 4. SKIN: decreased Skin turgor,  Skin clean Dry and intact no rash 5. Heart: Regular rate and rhythm no  Murmur, no Rub or gallop 6. Lungs:  Some wheezes or crackles   7. Abdomen: Soft,  non-tender, Non distended     bowel sounds present 8. Lower extremities: no clubbing, cyanosis, or  edema 9. Neurologically Grossly intact, moving all 4 extremities equally    10. MSK: Normal range of motion   LABS:       Recent Labs  Lab 03/14/18 1435  WBC 5.3  NEUTROABS 2.5  HGB 12.8  HCT 40.4  MCV 84.0  PLT 256   Basic Metabolic Panel: Recent Labs  Lab 03/14/18 1435  NA 137  K 2.9*  CL 103  CO2 26  GLUCOSE 113*  BUN 7  CREATININE 1.03*  CALCIUM 9.0      No results for input(s): AST, ALT, ALKPHOS, BILITOT, PROT, ALBUMIN in the last 168 hours. No results for input(s): LIPASE, AMYLASE in the last 168 hours. No results for input(s): AMMONIA in the last 168 hours.    HbA1C: No results for input(s): HGBA1C in the last 72 hours. CBG: No results for input(s): GLUCAP in the last 168 hours.    Urine analysis:    Component Value Date/Time   COLORURINE YELLOW 11/21/2017 1421   APPEARANCEUR HAZY (A) 11/21/2017 1421   LABSPEC 1.020 11/21/2017 1421   PHURINE 6.0 11/21/2017 1421   GLUCOSEU NEGATIVE 11/21/2017 1421   HGBUR NEGATIVE 11/21/2017 1421   BILIRUBINUR NEGATIVE 11/21/2017 1421   KETONESUR NEGATIVE 11/21/2017 1421   PROTEINUR NEGATIVE 11/21/2017 1421   UROBILINOGEN 1.0 08/26/2012 2342   NITRITE NEGATIVE 11/21/2017 1421   LEUKOCYTESUR TRACE (A) 11/21/2017 1421     Cultures:    Component Value Date/Time   SDES  05/14/2017 1108    THROAT Performed at [facility], [address], [place], [place] [address]    SPECREQUEST  05/14/2017 1108    NONE Reflexed from [id number] Performed at [facility], [address], [place], [place] [address]    CULT  05/14/2017 1108    NO GROUP A STREP (S.PYOGENES) ISOLATED Performed at [facility], [address] [place], [place] [address]    REPTSTATUS 05/17/2017 FINAL 05/14/2017 1108     Radiological Exams on Admission: Dg Chest Portable 1 View  Result Date: 03/14/2018 CLINICAL DATA:  Persistent cough.  Shortness of breath. EXAM: PORTABLE CHEST 1 VIEW COMPARISON:  June 01, 2016 FINDINGS: The heart size and mediastinal contours are within normal limits. Both lungs are clear. The visualized skeletal structures are unremarkable.  IMPRESSION: No active disease. Electronically Signed   By: Gerome Sam III M.D   On: 03/14/2018 14:24    Chart has been reviewed    Assessment/Plan  22 y.o. female with medical history significant of asthma, HTN, headache  Admitted for asthma exacerbation  Present on Admission:   . Asthma, chronic, unspecified asthma severity, with acute exacerbation -  -  - Will initiate: Steroid taper     XopenexPRN, - scheduled duoneb,  -  Could benefit from LABA/ inhaled corticosteroid at discharge   Titrate O2 to saturation >90%. Follow patients respiratory status.  Order respiratory panel  influenza PCR neg    Currently mentating well no evidence of symptomatic hypercarbia tachyCardia persistent in the setting of use of albuterol will rehydrate and monitor on telemetry   . Essential hypertension, benign - stable will rehydrate and restart hydrochlorothiazide when able  Hypokalemia- will replace recheck and check magnesium level  Other plan as per orders.  DVT prophylaxis:   Lovenox     Code Status:  FULL CODE   as per patient  I had personally discussed CODE STATUS with patient and family   Family Communication:   Family   at  Bedside  plan of care was discussed with father   Disposition Plan:    To home once workup is complete and patient is stable                    Admission status:   Obs    Level of care   tele  24H           Pate Aylward 03/14/2018, 8:23 PM    [facility]  Pager [phone]   after 2 AM please page floor coverage PA If 7AM-7PM, please contact the day team taking care of the patient  [email or web address]  Password [email or web address]

## 2018-03-15 DIAGNOSIS — J45901 Unspecified asthma with (acute) exacerbation: Secondary | ICD-10-CM

## 2018-03-15 DIAGNOSIS — I10 Essential (primary) hypertension: Secondary | ICD-10-CM

## 2018-03-15 LAB — COMPREHENSIVE METABOLIC PANEL
ALK PHOS: 69 U/L (ref 38–126)
ALT: 22 U/L (ref 0–44)
ANION GAP: 9 (ref 5–15)
AST: 22 U/L (ref 15–41)
Albumin: 3.9 g/dL (ref 3.5–5.0)
BILIRUBIN TOTAL: 0.5 mg/dL (ref 0.3–1.2)
BUN: 7 mg/dL (ref 6–20)
CO2: 21 mmol/L — ABNORMAL LOW (ref 22–32)
Calcium: 8.8 mg/dL — ABNORMAL LOW (ref 8.9–10.3)
Chloride: 108 mmol/L (ref 98–111)
Creatinine, Ser: 0.68 mg/dL (ref 0.44–1.00)
GFR calc Af Amer: >60 mL/min (ref >60)
GFR calc non Af Amer: >60 mL/min (ref >60)
Glucose, Bld: 147 mg/dL — ABNORMAL HIGH (ref 70–99)
Potassium: 4 mmol/L (ref 3.5–5.1)
Sodium: 138 mmol/L (ref 135–145)
TOTAL PROTEIN: 7.8 g/dL (ref 6.5–8.1)

## 2018-03-15 LAB — RESPIRATORY PANEL BY PCR
Adenovirus: NOT DETECTED
Bordetella pertussis: NOT DETECTED
CHLAMYDOPHILA PNEUMONIAE-RVPPCR: NOT DETECTED
Coronavirus 229E: NOT DETECTED
Coronavirus HKU1: NOT DETECTED
Coronavirus NL63: NOT DETECTED
Coronavirus OC43: NOT DETECTED
INFLUENZA A-RVPPCR: NOT DETECTED
Influenza B: NOT DETECTED
Metapneumovirus: NOT DETECTED
Mycoplasma pneumoniae: NOT DETECTED
PARAINFLUENZA VIRUS 3-RVPPCR: NOT DETECTED
Parainfluenza Virus 1: NOT DETECTED
Parainfluenza Virus 2: NOT DETECTED
Parainfluenza Virus 4: NOT DETECTED
RHINOVIRUS / ENTEROVIRUS - RVPPCR: NOT DETECTED
Respiratory Syncytial Virus: NOT DETECTED

## 2018-03-15 LAB — TSH: TSH: 0.311 u[IU]/mL — ABNORMAL LOW (ref 0.350–4.500)

## 2018-03-15 LAB — CBC
HCT: 40.4 % (ref 36.0–46.0)
Hemoglobin: 12.7 g/dL (ref 12.0–15.0)
MCH: 26.3 pg (ref 26.0–34.0)
MCHC: 31.4 g/dL (ref 30.0–36.0)
MCV: 83.6 fL (ref 80.0–100.0)
Platelets: 306 10*3/uL (ref 150–400)
RBC: 4.83 MIL/uL (ref 3.87–5.11)
RDW: 13.2 % (ref 11.5–15.5)
WBC: 8.7 10*3/uL (ref 4.0–10.5)
nRBC: 0 % (ref 0.0–0.2)

## 2018-03-15 LAB — HIV ANTIBODY (ROUTINE TESTING W REFLEX): HIV Screen 4th Generation wRfx: NONREACTIVE

## 2018-03-15 LAB — BASIC METABOLIC PANEL
ANION GAP: 11 (ref 5–15)
BUN: 9 mg/dL (ref 6–20)
CO2: 18 mmol/L — ABNORMAL LOW (ref 22–32)
Calcium: 8.6 mg/dL — ABNORMAL LOW (ref 8.9–10.3)
Chloride: 108 mmol/L (ref 98–111)
Creatinine, Ser: 0.92 mg/dL (ref 0.44–1.00)
GFR calc non Af Amer: >60 mL/min (ref >60)
Glucose, Bld: 220 mg/dL — ABNORMAL HIGH (ref 70–99)
Potassium: 4.1 mmol/L (ref 3.5–5.1)
Sodium: 137 mmol/L (ref 135–145)

## 2018-03-15 LAB — PHOSPHORUS: Phosphorus: 2.4 mg/dL — ABNORMAL LOW (ref 2.5–4.6)

## 2018-03-15 LAB — MAGNESIUM: Magnesium: 2.4 mg/dL (ref 1.7–2.4)

## 2018-03-15 MED ORDER — ALBUTEROL SULFATE HFA 108 (90 BASE) MCG/ACT IN AERS: 1.0000 | INHALATION_SPRAY | Freq: Four times a day (QID) | RESPIRATORY_TRACT | 0 refills | Status: DC | PRN

## 2018-03-15 MED ORDER — PREDNISONE 20 MG PO TABS: 40.0000 mg | ORAL_TABLET | Freq: Every day | ORAL | 0 refills | Status: AC

## 2018-03-15 MED ORDER — IPRATROPIUM BROMIDE 0.02 % IN SOLN
0.5000 mg | Freq: Four times a day (QID) | RESPIRATORY_TRACT | Status: DC
  Administered 2018-03-15: 0.5 mg via RESPIRATORY_TRACT
  Filled 2018-03-15: qty 2.5

## 2018-03-15 MED ORDER — PREDNISONE 20 MG PO TABS: 40.0000 mg | ORAL_TABLET | Freq: Every day | ORAL | 0 refills | Status: DC

## 2018-03-15 MED ORDER — LEVALBUTEROL HCL 0.63 MG/3ML IN NEBU
0.6300 mg | INHALATION_SOLUTION | Freq: Four times a day (QID) | RESPIRATORY_TRACT | Status: DC
  Filled 2018-03-15: qty 3

## 2018-03-15 MED ORDER — POTASSIUM CHLORIDE CRYS ER 20 MEQ PO TBCR: 20.0000 meq | EXTENDED_RELEASE_TABLET | Freq: Every day | ORAL | 0 refills | Status: DC

## 2018-03-15 NOTE — Progress Notes (Signed)
SATURATION QUALIFICATIONS:  Patient Saturations on Room Air at Rest = 96%  Patient Saturations on Room Air while Ambulating = 95%  Patient Saturations on 0 Liters of oxygen while Ambulating = 93%  Please briefly explain why patient needs home oxygen: n/a

## 2018-03-15 NOTE — Progress Notes (Signed)
Received call from central telemetry that patient's heart rate is 167. Patient up in the bathroom washing up and changing gowns. RN checked on patient who stated she was "just fine." Patient just received breathing tx/IV steroid. Will inform MD during rounds and continue to monitor.

## 2018-03-15 NOTE — Progress Notes (Signed)
Influenza PCR/Respiratory Panel negative. Droplet precautions discontinued. Patient informed.

## 2018-03-15 NOTE — Progress Notes (Signed)
Patient able to afford albuterol inhaler-goodrx discount coupon provided-patient voiced understanding.

## 2018-03-15 NOTE — Discharge Summary (Signed)
Discharge Summary  Roberta Guerrero ZOX:[id number] DOB: 1996/04/03  PCP: Merlyn Albert, MD  Admit date: 03/14/2018 Discharge date: 03/15/2018  Time spent: ,   Recommendations for Outpatient Follow-up:  1. F/u with PCP within a week  for hospital discharge follow up, repeat cbc/bmp at follow up  Discharge Diagnoses:  Active Hospital Problems   Diagnosis Date Noted  . Asthma 03/14/2018  . Asthma, chronic, unspecified asthma severity, with acute exacerbation 03/14/2018  . Essential hypertension, benign 03/23/2014    Resolved Hospital Problems  No resolved problems to display.    Discharge Condition: stable  Diet recommendation: regular diet  Filed Weights   03/14/18 1325  Weight: 129.7 kg    History of present illness: (per admitting MD Dr Adela Glimpse)  HPI: Roberta Guerrero is a 22 y.o. female with medical history significant of asthma, HTN, headache    Presented with a 2 day  history of cough no Chest pain some wheezing, myalgia shortness of breath He denied any fever attempted to use albuterol inhaler at home but did not seem to help she have had a viral type of illness for the past few days.  She only needs to use inhaler 2-3 a month She only needed steroids twice in her life.  She moved her Grandmother out of her house this weekend and had significant exposure to dust.  No sick contacts at home   History of asthma in the past did not require intubation   While in ER: Initially tachypneic up to 28 but breathing 100% on room air in noted increased work of breathing patient was given a dose of magnesium albuterol nebulizer was administered and a dose of IV Solu-Medrol Noted to have sodium 2.9 and was repleted Flu testing was negative Chest x-ray showed no evidence of pneumonia   Hospital Course:  Active Problems:   Essential hypertension, benign   Asthma   Asthma, chronic, unspecified asthma severity, with acute exacerbation  Asthma  exacerbation Respiratory viral panel negative, strep throat pcr negative, cxr no acute findings -she feels much better with steroids and albuterol' -she wants to go home, she is discharged on prednsion and albuterol, f/u with pcp   WJX:BJYNWG on home meds HCTZ  Hypokalemia: replaced  Morbid obesity Body mass index is 47.59 kg/m.  Procedures:  none  Consultations:  none  Discharge Exam: BP (!) 153/102 (BP Location: Left Arm)   Pulse 91   Temp 97.8 F (36.6 C) (Oral)   Resp 18   Ht 5\' 5"  (1.651 m)   Wt 129.7 kg   LMP 03/11/2018   SpO2 98%   BMI 47.59 kg/m   General: NAD Cardiovascular: RRR Respiratory: mild scattered wheezing at basis, otherwise good air movement   Discharge Instructions You were cared for by a hospitalist during your hospital stay. If you have any questions about your discharge medications or the care you received while you were in the hospital after you are discharged, you can call the unit and asked to speak with the hospitalist on call if the hospitalist that took care of you is not available. Once you are discharged, your primary care physician will handle any further medical issues. Please note that NO REFILLS for any discharge medications will be authorized once you are discharged, as it is imperative that you return to your primary care physician (or establish a relationship with a primary care physician if you do not have one) for your aftercare needs so that they can reassess your need  for medications and monitor your lab values.  Discharge Instructions    Diet general   Complete by:  As directed    Increase activity slowly   Complete by:  As directed      Allergies as of 03/15/2018      Reactions   Pineapple Other (See Comments)   Makes tongue swell   Corn-containing Products Rash   Other Rash   Nuts      Medication List    STOP taking these medications   metroNIDAZOLE 500 MG tablet Commonly known as:  FLAGYL     TAKE these  medications   albuterol 108 (90 Base) MCG/ACT inhaler Commonly known as:  PROVENTIL HFA;VENTOLIN HFA Inhale 1-2 puffs into the lungs every 6 (six) hours as needed for wheezing or shortness of breath.   EPINEPHrine 0.3 mg/0.3 mL Soaj injection Commonly known as:  EPI-PEN Inject 0.3 mg into the muscle once.   hydrochlorothiazide 12.5 MG capsule Commonly known as:  MICROZIDE Take 1 capsule (12.5 mg total) by mouth daily.   ibuprofen 200 MG tablet Commonly known as:  ADVIL,MOTRIN Take 800 mg by mouth every 6 (six) hours as needed for mild pain.   predniSONE 20 MG tablet Commonly known as:  DELTASONE Take 2 tablets (40 mg total) by mouth daily with breakfast for 5 days. Start taking on:  03/16/2018      Allergies  Allergen Reactions  . Pineapple Other (See Comments)    Makes tongue swell  . Corn-Containing Products Rash  . Other Rash    Nuts   Follow-up Information    [facility] Follow up.   Why:  hospital discharge follow up Contact information: [address] [place] [address] [phone]       Merlyn Albert, MD Follow up in 1 week(s).   Specialty:  Family Medicine Why:  hospital discharge follow up for asthma Contact information: [address] [place] [address] [phone]            The results of significant diagnostics from this hospitalization (including imaging, microbiology, ancillary and laboratory) are listed below for reference.    Significant Diagnostic Studies: Dg Chest Portable 1 View  Result Date: 03/14/2018 CLINICAL DATA:  Persistent cough.  Shortness of breath. EXAM: PORTABLE CHEST 1 VIEW COMPARISON:  June 01, 2016 FINDINGS: The heart size and mediastinal contours are within normal limits. Both lungs are clear. The visualized skeletal structures are unremarkable. IMPRESSION: No active disease. Electronically Signed   By: Gerome Sam III M.D   On: 03/14/2018 14:24     Microbiology: Recent Results (from the past 240 hour(s))  Respiratory Panel by PCR     Status: None   Collection Time: 03/14/18  6:51 PM  Result Value Ref Range Status   Adenovirus NOT DETECTED NOT DETECTED Final   Coronavirus 229E NOT DETECTED NOT DETECTED Final   Coronavirus HKU1 NOT DETECTED NOT DETECTED Final   Coronavirus NL63 NOT DETECTED NOT DETECTED Final   Coronavirus OC43 NOT DETECTED NOT DETECTED Final   Metapneumovirus NOT DETECTED NOT DETECTED Final   Rhinovirus / Enterovirus NOT DETECTED NOT DETECTED Final   Influenza A NOT DETECTED NOT DETECTED Final   Influenza B NOT DETECTED NOT DETECTED Final   Parainfluenza Virus 1 NOT DETECTED NOT DETECTED Final   Parainfluenza Virus 2 NOT DETECTED NOT DETECTED Final   Parainfluenza Virus 3 NOT DETECTED NOT DETECTED Final   Parainfluenza Virus 4 NOT DETECTED  NOT DETECTED Final   Respiratory Syncytial Virus NOT DETECTED NOT DETECTED Final   Bordetella pertussis NOT DETECTED NOT DETECTED Final   Chlamydophila pneumoniae NOT DETECTED NOT DETECTED Final   Mycoplasma pneumoniae NOT DETECTED NOT DETECTED Final    Comment: Performed at [facility], [address] [place], [place] [address]  Group A Strep by PCR     Status: None   Collection Time: 03/14/18  9:49 PM  Result Value Ref Range Status   Group A Strep by PCR NOT DETECTED NOT DETECTED Final    Comment: Performed at [facility], [address] [place], [place] [address]     Labs: Basic Metabolic Panel: Recent Labs  Lab 03/14/18 1435 03/14/18 2317 03/15/18 0544  NA 137 137 138  K 2.9* 4.1 4.0  CL 103 108 108  CO2 26 18* 21*  GLUCOSE 113* 220* 147*  BUN 7 9 7   CREATININE 1.03* 0.92 0.68  CALCIUM 9.0 8.6* 8.8*  MG  --   --  2.4  PHOS  --   --  2.4*   Liver Function Tests: Recent Labs  Lab 03/15/18 0544  AST 22  ALT 22  ALKPHOS 69  BILITOT 0.5  PROT 7.8  ALBUMIN 3.9   No results for input(s): LIPASE, AMYLASE in the last 168  hours. No results for input(s): AMMONIA in the last 168 hours. CBC: Recent Labs  Lab 03/14/18 1435 03/15/18 0544  WBC 5.3 8.7  NEUTROABS 2.5  --   HGB 12.8 12.7  HCT 40.4 40.4  MCV 84.0 83.6  PLT 256 306   Cardiac Enzymes: No results for input(s): CKTOTAL, CKMB, CKMBINDEX, TROPONINI in the last 168 hours. BNP: BNP (last 3 results) No results for input(s): BNP in the last 8760 hours.  ProBNP (last 3 results) No results for input(s): PROBNP in the last 8760 hours.  CBG: No results for input(s): GLUCAP in the last 168 hours.     Signed:  Albertine GratesFang Marionette Meskill MD, PhD  [facility] 03/15/2018, 11:20 AM

## 2018-03-15 NOTE — Care Management Note (Signed)
Case Management Note  Patient Details  Name: Roslynn Amblealiyah D Carnegie MRN: [id number] Date of Birth: [birth year]  Subjective/Objective: Asthma. From home. [place] Has pcp,pharmacy. No health insurance-provided w/[place] resources for health insurance-patient states she will have health insurance in January 2020,community resources. No further CM needs.                   Action/Plan:d/c home.   Expected Discharge Date:  03/15/18               Expected Discharge Plan:  Home/Self Care  In-House Referral:     Discharge planning Services  CM Consult, [facility]  Post Acute Care Choice:    Choice offered to:     DME Arranged:    DME Agency:     HH Arranged:    HH Agency:     Status of Service:  Completed, signed off  If discussed at [employer] Meetings, dates discussed:    Additional Comments:  Lanier ClamMahabir, Celie Desrochers, RN 03/15/2018, 11:10 AM

## 2018-03-17 LAB — CULTURE, GROUP A STREP (THRC): Special Requests: NORMAL

## 2018-04-14 ENCOUNTER — Encounter (HOSPITAL_COMMUNITY): Payer: Self-pay | Admitting: Emergency Medicine

## 2018-04-14 ENCOUNTER — Other Ambulatory Visit: Payer: Self-pay

## 2018-04-14 ENCOUNTER — Emergency Department (HOSPITAL_COMMUNITY)
Admission: EM | Admit: 2018-04-14 | Discharge: 2018-04-14 | Disposition: A | Discharge status: Home / Self Care | Payer: Medicaid Other | Attending: Emergency Medicine | Admitting: Emergency Medicine

## 2018-04-14 DIAGNOSIS — J988 Other specified respiratory disorders: Secondary | ICD-10-CM

## 2018-04-14 DIAGNOSIS — J069 Acute upper respiratory infection, unspecified: Secondary | ICD-10-CM | POA: Insufficient documentation

## 2018-04-14 DIAGNOSIS — Z79899 Other long term (current) drug therapy: Secondary | ICD-10-CM | POA: Insufficient documentation

## 2018-04-14 DIAGNOSIS — I10 Essential (primary) hypertension: Secondary | ICD-10-CM | POA: Insufficient documentation

## 2018-04-14 DIAGNOSIS — B9789 Other viral agents as the cause of diseases classified elsewhere: Secondary | ICD-10-CM

## 2018-04-14 DIAGNOSIS — J45909 Unspecified asthma, uncomplicated: Secondary | ICD-10-CM | POA: Insufficient documentation

## 2018-04-14 LAB — GROUP A STREP BY PCR: Group A Strep by PCR: NOT DETECTED

## 2018-04-14 NOTE — ED Triage Notes (Signed)
Pt c/o of sore throat and chills

## 2018-04-14 NOTE — ED Provider Notes (Addendum)
[facility] EMERGENCY DEPARTMENT Provider Note   CSN: [id number] Arrival date & time: 04/14/18  1000     History   Chief Complaint Chief Complaint  Patient presents with  . Sore Throat    HPI Roberta Guerrero is a 23 y.o. female.  HPI Of sore throat with pain on swallowing onset 1 month ago.  She also complains of cough.  She denies any shortness of breath.  Denies fever.  No other associated symptoms.  Treated with Motrin with partial relief.  Worse with swallowing not improved by anything.  Pain mild at present. Past Medical History:  Diagnosis Date  . Allergic rhinitis   . Asthma   . Headache   . Hypertension   . Menstrual migraine     Patient Active Problem List   Diagnosis Date Noted  . Asthma 03/14/2018  . Asthma, chronic, unspecified asthma severity, with acute exacerbation 03/14/2018  . Chlamydia 09/09/2017  . Chronic hypertension 09/03/2017  . Nexplanon in place 09/03/2017  . Screen for STD (sexually transmitted disease) 09/03/2017  . Family planning 09/03/2017  . Encounter for gynecological examination with Papanicolaou smear of cervix 09/03/2017  . Nexplanon insertion 07/18/2014  . Essential hypertension, benign 03/23/2014  . Food allergy 07/24/2013  . Obesity, morbid (HCC) 06/14/2013  . 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 Gastritis 02/26/2013  . Eczematous dermatitis of eyelid 09/26/2012  . Eczema 09/26/2012  . Asthma, chronic 08/09/2012  . Allergic rhinitis 08/09/2012  . Rash and nonspecific skin eruption 08/09/2012    Past Surgical History:  Procedure Laterality Date  . CHOLECYSTECTOMY    . MOUTH SURGERY    . SKIN BIOPSY       OB History    Gravida  0   Para  0   Term  0   Preterm  0   AB  0   Living  0     SAB  0   TAB  0   Ectopic  0   Multiple  0   Live Births  0            Home Medications    Prior to Admission medications   Medication Sig Start Date End Date Taking? Authorizing Provider  albuterol (PROVENTIL  HFA;VENTOLIN HFA) 108 (90 Base) MCG/ACT inhaler Inhale 1-2 puffs into the lungs every 6 (six) hours as needed for wheezing or shortness of breath. 03/15/18  Yes Albertine GratesXu, Fang, MD  EPINEPHrine 0.3 mg/0.3 mL IJ SOAJ injection Inject 0.3 mg into the muscle once.   Yes [provider]  hydrochlorothiazide (MICROZIDE) 12.5 MG capsule Take 1 capsule (12.5 mg total) by mouth daily. 09/03/17  Yes Cyril MourningGriffin, Jennifer A, NP  ibuprofen (ADVIL,MOTRIN) 200 MG tablet Take 800 mg by mouth every 6 (six) hours as needed for mild pain.   Yes [provider]  potassium chloride SA (K-DUR,KLOR-CON) 20 MEQ tablet Take 1 tablet (20 mEq total) by mouth daily. Patient not taking: Reported on 04/14/2018 03/15/18   Albertine GratesXu, Fang, MD    Family History Family History  Problem Relation Age of Onset  . Hypertension Mother   . Hypertension Father   . Hyperlipidemia Father   . Hypertension Maternal Grandmother   . Alcohol abuse Maternal Grandfather     Social History Social History   Tobacco Use  . Smoking status: Never Smoker  . Smokeless tobacco: Never Used  Substance Use Topics  . Alcohol use: Yes    Comment: occasional  . Drug use: No  Allergies   Pineapple; Corn-containing products; and Other   Review of Systems Review of Systems  Constitutional: Negative.   HENT: Positive for sore throat.   Respiratory: Positive for cough. Negative for shortness of breath.   Cardiovascular: Negative.   Gastrointestinal: Negative.   Musculoskeletal: Negative.   Skin: Negative.   Neurological: Negative.   Psychiatric/Behavioral: Negative.   All other systems reviewed and are negative.    Physical Exam Updated Vital Signs BP (!) 157/78 (BP Location: Right Arm)   Pulse 85   Temp 98.3 F (36.8 C) (Oral)   Resp 17   Ht 5\' 5"  (1.651 m)   Wt 123.8 kg   SpO2 96%   BMI 45.43 kg/m   Physical Exam Vitals signs and nursing note reviewed.  Constitutional:      Appearance: She is well-developed. She is  obese. She is not ill-appearing.  HENT:     Head: Normocephalic and atraumatic.     Right Ear: Tympanic membrane and ear canal normal.     Left Ear: Tympanic membrane and ear canal normal.     Nose: No congestion.     Comments: Tonsils red and swollen.  Uvula midline.    Mouth/Throat:     Pharynx: Uvula midline. Pharyngeal swelling and posterior oropharyngeal erythema present.     Tonsils: No tonsillar abscesses.  Eyes:     Conjunctiva/sclera: Conjunctivae normal.     Pupils: Pupils are equal, round, and reactive to light.  Neck:     Musculoskeletal: Neck supple.     Thyroid: No thyromegaly.     Trachea: No tracheal deviation.  Cardiovascular:     Rate and Rhythm: Normal rate and regular rhythm.     Heart sounds: No murmur.  Pulmonary:     Effort: Pulmonary effort is normal.     Breath sounds: Normal breath sounds.  Abdominal:     General: Bowel sounds are normal. There is no distension.     Palpations: Abdomen is soft.     Tenderness: There is no abdominal tenderness.     Comments: Obese  Musculoskeletal: Normal range of motion.        General: No tenderness.  Skin:    General: Skin is warm and dry.     Findings: No rash.  Neurological:     Mental Status: She is alert.     Coordination: Coordination normal.      ED Treatments / Results  Labs (all labs ordered are listed, but only abnormal results are displayed) Labs Reviewed  GROUP A STREP BY PCR    EKG None  Radiology No results found.  Procedures Procedures (including critical care time)  Medications Ordered in ED Medications - No data to display Results for orders placed or performed during the hospital encounter of 04/14/18  Group A Strep by PCR  Result Value Ref Range   Group A Strep by PCR NOT DETECTED NOT DETECTED   No results found.  Initial Impression / Assessment and Plan / ED Course  I have reviewed the triage vital signs and the nursing notes.  Pertinent labs & imaging results that were  available during my care of the patient were reviewed by me and considered in my medical decision making (see chart for details).     1115 resting comfortably .  Declines pain medicine. Symptoms consistent with viral respiratory illness.  Plan Tylenol or Advil for discomfort.  Repeat blood pressure 3 weeks Final Clinical Impressions(s) / ED Diagnoses  Diagnosis #1 viral  respiratory illness #2 elevated blood pressure Final diagnoses:  None    ED Discharge Orders    None       Doug Sou, MD 04/14/18 1122    Doug Sou, MD 04/14/18 1122

## 2018-04-14 NOTE — Discharge Instructions (Addendum)
Take Tylenol or Advil as directed for pain.  Get your blood pressure rechecked at your doctor's office within the next 3 weeks.  Today's was mildly elevated at 142/86.  You should also see your doctor if you are not feeling better in the next 7 to 10 days.

## 2018-04-26 ENCOUNTER — Telehealth: Payer: Self-pay | Admitting: *Deleted

## 2018-04-26 MED ORDER — METRONIDAZOLE 500 MG PO TABS: 500.0000 mg | ORAL_TABLET | Freq: Two times a day (BID) | ORAL | 0 refills | Status: DC

## 2018-04-26 NOTE — Telephone Encounter (Signed)
Pt requesting flagyl refill, has vaginal odor like BV, will Rx flagyl

## 2018-04-26 NOTE — Telephone Encounter (Signed)
Left message I returned her call  

## 2018-04-26 NOTE — Addendum Note (Signed)
Addended by: Cyril Mourning A on: 04/26/2018 11:40 AM   Modules accepted: Orders

## 2018-07-18 ENCOUNTER — Other Ambulatory Visit: Payer: Self-pay | Admitting: Adult Health

## 2018-07-18 MED ORDER — METRONIDAZOLE 500 MG PO TABS: 500.0000 mg | ORAL_TABLET | Freq: Two times a day (BID) | ORAL | 1 refills | Status: DC

## 2018-07-18 NOTE — Progress Notes (Signed)
Rx flagyl per her request

## 2018-09-21 ENCOUNTER — Other Ambulatory Visit: Payer: Self-pay | Admitting: Adult Health

## 2018-09-21 MED ORDER — METRONIDAZOLE 500 MG PO TABS: 500.0000 mg | ORAL_TABLET | Freq: Two times a day (BID) | ORAL | 1 refills | Status: DC

## 2018-09-21 NOTE — Progress Notes (Signed)
Refill flagyl  

## 2018-10-28 ENCOUNTER — Other Ambulatory Visit: Payer: Self-pay

## 2018-10-28 ENCOUNTER — Encounter (HOSPITAL_COMMUNITY): Payer: Self-pay

## 2018-10-28 ENCOUNTER — Emergency Department (HOSPITAL_COMMUNITY)
Admission: EM | Admit: 2018-10-28 | Discharge: 2018-10-28 | Disposition: A | Discharge status: Home / Self Care | Payer: Medicaid Other | Attending: Emergency Medicine | Admitting: Emergency Medicine

## 2018-10-28 DIAGNOSIS — Z79899 Other long term (current) drug therapy: Secondary | ICD-10-CM | POA: Insufficient documentation

## 2018-10-28 DIAGNOSIS — E860 Dehydration: Secondary | ICD-10-CM | POA: Insufficient documentation

## 2018-10-28 DIAGNOSIS — J45909 Unspecified asthma, uncomplicated: Secondary | ICD-10-CM | POA: Insufficient documentation

## 2018-10-28 DIAGNOSIS — I10 Essential (primary) hypertension: Secondary | ICD-10-CM | POA: Insufficient documentation

## 2018-10-28 LAB — URINALYSIS, ROUTINE W REFLEX MICROSCOPIC
Bilirubin Urine: NEGATIVE
Glucose, UA: NEGATIVE mg/dL
Hgb urine dipstick: NEGATIVE
Ketones, ur: 20 mg/dL — AB
Leukocytes,Ua: NEGATIVE
Nitrite: NEGATIVE
Protein, ur: NEGATIVE mg/dL
Specific Gravity, Urine: 1.027 (ref 1.005–1.030)
pH: 5 (ref 5.0–8.0)

## 2018-10-28 LAB — BASIC METABOLIC PANEL
Anion gap: 9 (ref 5–15)
BUN: 9 mg/dL (ref 6–20)
CO2: 22 mmol/L (ref 22–32)
Calcium: 8.7 mg/dL — ABNORMAL LOW (ref 8.9–10.3)
Chloride: 106 mmol/L (ref 98–111)
Creatinine, Ser: 0.77 mg/dL (ref 0.44–1.00)
GFR calc Af Amer: >60 mL/min (ref >60)
GFR calc non Af Amer: >60 mL/min (ref >60)
Glucose, Bld: 85 mg/dL (ref 70–99)
Potassium: 3.5 mmol/L (ref 3.5–5.1)
Sodium: 137 mmol/L (ref 135–145)

## 2018-10-28 LAB — CBC
HCT: 39.9 % (ref 36.0–46.0)
Hemoglobin: 12.5 g/dL (ref 12.0–15.0)
MCH: 27 pg (ref 26.0–34.0)
MCHC: 31.3 g/dL (ref 30.0–36.0)
MCV: 86.2 fL (ref 80.0–100.0)
Platelets: 263 10*3/uL (ref 150–400)
RBC: 4.63 MIL/uL (ref 3.87–5.11)
RDW: 13.6 % (ref 11.5–15.5)
WBC: 5.6 10*3/uL (ref 4.0–10.5)
nRBC: 0 % (ref 0.0–0.2)

## 2018-10-28 LAB — PREGNANCY, URINE: Preg Test, Ur: NEGATIVE

## 2018-10-28 MED ORDER — HYDROCHLOROTHIAZIDE 25 MG PO TABS: 25.0000 mg | ORAL_TABLET | Freq: Every day | ORAL | 3 refills | Status: DC

## 2018-10-28 MED ORDER — ALBUTEROL SULFATE HFA 108 (90 BASE) MCG/ACT IN AERS: 2.0000 | INHALATION_SPRAY | RESPIRATORY_TRACT | 1 refills | Status: DC | PRN

## 2018-10-28 MED ORDER — HYDROCHLOROTHIAZIDE 25 MG PO TABS
25.0000 mg | ORAL_TABLET | Freq: Every day | ORAL | Status: DC
  Administered 2018-10-28: 25 mg via ORAL
  Filled 2018-10-28: qty 1

## 2018-10-28 NOTE — ED Notes (Signed)
Pt has a history of HTN and does not take BP medicine

## 2018-10-28 NOTE — ED Provider Notes (Signed)
[facility]GENCY DEPARTMENT Provider Note   CSN: [id number] Arrival date & time: 10/28/18  [id number]     History   Chief Complaint Chief Complaint  Patient presents with  . Headache    HPI Roberta Guerrero is a 23 y.o. female.     HPI  The patient is a 23 year old female, she has a known history of hypertension as well as a history of headaches in the past.  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  She presents today during her usual cycle with a complaint of headache that is behind her eyes and around the frontal region as well as a feeling of lightheadedness when she tried to stand up this morning.  She reports that yesterday while she was at work she was feeling a bit lightheaded, she was working outside, she tried to drink lots of water but also reports that she only eats 1 meal per day and that is usual for her.  This morning at 5:00 AM she was able to get up and drive her boyfriend to work, she had no symptoms at that time.  She went back to bed and when she tried to get up again when she sat on the side of the bed and then tried to stand she became lightheaded, saw spots, went back onto the bed without passing out until her symptoms went away very shortly thereafter.  She does have an ongoing headache.  She states that she is thirsty.  She denies any fevers, chills, nausea, vomiting, diarrhea, rash, insect or tick bites, chest pain, cough, shortness of breath or any other symptoms.  She states that she has not been taking her blood pressure medications and cites the lack of insurance for this reason.  Past Medical History:  Diagnosis Date  . Allergic rhinitis   . Asthma   . Headache   . Hypertension   . Menstrual migraine     Patient Active Problem List   Diagnosis Date Noted  . Asthma 03/14/2018  . Asthma, chronic, unspecified asthma severity, with acute exacerbation 03/14/2018  . Chlamydia 09/09/2017  . Chronic hypertension 09/03/2017  . Nexplanon  in place 09/03/2017  . Screen for STD (sexually transmitted disease) 09/03/2017  . Family planning 09/03/2017  . Encounter for gynecological examination with Papanicolaou smear of cervix 09/03/2017  . Nexplanon insertion 07/18/2014  . Essential hypertension, benign 03/23/2014  . Food allergy 07/24/2013  . Obesity, morbid (HCC) 06/14/2013  . 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 Gastritis 02/26/2013  . Eczematous dermatitis of eyelid 09/26/2012  . Eczema 09/26/2012  . Asthma, chronic 08/09/2012  . Allergic rhinitis 08/09/2012  . Rash and nonspecific skin eruption 08/09/2012    Past Surgical History:  Procedure Laterality Date  . CHOLECYSTECTOMY    . MOUTH SURGERY    . SKIN BIOPSY       OB History    Gravida  0   Para  0   Term  0   Preterm  0   AB  0   Living  0     SAB  0   TAB  0   Ectopic  0   Multiple  0   Live Births  0            Home Medications    Prior to Admission medications   Medication Sig Start Date End Date Taking? Authorizing Provider  albuterol (PROVENTIL HFA;VENTOLIN HFA) 108 (90 Base) MCG/ACT inhaler Inhale 1-2 puffs into the lungs every  6 (six) hours as needed for wheezing or shortness of breath. 03/15/18   Albertine GratesXu, Fang, MD  EPINEPHrine 0.3 mg/0.3 mL IJ SOAJ injection Inject 0.3 mg into the muscle once.    [provider]  hydrochlorothiazide (MICROZIDE) 12.5 MG capsule Take 1 capsule (12.5 mg total) by mouth daily. 09/03/17   Adline PotterGriffin, Jennifer A, NP  ibuprofen (ADVIL,MOTRIN) 200 MG tablet Take 800 mg by mouth every 6 (six) hours as needed for mild pain.    [provider]  metroNIDAZOLE (FLAGYL) 500 MG tablet Take 1 tablet (500 mg total) by mouth 2 (two) times daily. 09/21/18   Adline PotterGriffin, Jennifer A, NP  potassium chloride SA (K-DUR,KLOR-CON) 20 MEQ tablet Take 1 tablet (20 mEq total) by mouth daily. Patient not taking: Reported on 04/14/2018 03/15/18   Albertine GratesXu, Fang, MD    Family History Family History  Problem Relation Age of  Onset  . Hypertension Mother   . Hypertension Father   . Hyperlipidemia Father   . Hypertension Maternal Grandmother   . Alcohol abuse Maternal Grandfather     Social History Social History   Tobacco Use  . Smoking status: Never Smoker  . Smokeless tobacco: Never Used  Substance Use Topics  . Alcohol use: Yes    Comment: occasional  . Drug use: No     Allergies   Pineapple, Corn-containing products, and Other   Review of Systems Review of Systems  All other systems reviewed and are negative.    Physical Exam Updated Vital Signs BP (!) 175/110 (BP Location: Right Arm)   Pulse (!) 57   Temp 97.7 F (36.5 C) (Oral)   Resp (!) 21   Ht 1.651 m (5\' 5" )   Wt 109.8 kg   LMP 10/22/2018   SpO2 100%   BMI 40.27 kg/m   Physical Exam Vitals signs and nursing note reviewed.  Constitutional:      General: She is not in acute distress.    Appearance: She is well-developed.  HENT:     Head: Normocephalic and atraumatic.     Mouth/Throat:     Pharynx: No oropharyngeal exudate.  Eyes:     General: No scleral icterus.       Right eye: No discharge.        Left eye: No discharge.     Conjunctiva/sclera: Conjunctivae normal.     Pupils: Pupils are equal, round, and reactive to light.  Neck:     Musculoskeletal: Normal range of motion and neck supple.     Thyroid: No thyromegaly.     Vascular: No JVD.  Cardiovascular:     Rate and Rhythm: Normal rate and regular rhythm.     Heart sounds: Normal heart sounds. No murmur. No friction rub. No gallop.   Pulmonary:     Effort: Pulmonary effort is normal. No respiratory distress.     Breath sounds: Normal breath sounds. No wheezing or rales.  Abdominal:     General: Bowel sounds are normal. There is no distension.     Palpations: Abdomen is soft. There is no mass.     Tenderness: There is no abdominal tenderness.  Musculoskeletal: Normal range of motion.        General: No tenderness.  Lymphadenopathy:     Cervical: No  cervical adenopathy.  Skin:    General: Skin is warm and dry.     Findings: No erythema or rash.  Neurological:     Mental Status: She is alert.  Coordination: Coordination normal.     Comments: Speech is clear, cranial nerves III through XII are intact, memory is intact, strength is normal in all 4 extremities including grips, sensation is intact to light touch and pinprick in all 4 extremities. Coordination as tested by finger-nose-finger is normal, no limb ataxia. Normal gait, normal reflexes at the patellar tendons bilaterally  Psychiatric:        Behavior: Behavior normal.      ED Treatments / Results  Labs (all labs ordered are listed, but only abnormal results are displayed) Labs Reviewed - No data to display  EKG EKG Interpretation  Date/Time:  Friday October 28 2018 08:17:30 EDT Ventricular Rate:  55 PR Interval:    QRS Duration: 103 QT Interval:  404 QTC Calculation: 387 R Axis:   61 Text Interpretation:  Sinus rhythm Atrial premature complex Since last tracing rate slower otherwise no significant changes Confirmed by Noemi Chapel [phone]) on 10/28/2018 8:19:52 AM   Radiology No results found.  Procedures Procedures (including critical care time)  Medications Ordered in ED Medications - No data to display   Initial Impression / Assessment and Plan / ED Course  I have reviewed the triage vital signs and the nursing notes.  Pertinent labs & imaging results that were available during my care of the patient were reviewed by me and considered in my medical decision making (see chart for details).  Clinical Course as of Oct 28 1154  Fri Oct 28, 2018  1152 Laboratory work-up shows the patient has some ketones but no pregnancy, specific gravity is 1.027, no infection and both CBC and metabolic panel are normal.  Blood pressure is now 154/88.  She will be discharged home with hydrochlorothiazide prescription and instructions for close follow-up.  She is agreeable    [BM]    Clinical Course User Index [BM] Noemi Chapel, MD       The patient appears well, her headache is minimal and there is no focal neurologic symptoms going along with it.  She has essentially no shortness of breath or wheezing and her vital signs are unremarkable except for her blood pressure which is elevated.  She will need to be restarted on blood pressure medication but due to her uncontrolled hypertension I will check a basic metabolic panel first to ensure good renal function.  We will also rule out anemia as a cause of lightheadedness or weakness.  The patient will need to stay out of work today to stay home to hydrate but otherwise does not need any other other acute stabilizing interventions.  She is agreeable to the plan.  Final Clinical Impressions(s) / ED Diagnoses   Final diagnoses:  Dehydration  Essential hypertension    ED Discharge Orders         Ordered    hydrochlorothiazide (HYDRODIURIL) 25 MG tablet  Daily     10/28/18 1154    albuterol (VENTOLIN HFA) 108 (90 Base) MCG/ACT inhaler  Every 4 hours PRN     10/28/18 1154           Noemi Chapel, MD 10/28/18 1156

## 2018-10-28 NOTE — ED Triage Notes (Signed)
Pt has been having increasing SOB for the last few days as well as a headache. States she is very lethargic and can barely keep her eyes open. Is complaining of chest tightness as well. Has had a known COVID exposure.

## 2018-10-28 NOTE — Discharge Instructions (Signed)
Your testing today has been unremarkable, it shows that you were only mildly dehydrated but I would strongly recommend that you stay home and hydrate today.  I have given you a work excuse to stay out today.  Please start taking hydrochlorothiazide daily and have your family doctor recheck your blood pressure in 2 weeks.  Seek medical exam for any severe or worsening symptoms.

## 2018-10-28 NOTE — ED Notes (Signed)
Pt states she cant urinate at this time.  Given cup of water

## 2018-11-24 ENCOUNTER — Other Ambulatory Visit: Payer: Self-pay | Admitting: Internal Medicine

## 2018-11-24 DIAGNOSIS — Z20822 Contact with and (suspected) exposure to covid-19: Secondary | ICD-10-CM

## 2018-11-26 LAB — NOVEL CORONAVIRUS, NAA: SARS-CoV-2, NAA: NOT DETECTED

## 2019-02-02 ENCOUNTER — Other Ambulatory Visit: Payer: Self-pay

## 2019-02-02 DIAGNOSIS — Z20822 Contact with and (suspected) exposure to covid-19: Secondary | ICD-10-CM

## 2019-02-04 LAB — NOVEL CORONAVIRUS, NAA: SARS-CoV-2, NAA: NOT DETECTED

## 2019-03-03 ENCOUNTER — Ambulatory Visit
Admission: EM | Admit: 2019-03-03 | Discharge: 2019-03-03 | Disposition: A | Discharge status: Home / Self Care | Payer: Medicaid Other | Attending: Emergency Medicine | Admitting: Emergency Medicine

## 2019-03-03 ENCOUNTER — Other Ambulatory Visit: Payer: Self-pay

## 2019-03-03 DIAGNOSIS — J4521 Mild intermittent asthma with (acute) exacerbation: Secondary | ICD-10-CM

## 2019-03-03 DIAGNOSIS — G44209 Tension-type headache, unspecified, not intractable: Secondary | ICD-10-CM

## 2019-03-03 DIAGNOSIS — Z760 Encounter for issue of repeat prescription: Secondary | ICD-10-CM

## 2019-03-03 DIAGNOSIS — R030 Elevated blood-pressure reading, without diagnosis of hypertension: Secondary | ICD-10-CM

## 2019-03-03 MED ORDER — ALBUTEROL SULFATE HFA 108 (90 BASE) MCG/ACT IN AERS
2.0000 | INHALATION_SPRAY | Freq: Once | RESPIRATORY_TRACT | Status: AC
  Administered 2019-03-03: 17:00:00 2 via RESPIRATORY_TRACT

## 2019-03-03 NOTE — ED Provider Notes (Signed)
[facility] CARE CENTER   [id number] 03/03/19 Arrival Time: 1506  CC: Blood pressure; albuterol refill  SUBJECTIVE:  Roberta Guerrero is a 23 y.o. female who presents for elevated blood pressure.  Was checked by EMS this morning and was 220/110.  Hx of HTN for about 3 years. States blood pressure on average is 190/90-100.  Takes HCTZ, has been taking everyday for the past 2 week.  Does not have a PCP.  Reports associated headache localized to temples, describes as intermittent, and throbbing.  Reports radiating symptoms towards back of head.  Has not tried OTC medications.   Denies vision changes, dizziness, lightheadedness, chest pain, shortness of breath, numbness or tingling in extremities, facial droop, slurred speech, weakness, abdominal pain, changes in bowel or bladder habits.    Hx of asthma with recent wheezing at night for the past couple of months. Ran out of inhaler.     ROS: As per HPI.  All other pertinent ROS negative.     Past Medical History:  Diagnosis Date  . Allergic rhinitis   . Asthma   . Headache   . Hypertension   . Menstrual migraine    Past Surgical History:  Procedure Laterality Date  . CHOLECYSTECTOMY    . MOUTH SURGERY    . SKIN BIOPSY     Allergies  Allergen Reactions  . Pineapple Other (See Comments)    Makes tongue swell  . Corn-Containing Products Rash  . Other Rash    Nuts   No current facility-administered medications on file prior to encounter.    Current Outpatient Medications on File Prior to Encounter  Medication Sig Dispense Refill  . albuterol (VENTOLIN HFA) 108 (90 Base) MCG/ACT inhaler Inhale 2 puffs into the lungs every 4 (four) hours as needed for wheezing or shortness of breath. 6.7 g 1  . EPINEPHrine 0.3 mg/0.3 mL IJ SOAJ injection Inject 0.3 mg into the muscle once.    . hydrochlorothiazide (HYDRODIURIL) 25 MG tablet Take 1 tablet (25 mg total) by mouth daily. 30 tablet 3   Social History   Socioeconomic History  . Marital  status: Single    Spouse name: Not on file  . Number of children: Not on file  . Years of education: Not on file  . Highest education level: Not on file  Occupational History  . Not on file  Social Needs  . Financial resource strain: Not on file  . Food insecurity    Worry: Not on file    Inability: Not on file  . Transportation needs    Medical: Not on file    Non-medical: Not on file  Tobacco Use  . Smoking status: Never Smoker  . Smokeless tobacco: Never Used  Substance and Sexual Activity  . Alcohol use: Yes    Comment: occasional  . Drug use: No  . Sexual activity: Yes    Birth control/protection: None  Lifestyle  . Physical activity    Days per week: Not on file    Minutes per session: Not on file  . Stress: Not on file  Relationships  . Social Musician on phone: Not on file    Gets together: Not on file    Attends religious service: Not on file    Active member of club or organization: Not on file    Attends meetings of clubs or organizations: Not on file    Relationship status: Not on file  . Intimate partner violence  Fear of current or ex partner: Not on file    Emotionally abused: Not on file    Physically abused: Not on file    Forced sexual activity: Not on file  Other Topics Concern  . Not on file  Social History Narrative  . Not on file   Family History  Problem Relation Age of Onset  . Hypertension Mother   . Hypertension Father   . Hyperlipidemia Father   . Hypertension Maternal Grandmother   . Alcohol abuse Maternal Grandfather     OBJECTIVE:  Vitals:   03/03/19 1535  BP: (!) 141/98  Pulse: 88  Resp: 18  Temp: 98.8 F (37.1 C)  SpO2: 96%    General appearance: alert; no distress Eyes: PERRLA; EOMI HENT: normocephalic; atraumatic; oropharynx clear Neck: supple with FROM; no carotid bruits Lungs: clear to auscultation bilaterally Heart: regular rate and rhythm.  Radial pulses 2+ symmetrical bilaterally Abdomen:  soft, nondistended, normal active bowel sounds; nontender to palpation; no guarding  Extremities: no edema; symmetrical with no gross deformities Skin: warm and dry Neurologic: ambulates without difficulty Psychological: alert and cooperative; normal mood and affect   ASSESSMENT & PLAN:  1. Elevated blood pressure reading   2. Acute non intractable tension-type headache   3. Mild intermittent asthma with exacerbation   4. Medication refill     Meds ordered this encounter  Medications  . albuterol (VENTOLIN HFA) 108 (90 Base) MCG/ACT inhaler 2 puff   Unable to rule out HTN Emergency in urgent care setting.  Offered patient further evaluation and management in the ED.  Patient declines at this time and would like to try outpatient therapy first.  Aware of the risk associated with this decision including missed diagnosis, organ damage, organ failure, and/or death.  Patient aware and in agreement.     Blood work ordered Continue with HCTZ as prescribed.   Please continue to monitor blood pressure at home and keep a log Eat a well balanced diet of fruits, vegetables and lean meats.  Avoid foods high in fat and salt Drink water.  At least half your body weight in ounces Exercise for at least 30 minutes daily Use OTC tylenol as needed for headache Follow up with PCP ASAP to establish care and for further evaluation and management of elevated blood pressure Return, call 911, or go to the ED if you have any new or worsening symptoms such as vision changes, fatigue, dizziness, chest pain, shortness of breath, nausea, swelling in your hands or feet, urinary symptoms, etc...  Inhaler refilled in office  Reviewed expectations re: course of current medical issues. Questions answered. Outlined signs and symptoms indicating need for more acute intervention. Patient verbalized understanding. After Visit Summary given.   Lestine Box, PA-C 03/03/19 1651

## 2019-03-03 NOTE — ED Triage Notes (Signed)
Pt presents for c/o headache and dizziness, pt states that ems was called to her work ant her BP 220/110, pt declined transport and is feeling a little better

## 2019-03-03 NOTE — Discharge Instructions (Signed)
Unable to rule out HTN Emergency in urgent care setting.  Offered patient further evaluation and management in the ED.  Patient declines at this time and would like to try outpatient therapy first.  Aware of the risk associated with this decision including missed diagnosis, organ damage, organ failure, and/or death.  Patient aware and in agreement.     Blood work ordered Continue with HCTZ as prescribed.   Please continue to monitor blood pressure at home and keep a log Eat a well balanced diet of fruits, vegetables and lean meats.  Avoid foods high in fat and salt Drink water.  At least half your body weight in ounces Exercise for at least 30 minutes daily Use OTC tylenol as needed for headache Follow up with PCP ASAP to establish care and for further evaluation and management of elevated blood pressure Return, call 911, or go to the ED if you have any new or worsening symptoms such as vision changes, fatigue, dizziness, chest pain, shortness of breath, nausea, swelling in your hands or feet, urinary symptoms, etc...  Inhaler refilled in office

## 2019-03-03 NOTE — ED Triage Notes (Signed)
Charted on wrong patient

## 2019-03-04 LAB — BASIC METABOLIC PANEL
BUN/Creatinine Ratio: 13 (ref 9–23)
BUN: 11 mg/dL (ref 6–20)
CO2: 25 mmol/L (ref 20–29)
Calcium: 9.5 mg/dL (ref 8.7–10.2)
Chloride: 100 mmol/L (ref 96–106)
Creatinine, Ser: 0.86 mg/dL (ref 0.57–1.00)
GFR calc Af Amer: 110 mL/min/{1.73_m2} (ref >59)
GFR calc non Af Amer: 96 mL/min/{1.73_m2} (ref >59)
Glucose: 87 mg/dL (ref 65–99)
Potassium: 4.2 mmol/L (ref 3.5–5.2)
Sodium: 139 mmol/L (ref 134–144)

## 2019-05-10 ENCOUNTER — Encounter: Payer: Self-pay | Admitting: Family Medicine

## 2019-05-11 ENCOUNTER — Encounter: Payer: Self-pay | Admitting: Family Medicine

## 2019-06-12 ENCOUNTER — Emergency Department (HOSPITAL_COMMUNITY)
Admission: EM | Admit: 2019-06-12 | Discharge: 2019-06-12 | Disposition: A | Discharge status: Home / Self Care | Payer: Medicaid Other | Attending: Emergency Medicine | Admitting: Emergency Medicine

## 2019-06-12 ENCOUNTER — Other Ambulatory Visit: Payer: Self-pay

## 2019-06-12 DIAGNOSIS — R519 Headache, unspecified: Secondary | ICD-10-CM | POA: Insufficient documentation

## 2019-06-12 DIAGNOSIS — I10 Essential (primary) hypertension: Secondary | ICD-10-CM | POA: Insufficient documentation

## 2019-06-12 DIAGNOSIS — J45909 Unspecified asthma, uncomplicated: Secondary | ICD-10-CM | POA: Insufficient documentation

## 2019-06-12 DIAGNOSIS — Z79899 Other long term (current) drug therapy: Secondary | ICD-10-CM | POA: Insufficient documentation

## 2019-06-12 LAB — BASIC METABOLIC PANEL
Anion gap: 9 (ref 5–15)
BUN: 10 mg/dL (ref 6–20)
CO2: 23 mmol/L (ref 22–32)
Calcium: 8.5 mg/dL — ABNORMAL LOW (ref 8.9–10.3)
Chloride: 105 mmol/L (ref 98–111)
Creatinine, Ser: 0.77 mg/dL (ref 0.44–1.00)
GFR calc Af Amer: >60 mL/min (ref >60)
GFR calc non Af Amer: >60 mL/min (ref >60)
Glucose, Bld: 131 mg/dL — ABNORMAL HIGH (ref 70–99)
Potassium: 3.2 mmol/L — ABNORMAL LOW (ref 3.5–5.1)
Sodium: 137 mmol/L (ref 135–145)

## 2019-06-12 LAB — CBC
HCT: 40 % (ref 36.0–46.0)
Hemoglobin: 12.5 g/dL (ref 12.0–15.0)
MCH: 27.7 pg (ref 26.0–34.0)
MCHC: 31.3 g/dL (ref 30.0–36.0)
MCV: 88.7 fL (ref 80.0–100.0)
Platelets: 264 10*3/uL (ref 150–400)
RBC: 4.51 MIL/uL (ref 3.87–5.11)
RDW: 13.1 % (ref 11.5–15.5)
WBC: 6.9 10*3/uL (ref 4.0–10.5)
nRBC: 0 % (ref 0.0–0.2)

## 2019-06-12 MED ORDER — METOCLOPRAMIDE HCL 5 MG/ML IJ SOLN
10.0000 mg | Freq: Once | INTRAMUSCULAR | Status: AC
  Administered 2019-06-12: 10 mg via INTRAVENOUS
  Filled 2019-06-12: qty 2

## 2019-06-12 MED ORDER — SODIUM CHLORIDE 0.9 % IV BOLUS
1000.0000 mL | Freq: Once | INTRAVENOUS | Status: AC
  Administered 2019-06-12: 1000 mL via INTRAVENOUS

## 2019-06-12 MED ORDER — KETOROLAC TROMETHAMINE 30 MG/ML IJ SOLN
15.0000 mg | Freq: Once | INTRAMUSCULAR | Status: AC
  Administered 2019-06-12: 15 mg via INTRAVENOUS
  Filled 2019-06-12: qty 1

## 2019-06-12 MED ORDER — HYDROCHLOROTHIAZIDE 25 MG PO TABS: 25.0000 mg | ORAL_TABLET | Freq: Every day | ORAL | 3 refills | Status: DC

## 2019-06-12 MED ORDER — DIPHENHYDRAMINE HCL 50 MG/ML IJ SOLN
25.0000 mg | Freq: Once | INTRAMUSCULAR | Status: AC
  Administered 2019-06-12: 25 mg via INTRAVENOUS
  Filled 2019-06-12: qty 1

## 2019-06-12 NOTE — ED Triage Notes (Signed)
Pt with c/o headache x 2hrs. States she believes her BP is up and that she has numbness in fingers and toes. States she has been out of her BP meds.

## 2019-06-12 NOTE — ED Provider Notes (Signed)
[facility] EMERGENCY DEPARTMENT Provider Note   CSN: [id number] Arrival date & time: 06/12/19  0117     History Chief Complaint  Patient presents with  . Headache    Roberta Guerrero is a 24 y.o. female.  Patient presents to the emergency department for evaluation of headache.  Patient reports a throbbing headache in the bitemporal regions.  Patient reports that she has had similar headaches in the past when her blood pressure is elevated.  She has run out of her hydrochlorothiazide approximately a month ago.  She is not experiencing any vision changes.  She denies chest pain, heart palpitations, shortness of breath.  No recent illness, no fever.        Past Medical History:  Diagnosis Date  . Allergic rhinitis   . Asthma   . Headache   . Hypertension   . Menstrual migraine     Patient Active Problem List   Diagnosis Date Noted  . Asthma 03/14/2018  . Asthma, chronic, unspecified asthma severity, with acute exacerbation 03/14/2018  . Chlamydia 09/09/2017  . Chronic hypertension 09/03/2017  . Nexplanon in place 09/03/2017  . Screen for STD (sexually transmitted disease) 09/03/2017  . Family planning 09/03/2017  . Encounter for gynecological examination with Papanicolaou smear of cervix 09/03/2017  . Nexplanon insertion 07/18/2014  . Essential hypertension, benign 03/23/2014  . Food allergy 07/24/2013  . Obesity, morbid (HCC) 06/14/2013  . 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 Gastritis 02/26/2013  . Eczematous dermatitis of eyelid 09/26/2012  . Eczema 09/26/2012  . Asthma, chronic 08/09/2012  . Allergic rhinitis 08/09/2012  . Rash and nonspecific skin eruption 08/09/2012    Past Surgical History:  Procedure Laterality Date  . CHOLECYSTECTOMY    . MOUTH SURGERY    . SKIN BIOPSY       OB History    Gravida  0   Para  0   Term  0   Preterm  0   AB  0   Living  0     SAB  0   TAB  0   Ectopic  0   Multiple  0   Live Births  0            Family History  Problem Relation Age of Onset  . Hypertension Mother   . Hypertension Father   . Hyperlipidemia Father   . Hypertension Maternal Grandmother   . Alcohol abuse Maternal Grandfather     Social History   Tobacco Use  . Smoking status: Never Smoker  . Smokeless tobacco: Never Used  Substance Use Topics  . Alcohol use: Yes    Comment: occasional  . Drug use: No    Home Medications Prior to Admission medications   Medication Sig Start Date End Date Taking? Authorizing Provider  albuterol (VENTOLIN HFA) 108 (90 Base) MCG/ACT inhaler Inhale 2 puffs into the lungs every 4 (four) hours as needed for wheezing or shortness of breath. 10/28/18   Eber Hong, MD  EPINEPHrine 0.3 mg/0.3 mL IJ SOAJ injection Inject 0.3 mg into the muscle once.    [provider]  hydrochlorothiazide (HYDRODIURIL) 25 MG tablet Take 1 tablet (25 mg total) by mouth daily. 06/12/19   Gilda Crease, MD    Allergies    Pineapple, Corn-containing products, and Other  Review of Systems   Review of Systems  Neurological: Positive for headaches.  All other systems reviewed and are negative.   Physical Exam Updated Vital Signs BP (!) 155/100 (BP  Location: Right Arm)   Pulse (!) 118   Temp 97.8 F (36.6 C) (Oral)   Resp 18   Ht 5\' 5"  (1.651 m)   Wt 106.6 kg   LMP 06/10/2019   SpO2 100%   BMI 39.11 kg/m   Physical Exam Vitals and nursing note reviewed.  Constitutional:      General: She is not in acute distress.    Appearance: Normal appearance. She is well-developed.  HENT:     Head: Normocephalic and atraumatic.     Right Ear: Hearing normal.     Left Ear: Hearing normal.     Nose: Nose normal.  Eyes:     Conjunctiva/sclera: Conjunctivae normal.     Pupils: Pupils are equal, round, and reactive to light.  Cardiovascular:     Rate and Rhythm: Regular rhythm. Tachycardia present.     Heart sounds: S1 normal and S2 normal. No murmur. No friction rub. No  gallop.   Pulmonary:     Effort: Pulmonary effort is normal. No respiratory distress.     Breath sounds: Normal breath sounds.  Chest:     Chest wall: No tenderness.  Abdominal:     General: Bowel sounds are normal.     Palpations: Abdomen is soft.     Tenderness: There is no abdominal tenderness. There is no guarding or rebound. Negative signs include Murphy's sign and McBurney's sign.     Hernia: No hernia is present.  Musculoskeletal:        General: Normal range of motion.     Cervical back: Normal range of motion and neck supple.  Skin:    General: Skin is warm and dry.     Findings: No rash.  Neurological:     Mental Status: She is alert and oriented to person, place, and time.     GCS: GCS eye subscore is 4. GCS verbal subscore is 5. GCS motor subscore is 6.     Cranial Nerves: No cranial nerve deficit.     Sensory: No sensory deficit.     Coordination: Coordination normal.  Psychiatric:        Speech: Speech normal.        Behavior: Behavior normal.        Thought Content: Thought content normal.     ED Results / Procedures / Treatments   Labs (all labs ordered are listed, but only abnormal results are displayed) Labs Reviewed  BASIC METABOLIC PANEL - Abnormal; Notable for the following components:      Result Value   Potassium 3.2 (*)    Glucose, Bld 131 (*)    Calcium 8.5 (*)    All other components within normal limits  CBC    EKG None  Radiology No results found.  Procedures Procedures (including critical care time)  Medications Ordered in ED Medications  sodium chloride 0.9 % bolus 1,000 mL (1,000 mLs Intravenous New Bag/Given 06/12/19 0156)  ketorolac (TORADOL) 30 MG/ML injection 15 mg (15 mg Intravenous Given 06/12/19 0157)  metoCLOPramide (REGLAN) injection 10 mg (10 mg Intravenous Given 06/12/19 0156)  diphenhydrAMINE (BENADRYL) injection 25 mg (25 mg Intravenous Given 06/12/19 0156)    ED Course  I have reviewed the triage vital signs and the  nursing notes.  Pertinent labs & imaging results that were available during my care of the patient were reviewed by me and considered in my medical decision making (see chart for details).    MDM Rules/Calculators/A&P  Patient presents to the emergency department for evaluation of headache.  Patient reports that she has frequent headaches and often her headaches are secondary to her blood pressure being elevated.  She has a nonfocal, normal neurologic exam at arrival.  This is not an unusual headache for her.  No red flag symptoms.  She does not require neuroimaging.  Blood pressure is mildly elevated.  She admits that she has been off of her hydrochlorothiazide for 1 month.  Patient administered Toradol, Reglan, Benadryl and has had improvement of her headache.  Will reinitiate blood pressure medication, needs follow-up with PCP.  Final Clinical Impression(s) / ED Diagnoses Final diagnoses:  Bad headache  Essential hypertension    Rx / DC Orders ED Discharge Orders         Ordered    hydrochlorothiazide (HYDRODIURIL) 25 MG tablet  Daily     06/12/19 0319           Orpah Greek, MD 06/12/19 [phone]

## 2019-10-24 ENCOUNTER — Other Ambulatory Visit: Payer: Self-pay

## 2019-10-24 ENCOUNTER — Encounter: Payer: Self-pay | Admitting: Emergency Medicine

## 2019-10-24 ENCOUNTER — Ambulatory Visit
Admission: EM | Admit: 2019-10-24 | Discharge: 2019-10-24 | Disposition: A | Discharge status: Home / Self Care | Payer: Medicaid Other | Attending: Emergency Medicine | Admitting: Emergency Medicine

## 2019-10-24 DIAGNOSIS — J029 Acute pharyngitis, unspecified: Secondary | ICD-10-CM

## 2019-10-24 DIAGNOSIS — H65193 Other acute nonsuppurative otitis media, bilateral: Secondary | ICD-10-CM | POA: Insufficient documentation

## 2019-10-24 LAB — POCT RAPID STREP A (OFFICE): Rapid Strep A Screen: NEGATIVE

## 2019-10-24 MED ORDER — LIDOCAINE VISCOUS HCL 2 % MT SOLN: 15.0000 mL | OROMUCOSAL | 0 refills | Status: DC | PRN

## 2019-10-24 MED ORDER — MENTHOL 3 MG MT LOZG: 1.0000 | LOZENGE | OROMUCOSAL | 0 refills | Status: DC | PRN

## 2019-10-24 MED ORDER — FLUTICASONE PROPIONATE 50 MCG/ACT NA SUSP: 1.0000 | Freq: Every day | NASAL | 0 refills | Status: DC

## 2019-10-24 MED ORDER — CETIRIZINE HCL 10 MG PO TABS: 10.0000 mg | ORAL_TABLET | Freq: Every day | ORAL | 0 refills | Status: DC

## 2019-10-24 NOTE — ED Triage Notes (Signed)
Sore throat , neck and bilateral ear pain since this morning.

## 2019-10-24 NOTE — ED Provider Notes (Addendum)
[facility]   [id number] 10/24/19 Arrival Time: 1152  Chief Complaint  Patient presents with  . Sore Throat     SUBJECTIVE: History from: patient.  Roberta Guerrero is a 24 y.o. female who presented to the urgent care with a complaint of sore throat and bilateral ear pain that started this morning.  Denies exposure to strep, flu or mono, or precipitating event.  Has not tried any OTC medication.  Symptoms are made worse with swallowing, but tolerating liquids and own secretions without difficulty.  Denies previous symptoms in the past.  Denies fever, chills, fatigue, ear pain, sinus pain, rhinorrhea, nasal congestion, cough, SOB, wheezing, chest pain, nausea, rash, changes in bowel or bladder habits.    ROS: As per HPI.  All other pertinent ROS negative.      Past Medical History:  Diagnosis Date  . Allergic rhinitis   . Asthma   . Headache   . Hypertension   . Menstrual migraine    Past Surgical History:  Procedure Laterality Date  . CHOLECYSTECTOMY    . MOUTH SURGERY    . SKIN BIOPSY     Allergies  Allergen Reactions  . Pineapple Other (See Comments)    Makes tongue swell  . Corn-Containing Products Rash  . Other Rash    Nuts   No current facility-administered medications on file prior to encounter.   Current Outpatient Medications on File Prior to Encounter  Medication Sig Dispense Refill  . albuterol (VENTOLIN HFA) 108 (90 Base) MCG/ACT inhaler Inhale 2 puffs into the lungs every 4 (four) hours as needed for wheezing or shortness of breath. 6.7 g 1  . EPINEPHrine 0.3 mg/0.3 mL IJ SOAJ injection Inject 0.3 mg into the muscle once.    . hydrochlorothiazide (HYDRODIURIL) 25 MG tablet Take 1 tablet (25 mg total) by mouth daily. 30 tablet 3   Social History   Socioeconomic History  . Marital status: Single    Spouse name: Not on file  . Number of children: Not on file  . Years of education: Not on file  . Highest education level: Not on file    Occupational History  . Not on file  Tobacco Use  . Smoking status: Never Smoker  . Smokeless tobacco: Never Used  Vaping Use  . Vaping Use: Never used  Substance and Sexual Activity  . Alcohol use: Yes    Comment: occasional  . Drug use: No  . Sexual activity: Yes    Birth control/protection: None  Other Topics Concern  . Not on file  Social History Narrative  . Not on file   Social Determinants of Health   Financial Resource Strain:   . Difficulty of Paying Living Expenses:   Food Insecurity:   . Worried About Programme researcher, broadcasting/film/video in the Last Year:   . Barista in the Last Year:   Transportation Needs:   . Freight forwarder (Medical):   Marland Kitchen Lack of Transportation (Non-Medical):   Physical Activity:   . Days of Exercise per Week:   . Minutes of Exercise per Session:   Stress:   . Feeling of Stress :   Social Connections:   . Frequency of Communication with Friends and Family:   . Frequency of Social Gatherings with Friends and Family:   . Attends Religious Services:   . Active Member of Clubs or Organizations:   . Attends Banker Meetings:   Marland Kitchen Marital Status:   Intimate Programme researcher, broadcasting/film/video  Violence:   . Fear of Current or Ex-Partner:   . Emotionally Abused:   Marland Kitchen Physically Abused:   . Sexually Abused:    Family History  Problem Relation Age of Onset  . Hypertension Mother   . Hypertension Father   . Hyperlipidemia Father   . Hypertension Maternal Grandmother   . Alcohol abuse Maternal Grandfather     OBJECTIVE:  Vitals:   10/24/19 1252 10/24/19 1254  BP:  135/83  Pulse:  90  Resp:  18  Temp:  98.8 F (37.1 C)  TempSrc:  Oral  SpO2:  97%  Weight: 233 lb 11 oz (106 kg)   Height: 5\' 5"  (1.651 m)      General appearance: alert; appears fatigued, but nontoxic, speaking in full sentences and managing own secretions HEENT: NCAT; Ears: EACs clear, middle ear effusion present bilaterally; Eyes: PERRL, EOMI grossly; Nose: no obvious rhinorrhea;  Throat: oropharynx clear, tonsils 1+ and mildly erythematous without white tonsillar exudates, uvula midline Neck: supple without LAD Lungs: CTA bilaterally without adventitious breath sounds; cough absent Heart: regular rate and rhythm.  Radial pulses 2+ symmetrical bilaterally Skin: warm and dry Psychological: alert and cooperative; normal mood and affect  LABS: Results for orders placed or performed during the hospital encounter of 10/24/19 (from the past 24 hour(s))  POCT rapid strep A     Status: None   Collection Time: 10/24/19  1:17 PM  Result Value Ref Range   Rapid Strep A Screen Negative Negative     ASSESSMENT & PLAN:  1. Sore throat   2. Acute MEE (middle ear effusion), bilateral     Meds ordered this encounter  Medications  . fluticasone (FLONASE) 50 MCG/ACT nasal spray    Sig: Place 1 spray into both nostrils daily for 14 days.    Dispense:  16 g    Refill:  0  . cetirizine (ZYRTEC ALLERGY) 10 MG tablet    Sig: Take 1 tablet (10 mg total) by mouth daily.    Dispense:  30 tablet    Refill:  0  . lidocaine (XYLOCAINE) 2 % solution    Sig: Use as directed 15 mLs in the mouth or throat as needed for mouth pain.    Dispense:  100 mL    Refill:  0  . menthol-cetylpyridinium (CEPACOL) 3 MG lozenge    Sig: Take 1 lozenge (3 mg total) by mouth as needed for sore throat.    Dispense:  100 tablet    Refill:  0    Discharge instructions  Strep test negative, will send out for culture and we will call you with results Get plenty of rest and push fluids Zyrtec D prescribed. Use daily for symptomatic relief Flonase was prescribed Viscous lidocaine prescribed.  This is an oral solution you can swish, and gargle as needed for symptomatic relief of sore throat.  Do not exceed 8 doses in a 24 hour period.  Do not use prior to eating, as this will numb your entire mouth.   Drink warm or cool liquids, use throat lozenges, or popsicles to help alleviate symptoms Take OTC  ibuprofen or tylenol as needed for pain Follow up with PCP if symptoms persists Return or go to ER if patient has any new or worsening symptoms such as fever, chills, nausea, vomiting, worsening sore throat, cough, abdominal pain, chest pain, changes in bowel or bladder habits, etc...  Reviewed expectations re: course of current medical issues. Questions answered. Outlined signs and symptoms  indicating need for more acute intervention. Patient verbalized understanding. After Visit Summary given.     Note: This document was prepared using Dragon voice recognition software and may include unintentional dictation errors.    Durward Parcel, FNP 10/24/19 1403    Durward Parcel, FNP 10/24/19 [phone]

## 2019-10-24 NOTE — Discharge Instructions (Addendum)
Strep test negative, will send out for culture and we will call you with results Get plenty of rest and push fluids Zyrtec D prescribed. Use daily for symptomatic relief Flonase was prescribed Viscous lidocaine prescribed.  This is an oral solution you can swish, and gargle as needed for symptomatic relief of sore throat.  Do not exceed 8 doses in a 24 hour period.  Do not use prior to eating, as this will numb your entire mouth.   Drink warm or cool liquids, use throat lozenges, or popsicles to help alleviate symptoms Take OTC ibuprofen or tylenol as needed for pain Follow up with PCP if symptoms persists Return or go to ER if patient has any new or worsening symptoms such as fever, chills, nausea, vomiting, worsening sore throat, cough, abdominal pain, chest pain, changes in bowel or bladder habits, etc..Marland Kitchen

## 2019-10-26 LAB — CULTURE, GROUP A STREP (THRC)

## 2019-12-26 ENCOUNTER — Other Ambulatory Visit: Payer: Self-pay

## 2019-12-26 ENCOUNTER — Ambulatory Visit
Admission: EM | Admit: 2019-12-26 | Discharge: 2019-12-26 | Disposition: A | Discharge status: Home / Self Care | Payer: Medicaid Other | Attending: Emergency Medicine | Admitting: Emergency Medicine

## 2019-12-27 LAB — NOVEL CORONAVIRUS, NAA: SARS-CoV-2, NAA: NOT DETECTED

## 2019-12-27 LAB — SARS-COV-2, NAA 2 DAY TAT

## 2020-03-21 ENCOUNTER — Other Ambulatory Visit: Payer: Self-pay

## 2020-03-21 ENCOUNTER — Encounter (HOSPITAL_COMMUNITY): Payer: Self-pay | Admitting: Emergency Medicine

## 2020-03-21 ENCOUNTER — Emergency Department (HOSPITAL_COMMUNITY)
Admission: EM | Admit: 2020-03-21 | Discharge: 2020-03-21 | Disposition: A | Discharge status: Home / Self Care | Payer: Medicaid Other | Attending: Emergency Medicine | Admitting: Emergency Medicine

## 2020-03-21 DIAGNOSIS — R519 Headache, unspecified: Secondary | ICD-10-CM | POA: Insufficient documentation

## 2020-03-21 DIAGNOSIS — Z5321 Procedure and treatment not carried out due to patient leaving prior to being seen by health care provider: Secondary | ICD-10-CM | POA: Insufficient documentation

## 2020-03-21 DIAGNOSIS — I10 Essential (primary) hypertension: Secondary | ICD-10-CM | POA: Insufficient documentation

## 2020-03-21 NOTE — ED Triage Notes (Signed)
Pt c/o hypertension, headache and seeing spots. Pt states she hasnt taken her bp meds in a few months.

## 2020-04-11 ENCOUNTER — Other Ambulatory Visit (INDEPENDENT_AMBULATORY_CARE_PROVIDER_SITE_OTHER): Payer: Self-pay | Admitting: *Deleted

## 2020-04-11 ENCOUNTER — Other Ambulatory Visit (HOSPITAL_COMMUNITY)
Admission: RE | Admit: 2020-04-11 | Discharge: 2020-04-11 | Disposition: A | Discharge status: Home / Self Care | Payer: Medicaid Other | Source: Ambulatory Visit | Attending: Obstetrics & Gynecology | Admitting: Obstetrics & Gynecology

## 2020-04-11 ENCOUNTER — Other Ambulatory Visit: Payer: Self-pay

## 2020-04-11 ENCOUNTER — Encounter: Payer: Self-pay | Admitting: *Deleted

## 2020-04-11 DIAGNOSIS — Z113 Encounter for screening for infections with a predominantly sexual mode of transmission: Secondary | ICD-10-CM | POA: Diagnosis not present

## 2020-04-11 NOTE — Progress Notes (Signed)
Chart reviewed for nurse visit. Agree with plan of care.  Adline Potter, NP 04/11/2020 9:55 AM

## 2020-04-11 NOTE — Progress Notes (Signed)
   NURSE VISIT- STD  SUBJECTIVE:  Roberta Guerrero is a 24 y.o. G0P0000 GYN patient female here for a vaginal swab for STD screen.  She reports the following symptoms: none for 0 days. Pt has noticed an odor on partner's penis.  Denies abnormal vaginal bleeding, significant pelvic pain, fever, or UTI symptoms.  OBJECTIVE:  There were no vitals taken for this visit.  Appears well, in no apparent distress  ASSESSMENT: Vaginal swab for STD screen  PLAN: Self-collected vaginal probe for Gonorrhea, Chlamydia, Trichomonas, Bacterial Vaginosis, Yeast sent to lab Treatment: to be determined once results are received Follow-up as needed if symptoms persist/worsen, or new symptoms develop  Malachy Mood  04/11/2020 9:46 AM

## 2020-04-14 LAB — CERVICOVAGINAL ANCILLARY ONLY
Bacterial Vaginitis (gardnerella): POSITIVE — AB
Candida Glabrata: NEGATIVE
Candida Vaginitis: NEGATIVE
Chlamydia: NEGATIVE
Comment: NEGATIVE
Comment: NEGATIVE
Comment: NEGATIVE
Comment: NEGATIVE
Comment: NEGATIVE
Comment: NORMAL
Neisseria Gonorrhea: NEGATIVE
Trichomonas: NEGATIVE

## 2020-04-15 ENCOUNTER — Other Ambulatory Visit: Payer: Self-pay | Admitting: Adult Health

## 2020-04-15 MED ORDER — METRONIDAZOLE 0.75 % VA GEL: 1.0000 | Freq: Every day | VAGINAL | 0 refills | Status: DC

## 2020-04-15 NOTE — Progress Notes (Signed)
rx sent for metrogel to [facility]

## 2020-05-24 ENCOUNTER — Ambulatory Visit
Admission: EM | Admit: 2020-05-24 | Discharge: 2020-05-24 | Disposition: A | Discharge status: Home / Self Care | Payer: Medicaid Other | Attending: Emergency Medicine | Admitting: Emergency Medicine

## 2020-05-24 ENCOUNTER — Other Ambulatory Visit: Payer: Self-pay

## 2020-05-24 DIAGNOSIS — J029 Acute pharyngitis, unspecified: Secondary | ICD-10-CM

## 2020-05-24 DIAGNOSIS — J069 Acute upper respiratory infection, unspecified: Secondary | ICD-10-CM

## 2020-05-24 MED ORDER — BENZONATATE 100 MG PO CAPS: 100.0000 mg | ORAL_CAPSULE | Freq: Three times a day (TID) | ORAL | 0 refills | Status: DC | PRN

## 2020-05-24 MED ORDER — AZITHROMYCIN 250 MG PO TABS: 250.0000 mg | ORAL_TABLET | Freq: Every day | ORAL | 0 refills | Status: DC

## 2020-05-24 MED ORDER — PREDNISONE 10 MG PO TABS: 20.0000 mg | ORAL_TABLET | Freq: Every day | ORAL | 0 refills | Status: DC

## 2020-05-24 MED ORDER — LIDOCAINE VISCOUS HCL 2 % MT SOLN: 15.0000 mL | OROMUCOSAL | 0 refills | Status: DC | PRN

## 2020-05-24 NOTE — ED Provider Notes (Addendum)
[facility]   [id number] 05/24/20 Arrival Time: 0815   CC: URI symptoms  SUBJECTIVE: History from: patient.  Roberta Guerrero is a 25 y.o. female who presented to the urgent care with a complaint of sore throat, cough for the past few days.  Reported green blood-tinged mucus.  Denies sick exposure to COVID, flu or strep.  Has a negative COVID-19 test.  Denies recent travel.  Has tried OTC medication without relief.  Denies aggravating or alleviating factors.  Denies previous symptoms in the past.   Denies fever, chills, fatigue, sinus pain, rhinorrhea, sore throat, SOB, wheezing, chest pain, nausea, changes in bowel or bladder habits.     ROS: As per HPI.  All other pertinent ROS negative.      Past Medical History:  Diagnosis Date  . Allergic rhinitis   . Asthma   . Headache   . Hypertension   . Menstrual migraine    Past Surgical History:  Procedure Laterality Date  . CHOLECYSTECTOMY    . MOUTH SURGERY    . SKIN BIOPSY     Allergies  Allergen Reactions  . Pineapple Other (See Comments)    Makes tongue swell  . Corn-Containing Products Rash  . Other Rash    Nuts   No current facility-administered medications on file prior to encounter.   Current Outpatient Medications on File Prior to Encounter  Medication Sig Dispense Refill  . albuterol (VENTOLIN HFA) 108 (90 Base) MCG/ACT inhaler Inhale 2 puffs into the lungs every 4 (four) hours as needed for wheezing or shortness of breath. 6.7 g 1  . EPINEPHrine 0.3 mg/0.3 mL IJ SOAJ injection Inject 0.3 mg into the muscle once.    . fluticasone (FLONASE) 50 MCG/ACT nasal spray Place 1 spray into both nostrils daily for 14 days. 16 g 0  . metroNIDAZOLE (METROGEL VAGINAL) 0.75 % vaginal gel Place 1 Applicatorful vaginally at bedtime. 70 g 0   Social History   Socioeconomic History  . Marital status: Single    Spouse name: Not on file  . Number of children: Not on file  . Years of education: Not on file  . Highest  education level: Not on file  Occupational History  . Not on file  Tobacco Use  . Smoking status: Never Smoker  . Smokeless tobacco: Never Used  Vaping Use  . Vaping Use: Never used  Substance and Sexual Activity  . Alcohol use: Yes    Comment: occasional  . Drug use: No  . Sexual activity: Yes    Birth control/protection: None  Other Topics Concern  . Not on file  Social History Narrative  . Not on file   Social Determinants of Health   Financial Resource Strain: Not on file  Food Insecurity: Not on file  Transportation Needs: Not on file  Physical Activity: Not on file  Stress: Not on file  Social Connections: Not on file  Intimate Partner Violence: Not on file   Family History  Problem Relation Age of Onset  . Hypertension Mother   . Hypertension Father   . Hyperlipidemia Father   . Hypertension Maternal Grandmother   . Alcohol abuse Maternal Grandfather     OBJECTIVE:  Vitals:   05/24/20 0835  BP: 130/85  Pulse: 75  Resp: 20  Temp: 98.2 F (36.8 C)  SpO2: 96%     General appearance: alert; appears fatigued, but nontoxic; speaking in full sentences and tolerating own secretions HEENT: NCAT; Ears: EACs clear, TMs pearly  gray; Eyes: PERRL.  EOM grossly intact. Sinuses: nontender; Nose: nares patent without rhinorrhea, Throat: oropharynx clear, tonsils non erythematous or enlarged, uvula midline  Neck: supple without LAD Lungs: unlabored respirations, symmetrical air entry; cough: moderate; no respiratory distress; CTAB Heart: regular rate and rhythm.  Radial pulses 2+ symmetrical bilaterally Skin: warm and dry Psychological: alert and cooperative; normal mood and affect  LABS:  No results found for this or any previous visit (from the past 24 hour(s)).   ASSESSMENT & PLAN:  1. URI with cough and congestion   2. Sore throat     Meds ordered this encounter  Medications  . DISCONTD: benzonatate (TESSALON) 100 MG capsule    Sig: Take 1 capsule (100  mg total) by mouth 3 (three) times daily as needed for cough.    Dispense:  30 capsule    Refill:  0  . lidocaine (XYLOCAINE) 2 % solution    Sig: Use as directed 15 mLs in the mouth or throat as needed for mouth pain.    Dispense:  100 mL    Refill:  0  . azithromycin (ZITHROMAX) 250 MG tablet    Sig: Take 1 tablet (250 mg total) by mouth daily. Take first 2 tablets together, then 1 every day until finished.    Dispense:  6 tablet    Refill:  0  . benzonatate (TESSALON) 100 MG capsule    Sig: Take 1 capsule (100 mg total) by mouth 3 (three) times daily as needed for cough.    Dispense:  30 capsule    Refill:  0  . predniSONE (DELTASONE) 10 MG tablet    Sig: Take 2 tablets (20 mg total) by mouth daily.    Dispense:  15 tablet    Refill:  0    Discharge instructions   Get plenty of rest and push fluids Tessalon Perles prescribed for cough Lidocaine mouthwash was prescribed Azithromycin  prescribed Prednisone  prescribed Use medications daily for symptom relief Use OTC medications like ibuprofen or tylenol as needed fever or pain Call or go to the ED if you have any new or worsening symptoms such as fever, worsening cough, shortness of breath, chest tightness, chest pain, turning blue, changes in mental status, etc...   Reviewed expectations re: course of current medical issues. Questions answered. Outlined signs and symptoms indicating need for more acute intervention. Patient verbalized understanding. After Visit Summary given.         Durward Parcel, FNP 05/24/20 0850    Durward Parcel, FNP 05/24/20 6294    Durward Parcel, FNP 05/24/20 [phone]

## 2020-05-24 NOTE — Discharge Instructions (Addendum)
Get plenty of rest and push fluids Tessalon Perles prescribed for cough Lidocaine mouthwash was prescribed Azithromycin  prescribed Prednisone  prescribed Use medications daily for symptom relief Use OTC medications like ibuprofen or tylenol as needed fever or pain Call or go to the ED if you have any new or worsening symptoms such as fever, worsening cough, shortness of breath, chest tightness, chest pain, turning blue, changes in mental status, etc..Marland Kitchen

## 2020-05-24 NOTE — ED Triage Notes (Signed)
Pt presents with c/o sore throat and cough for past few days, has had blood tinged mucus

## 2020-05-29 ENCOUNTER — Other Ambulatory Visit: Payer: Medicaid Other | Admitting: Adult Health

## 2020-06-06 ENCOUNTER — Other Ambulatory Visit (HOSPITAL_COMMUNITY)
Admission: RE | Admit: 2020-06-06 | Discharge: 2020-06-06 | Disposition: A | Discharge status: Home / Self Care | Payer: Medicaid Other | Source: Ambulatory Visit | Attending: Adult Health | Admitting: Adult Health

## 2020-06-06 ENCOUNTER — Ambulatory Visit (INDEPENDENT_AMBULATORY_CARE_PROVIDER_SITE_OTHER): Payer: Medicaid Other | Admitting: Adult Health

## 2020-06-06 ENCOUNTER — Encounter: Payer: Self-pay | Admitting: Adult Health

## 2020-06-06 ENCOUNTER — Other Ambulatory Visit: Payer: Self-pay

## 2020-06-06 VITALS — BP 124/78 | HR 84 | Ht 65.75 in | Wt 237.0 lb

## 2020-06-06 DIAGNOSIS — Z113 Encounter for screening for infections with a predominantly sexual mode of transmission: Secondary | ICD-10-CM | POA: Insufficient documentation

## 2020-06-06 DIAGNOSIS — Z124 Encounter for screening for malignant neoplasm of cervix: Secondary | ICD-10-CM

## 2020-06-06 DIAGNOSIS — Z0000 Encounter for general adult medical examination without abnormal findings: Secondary | ICD-10-CM | POA: Insufficient documentation

## 2020-06-06 DIAGNOSIS — Z01419 Encounter for gynecological examination (general) (routine) without abnormal findings: Secondary | ICD-10-CM

## 2020-06-06 DIAGNOSIS — Z3009 Encounter for other general counseling and advice on contraception: Secondary | ICD-10-CM | POA: Diagnosis present

## 2020-06-06 NOTE — Progress Notes (Signed)
Patient ID: Roberta Guerrero, female   DOB: February 08, 1996, 25 y.o.   MRN: [id number] History of Present Illness:  Roberta Guerrero is a 25 year old black female,single, G0P0, in for a well woman gyn exam and pap. She has lost about 69 lbs in last 2 years. PCP is Dr Gerda Diss   Current Medications, Allergies, Past Medical History, Past Surgical History, Family History and Social History were reviewed in [employer] electronic medical record.     Review of Systems: Patient denies any headaches, hearing loss, fatigue, blurred vision, shortness of breath, chest pain, abdominal pain, problems with bowel movements, urination, or intercourse. No joint pain or mood swings.   Physical Exam:BP 124/78 (BP Location: Left Arm, Patient Position: Sitting, Cuff Size: Large)   Pulse 84   Ht 5' 5.75" (1.67 m)   Wt 237 lb (107.5 kg)   LMP 05/20/2020   BMI 38.54 kg/m  General:  Well developed, well nourished, no acute distress Skin:  Warm and dry Neck:  Midline trachea, normal thyroid, good ROM, no lymphadenopathy Lungs; Clear to auscultation bilaterally Breast:  No dominant palpable mass, retraction, or nipple discharge Cardiovascular: Regular rate and rhythm Abdomen:  Soft, non tender, no hepatosplenomegaly Pelvic:  External genitalia is normal in appearance, no lesions.  The vagina is normal in appearance. Urethra has no lesions or masses. The cervix is smooth, pap with GC/CHL performed.  Uterus is felt to be normal size, shape, and contour.  No adnexal masses or tenderness noted.Bladder is non tender, no masses felt. Extremities/musculoskeletal:  No swelling or varicosities noted, no clubbing or cyanosis Psych:  No mood changes, alert and cooperative,seems happy AA is 5 Fall risk is low PHQ 9 score is 3 GAD 7 score is 0  Upstream - 06/06/20 1011      Pregnancy Intention Screening   Does the patient want to become pregnant in the next year? Yes    Does the patient's partner want to become pregnant in the  next year? Yes    Would the patient like to discuss contraceptive options today? No      Contraception Wrap Up   Current Method Pregnant/Seeking Pregnancy    End Method Pregnant/Seeking Pregnancy    Contraception Counseling Provided No         Examination chaperoned by Malachy Mood LPN   Impression and Plan: 1. Routine medical exam Pap sent  2. Family planning Check HIV and RPR, hepatitis C antibody  3. Encounter for gynecological examination with Papanicolaou smear of cervix Pap sent, with GC/CH Physical in 1 year Pap in 3 years if normal Take OTC PNV Have boyfriend see urologist has ?issue with "tubes down there"  4. Screening examination for STD (sexually transmitted disease) Check HIV, RPR and hepatitis C antibody

## 2020-06-07 LAB — CYTOLOGY - PAP
Chlamydia: NEGATIVE
Comment: NEGATIVE
Comment: NORMAL
Diagnosis: NEGATIVE
Neisseria Gonorrhea: NEGATIVE

## 2020-06-07 LAB — HIV ANTIBODY (ROUTINE TESTING W REFLEX): HIV Screen 4th Generation wRfx: NONREACTIVE

## 2020-06-07 LAB — HEPATITIS C ANTIBODY: Hep C Virus Ab: <0.1 s/co ratio (ref 0.0–0.9)

## 2020-06-07 LAB — RPR: RPR Ser Ql: NONREACTIVE

## 2020-11-05 ENCOUNTER — Encounter (HOSPITAL_COMMUNITY): Payer: Self-pay | Admitting: Emergency Medicine

## 2020-11-05 ENCOUNTER — Other Ambulatory Visit: Payer: Self-pay

## 2020-11-05 ENCOUNTER — Emergency Department (HOSPITAL_COMMUNITY)
Admission: EM | Admit: 2020-11-05 | Discharge: 2020-11-05 | Disposition: A | Discharge status: Home / Self Care | Payer: Medicaid Other | Attending: Emergency Medicine | Admitting: Emergency Medicine

## 2020-11-05 DIAGNOSIS — Z5321 Procedure and treatment not carried out due to patient leaving prior to being seen by health care provider: Secondary | ICD-10-CM | POA: Insufficient documentation

## 2020-11-05 DIAGNOSIS — K122 Cellulitis and abscess of mouth: Secondary | ICD-10-CM | POA: Insufficient documentation

## 2020-11-05 NOTE — ED Triage Notes (Signed)
Pt c/o knot or abscess to top left side of her mouth.

## 2020-12-02 ENCOUNTER — Ambulatory Visit
Admission: EM | Admit: 2020-12-02 | Discharge: 2020-12-02 | Disposition: A | Discharge status: Home / Self Care | Payer: Medicaid Other | Attending: Family Medicine | Admitting: Family Medicine

## 2020-12-02 ENCOUNTER — Encounter: Payer: Self-pay | Admitting: Emergency Medicine

## 2020-12-02 DIAGNOSIS — U071 COVID-19: Secondary | ICD-10-CM

## 2020-12-02 MED ORDER — PROMETHAZINE-DM 6.25-15 MG/5ML PO SYRP: 5.0000 mL | ORAL_SOLUTION | Freq: Four times a day (QID) | ORAL | 0 refills | Status: DC | PRN

## 2020-12-02 MED ORDER — ALBUTEROL SULFATE HFA 108 (90 BASE) MCG/ACT IN AERS
2.0000 | INHALATION_SPRAY | Freq: Four times a day (QID) | RESPIRATORY_TRACT | Status: DC
  Administered 2020-12-02: 2 via RESPIRATORY_TRACT

## 2020-12-02 MED ORDER — PREDNISONE 20 MG PO TABS: 40.0000 mg | ORAL_TABLET | Freq: Every day | ORAL | 0 refills | Status: DC

## 2020-12-02 MED ORDER — NIRMATRELVIR/RITONAVIR (PAXLOVID)TABLET: 3.0000 | ORAL_TABLET | Freq: Two times a day (BID) | ORAL | 0 refills | Status: AC

## 2020-12-02 NOTE — ED Triage Notes (Signed)
Body aches since last night.  Ear pain this morning with sore throat and headache.  Covid test this morning was positive.

## 2020-12-02 NOTE — ED Provider Notes (Signed)
[facility]    CSN: [id number] Arrival date & time: 12/02/20  1124      History   Chief Complaint No chief complaint on file.   HPI Roberta Guerrero is a 25 y.o. female.   HPI Patient with history of hypertension and asthma presents today for evaluation of body aches, headaches, sore throat. Today patient developed ear pain, chills, wheezing, and cough. Endorses increase work of breathing. Afebrile.     Past Medical History:  Diagnosis Date   Allergic rhinitis    Asthma    Headache    Hypertension    Menstrual migraine     Patient Active Problem List   Diagnosis Date Noted   Screening examination for STD (sexually transmitted disease) 06/06/2020   Routine medical exam 06/06/2020   Asthma 03/14/2018   Asthma, chronic, unspecified asthma severity, with acute exacerbation 03/14/2018   Chlamydia 09/09/2017   Chronic hypertension 09/03/2017   Nexplanon in place 09/03/2017   Screen for STD (sexually transmitted disease) 09/03/2017   Family planning 09/03/2017   Encounter for gynecological examination with Papanicolaou smear of cervix 09/03/2017   Nexplanon insertion 07/18/2014   Essential hypertension, benign 03/23/2014   Food allergy 07/24/2013   Obesity, morbid (HCC) 06/14/2013   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 Authorizing Provider  nirmatrelvir/ritonavir EUA  (PAXLOVID) 20 x 150 MG & 10 x 100MG  TABS Take 3 tablets by mouth 2 (two) times daily for 5 days. Patient GFR is >60 Take nirmatrelvir (150 mg) two tablets twice daily for 5 days and ritonavir (100 mg) one tablet twice daily for 5 days. 12/02/20 12/07/20 Yes 12/09/20, FNP  predniSONE (DELTASONE) 20 MG tablet Take 2 tablets (40 mg total) by mouth daily with breakfast. 12/02/20  Yes 12/04/20, FNP  promethazine-dextromethorphan (PROMETHAZINE-DM) 6.25-15 MG/5ML syrup Take 5 mLs by mouth 4 (four) times daily as needed for cough. 12/02/20  Yes 12/04/20, FNP  albuterol (VENTOLIN HFA) 108 (90 Base) MCG/ACT inhaler Inhale 2 puffs into the lungs every 4 (four) hours as needed for wheezing or shortness of breath. 10/28/18   10/30/18, MD  fluticasone (FLONASE) 50 MCG/ACT nasal spray Place 1 spray into both nostrils daily for 14 days. 10/24/19 11/07/19  11/09/19, FNP    Family History Family History  Problem Relation Age of Onset   Hypertension Mother    Hypertension Father    Hyperlipidemia Father    Hypertension Maternal Grandmother    Alcohol abuse Maternal Grandfather     Social History Social History   Tobacco Use   Smoking status: Never  Smokeless tobacco: Never  Vaping Use   Vaping Use: Never used  Substance Use Topics   Alcohol use: Yes    Comment: occasional   Drug use: No     Allergies   Pineapple, Corn-containing products, and Other   Review of Systems Review of Systems Pertinent negatives listed in HPI  Physical Exam Triage Vital Signs ED Triage Vitals  Enc Vitals Group     BP 12/02/20 1424 (!) 176/68     Pulse Rate 12/02/20 1424 (!) 104     Resp 12/02/20 1424 20     Temp 12/02/20 1424 98.3 F (36.8 C)     Temp Source 12/02/20 1424 Temporal     SpO2 12/02/20 1424 98 %     Weight --      Height --      Head Circumference --      Peak Flow --      Pain Score 12/02/20 1209 6     Pain Loc --      Pain Edu? --      Excl. in GC?  --    No data found.  Updated Vital Signs BP (!) 176/68 (BP Location: Right Arm)   Pulse (!) 104   Temp 98.3 F (36.8 C) (Temporal)   Resp 20   LMP 11/03/2020 (Approximate)   SpO2 98%   Visual Acuity Right Eye Distance:   Left Eye Distance:   Bilateral Distance:    Right Eye Near:   Left Eye Near:    Bilateral Near:     Physical Exam General appearance: Alert, Ill-appearing, no distress Head: Normocephalic, without obvious abnormality, atraumatic ENT: Ear normal, nares with congestion, oropharynx patent Respiratory: Respirations even , unlabored, coarse lung sound, diffuse wheeze Heart: rate and rhythm normal. No gallop or murmurs noted on exam  Abdomen: BS +, no distention, no rebound tenderness, or no mass Extremities: No gross deformities Skin: Skin color, texture, turgor normal. No rashes seen  Psych: Appropriate mood and affect. Neurologic: GCS 15,normal coordination, normal gait   UC Treatments / Results  Labs (all labs ordered are listed, but only abnormal results are displayed) Labs Reviewed - No data to display  EKG   Radiology No results found.  Procedures Procedures (including critical care time)  Medications Ordered in UC Medications  albuterol (VENTOLIN HFA) 108 (90 Base) MCG/ACT inhaler 2 puff (2 puffs Inhalation Given 12/02/20 1441)    Initial Impression / Assessment and Plan / UC Course  I have reviewed the triage vital signs and the nursing notes.  Pertinent labs & imaging results that were available during my care of the patient were reviewed by me and considered in my medical decision making (see chart for details).    COVID 19 infection.  Treatment initiated with antivirals. Other symptoms management per discharge instructions. ER precautions. RTC as needed Final Clinical Impressions(s) / UC Diagnoses   Final diagnoses:  COVID-19 virus infection     Discharge Instructions      Start antiviral therapy today. Resume inhaler.  Start prednisone tomorrow. Alternate Tylenol and ibuprofen as needed for body aches and fever.  Symptom management per recommendations discussed today.  If any breathing difficulty or chest pain develops go immediately to the closest emergency department for evaluation.      ED Prescriptions     Medication Sig Dispense Auth. Provider   nirmatrelvir/ritonavir EUA (PAXLOVID) 20 x 150 MG & 10 x 100MG  TABS Take 3 tablets by mouth 2 (two) times daily for  5 days. Patient GFR is >60 Take nirmatrelvir (150 mg) two tablets twice daily for 5 days and ritonavir (100 mg) one tablet twice daily for 5 days. 30 tablet Bing Neighbors, FNP   predniSONE (DELTASONE) 20 MG tablet Take 2 tablets (40 mg total) by mouth daily with breakfast. 10 tablet Bing Neighbors, FNP   promethazine-dextromethorphan (PROMETHAZINE-DM) 6.25-15 MG/5ML syrup Take 5 mLs by mouth 4 (four) times daily as needed for cough. 120 mL Bing Neighbors, FNP      PDMP not reviewed this encounter.   Bing Neighbors, FNP 12/02/20 1447

## 2020-12-02 NOTE — Discharge Instructions (Addendum)
Start antiviral therapy today. Resume inhaler. Start prednisone tomorrow. Alternate Tylenol and ibuprofen as needed for body aches and fever.  Symptom management per recommendations discussed today.  If any breathing difficulty or chest pain develops go immediately to the closest emergency department for evaluation.

## 2020-12-19 ENCOUNTER — Other Ambulatory Visit (INDEPENDENT_AMBULATORY_CARE_PROVIDER_SITE_OTHER): Payer: Self-pay

## 2020-12-19 ENCOUNTER — Other Ambulatory Visit (HOSPITAL_COMMUNITY)
Admission: RE | Admit: 2020-12-19 | Discharge: 2020-12-19 | Disposition: A | Discharge status: Home / Self Care | Payer: Medicaid Other | Source: Ambulatory Visit | Attending: Obstetrics & Gynecology | Admitting: Obstetrics & Gynecology

## 2020-12-19 ENCOUNTER — Other Ambulatory Visit: Payer: Self-pay

## 2020-12-19 VITALS — BP 173/93 | HR 74 | Ht 65.0 in | Wt 211.6 lb

## 2020-12-19 DIAGNOSIS — Z113 Encounter for screening for infections with a predominantly sexual mode of transmission: Secondary | ICD-10-CM | POA: Diagnosis not present

## 2020-12-19 DIAGNOSIS — Z3201 Encounter for pregnancy test, result positive: Secondary | ICD-10-CM

## 2020-12-19 DIAGNOSIS — Z3200 Encounter for pregnancy test, result unknown: Secondary | ICD-10-CM

## 2020-12-19 LAB — POCT URINE PREGNANCY: Preg Test, Ur: POSITIVE — AB

## 2020-12-19 NOTE — Progress Notes (Signed)
Chart reviewed for nurse visit. Agree with plan of care.  Adline Potter, NP 12/19/2020 4:33 PM

## 2020-12-19 NOTE — Progress Notes (Signed)
     NURSE VISIT- PREGNANCY CONFIRMATION   SUBJECTIVE:  Roberta Guerrero is a 25 y.o. G1P0000 female at [redacted]w[redacted]d by certain LMP of Patient's last menstrual period was 10/30/2020 (exact date). Here for pregnancy confirmation.  Home pregnancy test: positive x 2   She reports nausea and vomiting.  She is not taking prenatal vitamins.    OBJECTIVE:  BP (!) 173/93 (BP Location: Right Arm, Patient Position: Sitting, Cuff Size: Normal)   Pulse 74   Ht 5\' 5"  (1.651 m)   Wt 211 lb 9.6 oz (96 kg)   LMP 10/30/2020 (Exact Date)   BMI 35.21 kg/m   Appears well, in no apparent distress  Results for orders placed or performed in visit on 12/19/20 (from the past 24 hour(s))  POCT urine pregnancy   Collection Time: 12/19/20  3:52 PM  Result Value Ref Range   Preg Test, Ur Positive (A) Negative    ASSESSMENT: Positive pregnancy test, [redacted]w[redacted]d by LMP    PLAN: Schedule for dating ultrasound in 1-2 weeks Prenatal vitamins: plans to begin OTC ASAP   Nausea medicines: not currently needed   OB packet given: Yes  NURSE VISIT- VAGINITIS/STD  SUBJECTIVE:  Roberta Guerrero is a 25 y.o. G0P0000 Unknown pregnantfemale here for a vaginal swab for vaginitis screening, STD screen.  She reports the following symptoms: none. Denies abnormal vaginal bleeding, significant pelvic pain, fever, or UTI symptoms.  OBJECTIVE:  BP (!) 173/93 (BP Location: Right Arm, Patient Position: Sitting, Cuff Size: Normal)   Pulse 74   Ht 5\' 5"  (1.651 m)   Wt 211 lb 9.6 oz (96 kg)   LMP 10/30/2020 (Exact Date)   BMI 35.21 kg/m   Appears well, in no apparent distress  ASSESSMENT: Vaginal swab for  vaginitis & STD screening  PLAN: Self-collected vaginal probe for Gonorrhea, Chlamydia, Trichomonas, Bacterial Vaginosis, Yeast sent to lab Treatment: to be determined once results are received Follow-up as needed if symptoms persist/worsen, or new symptoms develop  Jonathon Castelo A Lisett Dirusso  12/19/2020 4:24 PM

## 2020-12-23 ENCOUNTER — Other Ambulatory Visit: Payer: Self-pay | Admitting: Adult Health

## 2020-12-23 LAB — CERVICOVAGINAL ANCILLARY ONLY
Bacterial Vaginitis (gardnerella): POSITIVE — AB
Candida Glabrata: NEGATIVE
Candida Vaginitis: NEGATIVE
Chlamydia: NEGATIVE
Comment: NEGATIVE
Comment: NEGATIVE
Comment: NEGATIVE
Comment: NEGATIVE
Comment: NEGATIVE
Comment: NORMAL
Neisseria Gonorrhea: NEGATIVE
Trichomonas: NEGATIVE

## 2020-12-23 MED ORDER — METRONIDAZOLE 0.75 % VA GEL: 1.0000 | Freq: Every day | VAGINAL | 0 refills | Status: DC

## 2020-12-23 NOTE — Progress Notes (Signed)
Vaginal swab +BV rx metrogel

## 2020-12-26 ENCOUNTER — Telehealth: Payer: Self-pay | Admitting: Women's Health

## 2020-12-26 NOTE — Telephone Encounter (Signed)
Returned pt's call. Two identifiers used. Pt explained that she had sex with her partner on 9/7, got a swab on 9/8, and it was only positive for BV. Her partner got tested and was positive for Gonorrhea. She wants to be retested at her next appt on 9/19. Pt confirmed understanding.

## 2020-12-26 NOTE — Telephone Encounter (Signed)
Patient call with some questions about her test results.

## 2020-12-27 ENCOUNTER — Other Ambulatory Visit: Payer: Self-pay | Admitting: Obstetrics & Gynecology

## 2020-12-27 DIAGNOSIS — O3680X0 Pregnancy with inconclusive fetal viability, not applicable or unspecified: Secondary | ICD-10-CM

## 2020-12-30 ENCOUNTER — Other Ambulatory Visit (HOSPITAL_COMMUNITY)
Admission: RE | Admit: 2020-12-30 | Discharge: 2020-12-30 | Disposition: A | Discharge status: Home / Self Care | Payer: Medicaid Other | Source: Ambulatory Visit | Attending: Obstetrics & Gynecology | Admitting: Obstetrics & Gynecology

## 2020-12-30 ENCOUNTER — Encounter: Payer: Self-pay | Admitting: Women's Health

## 2020-12-30 ENCOUNTER — Other Ambulatory Visit (INDEPENDENT_AMBULATORY_CARE_PROVIDER_SITE_OTHER): Payer: Self-pay

## 2020-12-30 ENCOUNTER — Other Ambulatory Visit: Payer: Self-pay

## 2020-12-30 ENCOUNTER — Ambulatory Visit (INDEPENDENT_AMBULATORY_CARE_PROVIDER_SITE_OTHER): Payer: Medicaid Other

## 2020-12-30 DIAGNOSIS — Z3A08 8 weeks gestation of pregnancy: Secondary | ICD-10-CM

## 2020-12-30 DIAGNOSIS — Z113 Encounter for screening for infections with a predominantly sexual mode of transmission: Secondary | ICD-10-CM | POA: Insufficient documentation

## 2020-12-30 DIAGNOSIS — O3680X0 Pregnancy with inconclusive fetal viability, not applicable or unspecified: Secondary | ICD-10-CM

## 2020-12-30 NOTE — Progress Notes (Signed)
[place] 8+5 wks,single IUP with YS,FHR 164 bpm,CRL 18.54 mm,normal ovaries

## 2020-12-30 NOTE — Progress Notes (Addendum)
   NURSE VISIT- VAGINITIS/STD  SUBJECTIVE:  Roberta Guerrero is a 25 y.o. G1P0000 [redacted]w[redacted]d pregnantfemale here for a vaginal swab for vaginitis screening, STD screen.  She reports the following symptoms: pain and vulvar itching for 2 days. Denies abnormal vaginal bleeding, significant pelvic pain, fever, or UTI symptoms.  OBJECTIVE:  LMP 10/30/2020 (Exact Date)   Appears well, in no apparent distress  ASSESSMENT: Vaginal swab for  vaginitis & STD screening  PLAN: Self-collected vaginal probe for Gonorrhea, Chlamydia, Trichomonas, Bacterial Vaginosis, Yeast sent to lab Treatment: to be determined once results are received Follow-up as needed if symptoms persist/worsen, or new symptoms develop  Lamika Connolly A Cherine Drumgoole  12/30/2020 12:32 PM   Chart reviewed for nurse visit. Agree with plan of care.  Cheral Marker, [place] 12/30/2020 12:59 PM

## 2021-01-01 LAB — CERVICOVAGINAL ANCILLARY ONLY
Bacterial Vaginitis (gardnerella): POSITIVE — AB
Candida Glabrata: NEGATIVE
Candida Vaginitis: NEGATIVE
Chlamydia: NEGATIVE
Comment: NEGATIVE
Comment: NEGATIVE
Comment: NEGATIVE
Comment: NEGATIVE
Comment: NEGATIVE
Comment: NORMAL
Neisseria Gonorrhea: NEGATIVE
Trichomonas: NEGATIVE

## 2021-01-06 ENCOUNTER — Other Ambulatory Visit: Payer: Self-pay | Admitting: Women's Health

## 2021-01-06 MED ORDER — METRONIDAZOLE 0.75 % VA GEL: 1.0000 | Freq: Every day | VAGINAL | 0 refills | Status: DC

## 2021-01-27 ENCOUNTER — Encounter: Payer: Self-pay | Admitting: Advanced Practice Midwife

## 2021-01-27 DIAGNOSIS — O0990 Supervision of high risk pregnancy, unspecified, unspecified trimester: Secondary | ICD-10-CM | POA: Insufficient documentation

## 2021-01-29 ENCOUNTER — Other Ambulatory Visit: Payer: Self-pay | Admitting: Obstetrics & Gynecology

## 2021-01-29 DIAGNOSIS — Z3682 Encounter for antenatal screening for nuchal translucency: Secondary | ICD-10-CM

## 2021-01-30 ENCOUNTER — Encounter: Payer: Medicaid Other | Admitting: Advanced Practice Midwife

## 2021-01-30 ENCOUNTER — Ambulatory Visit (INDEPENDENT_AMBULATORY_CARE_PROVIDER_SITE_OTHER): Payer: Medicaid Other

## 2021-01-30 ENCOUNTER — Other Ambulatory Visit: Payer: Self-pay

## 2021-01-30 ENCOUNTER — Encounter: Payer: Self-pay | Admitting: Advanced Practice Midwife

## 2021-01-30 ENCOUNTER — Ambulatory Visit (INDEPENDENT_AMBULATORY_CARE_PROVIDER_SITE_OTHER): Payer: Medicaid Other | Admitting: Advanced Practice Midwife

## 2021-01-30 ENCOUNTER — Ambulatory Visit: Payer: Medicaid Other | Admitting: *Deleted

## 2021-01-30 ENCOUNTER — Other Ambulatory Visit: Payer: Medicaid Other

## 2021-01-30 VITALS — BP 141/88 | HR 72 | Wt 206.0 lb

## 2021-01-30 DIAGNOSIS — Z3A13 13 weeks gestation of pregnancy: Secondary | ICD-10-CM | POA: Diagnosis not present

## 2021-01-30 DIAGNOSIS — O0992 Supervision of high risk pregnancy, unspecified, second trimester: Secondary | ICD-10-CM

## 2021-01-30 DIAGNOSIS — I10 Essential (primary) hypertension: Secondary | ICD-10-CM | POA: Diagnosis not present

## 2021-01-30 DIAGNOSIS — Z3682 Encounter for antenatal screening for nuchal translucency: Secondary | ICD-10-CM

## 2021-01-30 DIAGNOSIS — O0991 Supervision of high risk pregnancy, unspecified, first trimester: Secondary | ICD-10-CM

## 2021-01-30 LAB — POCT URINALYSIS DIPSTICK OB
Blood, UA: NEGATIVE
Glucose, UA: NEGATIVE
Ketones, UA: NEGATIVE
Leukocytes, UA: NEGATIVE
Nitrite, UA: NEGATIVE
POC,PROTEIN,UA: NEGATIVE

## 2021-01-30 LAB — OB RESULTS CONSOLE GBS: GBS: POSITIVE

## 2021-01-30 MED ORDER — ALBUTEROL SULFATE HFA 108 (90 BASE) MCG/ACT IN AERS
2.0000 | INHALATION_SPRAY | Freq: Four times a day (QID) | RESPIRATORY_TRACT | 2 refills | Status: DC | PRN
Start: 2021-01-30 — End: 2021-07-03

## 2021-01-30 MED ORDER — ASPIRIN EC 81 MG PO TBEC
162.0000 mg | DELAYED_RELEASE_TABLET | Freq: Every day | ORAL | 6 refills | Status: DC
Start: 2021-01-30 — End: 2021-07-12

## 2021-01-30 MED ORDER — ONDANSETRON 4 MG PO TBDP: 4.0000 mg | ORAL_TABLET | Freq: Four times a day (QID) | ORAL | 2 refills | Status: DC | PRN

## 2021-01-30 NOTE — Patient Instructions (Signed)
Roberta Guerrero, I greatly value your feedback.  If you receive a survey following your visit with [place] today, we appreciate you taking the time to fill it out.  Thanks, Cathie Beams, DNP, CNM  [facility] HAS MOVED!!! It is now [facility] ([address] [place], [place] [address]) Entrance located off of [address] Free 24/7 valet parking   Nausea & Vomiting Have saltine crackers or pretzels by your bed and eat a few bites before you raise your head out of bed in the morning Eat small frequent meals throughout the day instead of large meals Drink plenty of fluids throughout the day to stay hydrated, just don't drink a lot of fluids with your meals.  This can make your stomach fill up faster making you feel sick Do not brush your teeth right after you eat Products with real ginger are good for nausea, like ginger ale and ginger hard candy Make sure it says made with real ginger! Sucking on sour candy like lemon heads is also good for nausea If your prenatal vitamins make you nauseated, take them at night so you will sleep through the nausea Sea Bands If you feel like you need medicine for the nausea & vomiting please let us know If you are unable to keep any fluids or food down please let us know   Constipation Drink plenty of fluid, preferably water, throughout the day Eat foods high in fiber such as fruits, vegetables, and grains Exercise, such as walking, is a good way to keep your bowels regular Drink warm fluids, especially warm prune juice, or decaf coffee Eat a 1/2 cup of real oatmeal (not instant), 1/2 cup applesauce, and 1/2-1 cup warm prune juice every day If needed, you may take Colace (docusate sodium) stool softener once or twice a day to help keep the stool soft.  If you still are having problems with constipation, you may take Miralax once daily as needed to help keep your bowels regular.   Home Blood Pressure Monitoring for  Patients   Your provider has recommended that you check your blood pressure (BP) at least once a week at home. If you do not have a blood pressure cuff at home, one will be provided for you. Contact your provider if you have not received your monitor within 1 week.   Helpful Tips for Accurate Home Blood Pressure Checks  Don't smoke, exercise, or drink caffeine 30 minutes before checking your BP Use the restroom before checking your BP (a full bladder can raise your pressure) Relax in a comfortable upright chair Feet on the ground Left arm resting comfortably on a flat surface at the level of your heart Legs uncrossed Back supported Sit quietly and don't talk Place the cuff on your bare arm Adjust snuggly, so that only two fingertips can fit between your skin and the top of the cuff Check 2 readings separated by at least one minute Keep a log of your BP readings For a visual, please reference this diagram: http://ccnc.care/bpdiagram  Provider Name: [facility]     Phone: [phone]  Zone 1: ALL CLEAR  Continue to monitor your symptoms:  BP reading is less than 140 (top number) or less than 90 (bottom number)  No right upper stomach pain No headaches or seeing spots No feeling nauseated or throwing up No swelling in face and hands  Zone 2: CAUTION Call your doctor's office for any of the following:  BP reading is greater than 140 (top number) or greater than 90 (bottom number)  Stomach pain under your ribs in the middle or right side Headaches or seeing spots Feeling nauseated or throwing up Swelling in face and hands  Zone 3: EMERGENCY  Seek immediate medical care if you have any of the following:  BP reading is greater than160 (top number) or greater than 110 (bottom number) Severe headaches not improving with Tylenol Serious difficulty catching your breath Any worsening symptoms from Zone 2    First Trimester of Pregnancy The first trimester of pregnancy is from  week 1 until the end of week 12 (months 1 through 3). A week after a sperm fertilizes an egg, the egg will implant on the wall of the uterus. This embryo will begin to develop into a baby. Genes from you and your partner are forming the baby. The female genes determine whether the baby is a boy or a girl. At 6-8 weeks, the eyes and face are formed, and the heartbeat can be seen on ultrasound. At the end of 12 weeks, all the baby's organs are formed.  Now that you are pregnant, you will want to do everything you can to have a healthy baby. Two of the most important things are to get good prenatal care and to follow your health care provider's instructions. Prenatal care is all the medical care you receive before the baby's birth. This care will help prevent, find, and treat any problems during the pregnancy and childbirth. BODY CHANGES Your body goes through many changes during pregnancy. The changes vary from woman to woman.  You may gain or lose a couple of pounds at first. You may feel sick to your stomach (nauseous) and throw up (vomit). If the vomiting is uncontrollable, call your health care provider. You may tire easily. You may develop headaches that can be relieved by medicines approved by your health care provider. You may urinate more often. Painful urination may mean you have a bladder infection. You may develop heartburn as a result of your pregnancy. You may develop constipation because certain hormones are causing the muscles that push waste through your intestines to slow down. You may develop hemorrhoids or swollen, bulging veins (varicose veins). Your breasts may begin to grow larger and become tender. Your nipples may stick out more, and the tissue that surrounds them (areola) may become darker. Your gums may bleed and may be sensitive to brushing and flossing. Dark spots or blotches (chloasma, mask of pregnancy) may develop on your face. This will likely fade after the baby is  born. Your menstrual periods will stop. You may have a loss of appetite. You may develop cravings for certain kinds of food. You may have changes in your emotions from day to day, such as being excited to be pregnant or being concerned that something may go wrong with the pregnancy and baby. You may have more vivid and strange dreams. You may have changes in your hair. These can include thickening of your hair, rapid growth, and changes in texture. Some women also have hair loss during or after pregnancy, or hair that feels dry or thin. Your hair will most likely return to normal after your baby is born. WHAT TO EXPECT AT YOUR PRENATAL VISITS During a routine prenatal visit: You will be weighed to make sure you and the baby are growing normally. Your blood pressure will be taken. Your abdomen will be measured to track your baby's growth. The fetal  heartbeat will be listened to starting around week 10 or 12 of your pregnancy. Test results from any previous visits will be discussed. Your health care provider may ask you: How you are feeling. If you are feeling the baby move. If you have had any abnormal symptoms, such as leaking fluid, bleeding, severe headaches, or abdominal cramping. If you have any questions. Other tests that may be performed during your first trimester include: Blood tests to find your blood type and to check for the presence of any previous infections. They will also be used to check for low iron levels (anemia) and Rh antibodies. Later in the pregnancy, blood tests for diabetes will be done along with other tests if problems develop. Urine tests to check for infections, diabetes, or protein in the urine. An ultrasound to confirm the proper growth and development of the baby. An amniocentesis to check for possible genetic problems. Fetal screens for spina bifida and Down syndrome. You may need other tests to make sure you and the baby are doing well. HOME CARE  INSTRUCTIONS  Medicines Follow your health care provider's instructions regarding medicine use. Specific medicines may be either safe or unsafe to take during pregnancy. Take your prenatal vitamins as directed. If you develop constipation, try taking a stool softener if your health care provider approves. Diet Eat regular, well-balanced meals. Choose a variety of foods, such as meat or vegetable-based protein, fish, milk and low-fat dairy products, vegetables, fruits, and whole grain breads and cereals. Your health care provider will help you determine the amount of weight gain that is right for you. Avoid raw meat and uncooked cheese. These carry germs that can cause birth defects in the baby. Eating four or five small meals rather than three large meals a day may help relieve nausea and vomiting. If you start to feel nauseous, eating a few soda crackers can be helpful. Drinking liquids between meals instead of during meals also seems to help nausea and vomiting. If you develop constipation, eat more high-fiber foods, such as fresh vegetables or fruit and whole grains. Drink enough fluids to keep your urine clear or pale yellow. Activity and Exercise Exercise only as directed by your health care provider. Exercising will help you: Control your weight. Stay in shape. Be prepared for labor and delivery. Experiencing pain or cramping in the lower abdomen or low back is a good sign that you should stop exercising. Check with your health care provider before continuing normal exercises. Try to avoid standing for long periods of time. Move your legs often if you must stand in one place for a long time. Avoid heavy lifting. Wear low-heeled shoes, and practice good posture. You may continue to have sex unless your health care provider directs you otherwise. Relief of Pain or Discomfort Wear a good support bra for breast tenderness.   Take warm sitz baths to soothe any pain or discomfort caused by  hemorrhoids. Use hemorrhoid cream if your health care provider approves.   Rest with your legs elevated if you have leg cramps or low back pain. If you develop varicose veins in your legs, wear support hose. Elevate your feet for 15 minutes, 3-4 times a day. Limit salt in your diet. Prenatal Care Schedule your prenatal visits by the twelfth week of pregnancy. They are usually scheduled monthly at first, then more often in the last 2 months before delivery. Write down your questions. Take them to your prenatal visits. Keep all your prenatal visits as directed  by your health care provider. Safety Wear your seat belt at all times when driving. Make a list of emergency phone numbers, including numbers for family, friends, the hospital, and police and fire departments. General Tips Ask your health care provider for a referral to a local prenatal education class. Begin classes no later than at the beginning of month 6 of your pregnancy. Ask for help if you have counseling or nutritional needs during pregnancy. Your health care provider can offer advice or refer you to specialists for help with various needs. Do not use hot tubs, steam rooms, or saunas. Do not douche or use tampons or scented sanitary pads. Do not cross your legs for long periods of time. Avoid cat litter boxes and soil used by cats. These carry germs that can cause birth defects in the baby and possibly loss of the fetus by miscarriage or stillbirth. Avoid all smoking, herbs, alcohol, and medicines not prescribed by your health care provider. Chemicals in these affect the formation and growth of the baby. Schedule a dentist appointment. At home, brush your teeth with a soft toothbrush and be gentle when you floss. SEEK MEDICAL CARE IF:  You have dizziness. You have mild pelvic cramps, pelvic pressure, or nagging pain in the abdominal area. You have persistent nausea, vomiting, or diarrhea. You have a bad smelling vaginal  discharge. You have pain with urination. You notice increased swelling in your face, hands, legs, or ankles. SEEK IMMEDIATE MEDICAL CARE IF:  You have a fever. You are leaking fluid from your vagina. You have spotting or bleeding from your vagina. You have severe abdominal cramping or pain. You have rapid weight gain or loss. You vomit blood or material that looks like coffee grounds. You are exposed to [place] measles and have never had them. You are exposed to fifth disease or chickenpox. You develop a severe headache. You have shortness of breath. You have any kind of trauma, such as from a fall or a car accident. Document Released: 03/24/2001 Document Revised: 08/14/2013 Document Reviewed: 02/07/2013 [facility] Patient Information 2015 [place], [place]. This information is not intended to replace advice given to you by your health care provider. Make sure you discuss any questions you have with your health care provider.  Coronavirus (COVID-19) Are you at risk?  Are you at risk for the Coronavirus (COVID-19)?  To be considered HIGH RISK for Coronavirus (COVID-19), you have to meet the following criteria:  Traveled to [place], [place], [place], [place] or [place];  and have fever, cough, and shortness of breath within the last 2 weeks of travel OR Been in close contact with a person diagnosed with COVID-19 within the last 2 weeks and have fever, cough, and shortness of breath IF YOU DO NOT MEET THESE CRITERIA, YOU ARE CONSIDERED LOW RISK FOR COVID-19.  What to do if you are HIGH RISK for COVID-19?  If you are having a medical emergency, call 911. Seek medical care right away. Before you go to a doctor's office, urgent care or emergency department, call ahead and tell them about your recent travel, contact with someone diagnosed with COVID-19, and your symptoms. You should receive instructions from your physician's office regarding next steps of care.  When you arrive at healthcare provider,  tell the healthcare staff immediately you have returned from visiting [place], [place], [place], [place] or [place]; in the last two weeks or you have been in close contact with a person diagnosed with COVID-19 in the last 2 weeks.  Tell the health care staff about your symptoms: fever, cough and shortness of breath. After you have been seen by a medical provider, you will be either: Tested for (COVID-19) and discharged home on quarantine except to seek medical care if symptoms worsen, and asked to  Stay home and avoid contact with others until you get your results (4-5 days)  Avoid travel on public transportation if possible (such as bus, train, or airplane) or Sent to the Emergency Department by EMS for evaluation, COVID-19 testing, and possible admission depending on your condition and test results.  What to do if you are LOW RISK for COVID-19?  Reduce your risk of any infection by using the same precautions used for avoiding the common cold or flu:  Wash your hands often with soap and warm water for at least 20 seconds.  If soap and water are not readily available, use an alcohol-based hand sanitizer with at least 60% alcohol.  If coughing or sneezing, cover your mouth and nose by coughing or sneezing into the elbow areas of your shirt or coat, into a tissue or into your sleeve (not your hands). Avoid shaking hands with others and consider head nods or verbal greetings only. Avoid touching your eyes, nose, or mouth with unwashed hands.  Avoid close contact with people who are sick. Avoid places or events with large numbers of people in one location, like concerts or sporting events. Carefully consider travel plans you have or are making. If you are planning any travel outside or inside the [place], visit the CDC's Travelers' Health webpage for the latest health notices. If you have some symptoms but not all symptoms, continue to monitor at home and seek medical attention if your symptoms worsen. If  you are having a medical emergency, call 911.   ADDITIONAL HEALTHCARE OPTIONS FOR [place] / e-Visit: [email or web address]         [facility]: [facility]: [id number]                   [facility]: [phone]     Safe Medications in Pregnancy   Acne: Benzoyl Peroxide Salicylic Acid  Backache/Headache: Tylenol: 2 regular strength every 4 hours OR              2 Extra strength every 6 hours  Colds/Coughs/Allergies: Benadryl (alcohol free) 25 mg every 6 hours as needed Breath right strips Claritin Cepacol throat lozenges Chloraseptic throat spray Cold-Eeze- up to three times per day Cough drops, alcohol free Flonase (by prescription only) Guaifenesin Mucinex Robitussin DM (plain only, alcohol free) Saline nasal spray/drops Sudafed (pseudoephedrine) & Actifed ** use only after [redacted] weeks gestation and if you do not have high blood pressure Tylenol Vicks Vaporub Zinc lozenges Zyrtec   Constipation: Colace Ducolax suppositories Fleet enema Glycerin suppositories Metamucil Milk of magnesia Miralax Senokot Smooth move tea  Diarrhea: Kaopectate Imodium A-D  *NO pepto Bismol  Hemorrhoids: Anusol Anusol HC Preparation H Tucks  Indigestion: Tums Maalox Mylanta Zantac  Pepcid  Insomnia: Benadryl (alcohol free) 36m every 6 hours as needed Tylenol PM Unisom, no Gelcaps  Leg Cramps: Tums MagGel  Nausea/Vomiting:  Bonine Dramamine Emetrol Ginger extract Sea bands Meclizine  Nausea medication to take during pregnancy:  Unisom (doxylamine succinate 25 mg tablets) Take one tablet daily at bedtime. If symptoms are not adequately controlled, the dose can be increased to a maximum recommended dose of two tablets daily (1/2 tablet in  the morning, 1/2 tablet mid-afternoon and one at bedtime). Vitamin B6 160m tablets. Take one  tablet twice a day (up to 200 mg per day).  Skin Rashes: Aveeno products Benadryl cream or 250mevery 6 hours as needed Calamine Lotion 1% cortisone cream  Yeast infection: Gyne-lotrimin 7 Monistat 7   **If taking multiple medications, please check labels to avoid duplicating the same active ingredients **take medication as directed on the label ** Do not exceed 4000 mg of tylenol in 24 hours **Do not take medications that contain aspirin or ibuprofen

## 2021-01-30 NOTE — Progress Notes (Signed)
INITIAL OBSTETRICAL VISIT Patient name: Roberta Guerrero MRN [id number]  Date of birth: April 27, 1995 Chief Complaint:   Initial Prenatal Visit  History of Present Illness:   Roberta Guerrero is a 25 y.o. G16P0000 African American female at [redacted]w[redacted]d by LMP c/w u/s at 8 weeks with an Estimated Date of Delivery: 08/06/21 being seen today for her initial obstetrical visit.   Her obstetrical history is significant for first baby.   Today she reports  nausea has improved, would like some PRN zofran .  Dx w/CHTN years ago. Has lost >100 # (diet, exercise) over the past several years. Stopped BP meds as BP improved after weight loss.  Depression screen [facility] 2/9 01/30/2021 06/06/2020 07/14/2016  Decreased Interest 0 0 0  Down, Depressed, Hopeless 0 0 0  PHQ - 2 Score 0 0 0  Altered sleeping 1 0 -  Tired, decreased energy 3 2 -  Change in appetite 2 0 -  Feeling bad or failure about yourself  0 1 -  Trouble concentrating 0 0 -  Moving slowly or fidgety/restless 0 0 -  Suicidal thoughts 0 0 -  PHQ-9 Score 6 3 -    Patient's last menstrual period was 10/30/2020 (exact date). Last pap 06/06/20. Results were: normal Review of Systems:   Pertinent items are noted in HPI Denies cramping/contractions, leakage of fluid, vaginal bleeding, abnormal vaginal discharge w/ itching/odor/irritation, headaches, visual changes, shortness of breath, chest pain, abdominal pain, severe nausea/vomiting, or problems with urination or bowel movements unless otherwise stated above.  Pertinent History Reviewed:  Reviewed past medical,surgical, social, obstetrical and family history.  Reviewed problem list, medications and allergies. OB History  Gravida Para Term Preterm AB Living  1 0 0 0 0 0  SAB IAB Ectopic Multiple Live Births  0 0 0 0 0    # Outcome Date GA Lbr Len/2nd Weight Sex Delivery Anes PTL Lv  1 Current            Physical Assessment:   Vitals:   01/30/21 0848  BP: (!) 141/88  Pulse: 72  Weight: 206 lb  (93.4 kg)  Body mass index is 34.28 kg/m.       Physical Examination:  General appearance - well appearing, and in no distress  Mental status - alert, oriented to person, place, and time  Psych:  She has a normal mood and affect  Skin - warm and dry, normal color, no suspicious lesions noted  Chest - effort normal  Heart - normal rate and regular rhythm  Abdomen - soft, nontender  Extremities:  No swelling or varicosities noted   TODAY'S NT Roberta 13+1 wks,measurements c/w dates,anterior placenta,normal ovaries,NB present,NT 1.6 mm,crl 65.70 mm,fhr 150 bpm  Results for orders placed or performed in visit on 01/30/21 (from the past 24 hour(s))  POC Urinalysis Dipstick OB   Collection Time: 01/30/21  9:35 AM  Result Value Ref Range   Color, UA     Clarity, UA     Glucose, UA Negative Negative   Bilirubin, UA     Ketones, UA neg    Spec Grav, UA     Blood, UA neg    pH, UA     POC,PROTEIN,UA Negative Negative, Trace, Small (1+), Moderate (2+), Large (3+), 4+   Urobilinogen, UA     Nitrite, UA neg    Leukocytes, UA Negative Negative   Appearance     Odor      Assessment & Plan:  1) High-Risk  Pregnancy G1P0000 at [redacted]w[redacted]d with an Estimated Date of Delivery: 08/06/21   2) Initial OB visit  3) CHTN, no meds.  Start ASA 162mg         Growth u/s q4wks     no antenatal testing   Deliver @ 38-39.6wks   Meds: No orders of the defined types were placed in this encounter.   Initial labs obtained Continue prenatal vitamins Reviewed n/v relief measures and warning s/s to report Reviewed recommended weight gain based on pre-gravid BMI Encouraged well-balanced diet Genetic & carrier screening discussed: requests Panorama, NT/IT, and Horizon , declines AFP Ultrasound discussed; fetal survey: requested CCNC completed> form faxed if has or is planning to apply for medicaid The nature of [facility] for with multiple MDs and other Advanced Practice Providers was  explained to patient; also emphasized that fellows, residents, and students are part of our team. Has home bp cuff.. Check bp weekly, let [employer] know if >140/90.        Roberta Guerrero 9:45 AM

## 2021-01-30 NOTE — Progress Notes (Signed)
[place] 13+1 wks,measurements c/w dates,anterior placenta,normal ovaries,NB present,NT 1.6 mm,crl 65.70 mm,fhr 150 bpm

## 2021-01-31 LAB — PMP SCREEN PROFILE (10S), URINE
Amphetamine Scrn, Ur: NEGATIVE ng/mL
BARBITURATE SCREEN URINE: NEGATIVE ng/mL
BENZODIAZEPINE SCREEN, URINE: NEGATIVE ng/mL
CANNABINOIDS UR QL SCN: POSITIVE ng/mL — AB
Cocaine (Metab) Scrn, Ur: NEGATIVE ng/mL
Creatinine(Crt), U: 177.5 mg/dL (ref 20.0–300.0)
Methadone Screen, Urine: NEGATIVE ng/mL
OXYCODONE+OXYMORPHONE UR QL SCN: NEGATIVE ng/mL
Opiate Scrn, Ur: NEGATIVE ng/mL
Ph of Urine: 6.1 (ref 4.5–8.9)
Phencyclidine Qn, Ur: NEGATIVE ng/mL
Propoxyphene Scrn, Ur: NEGATIVE ng/mL

## 2021-02-01 LAB — GC/CHLAMYDIA PROBE AMP
Chlamydia trachomatis, NAA: NEGATIVE
Neisseria Gonorrhoeae by PCR: NEGATIVE

## 2021-02-03 LAB — INTEGRATED 1
Crown Rump Length: 65.7 mm
Gest. Age on Collection Date: 12.7 weeks
Maternal Age at EDD: 25.5 yr
Nuchal Translucency (NT): 1.6 mm
Number of Fetuses: 1
PAPP-A Value: 1530.3 ng/mL
Weight: 206 [lb_av]

## 2021-02-03 LAB — PROTEIN / CREATININE RATIO, URINE
Creatinine, Urine: 160.4 mg/dL
Protein, Ur: 12.1 mg/dL
Protein/Creat Ratio: 75 mg/g creat (ref 0–200)

## 2021-02-03 LAB — COMPREHENSIVE METABOLIC PANEL
ALT: 12 IU/L (ref 0–32)
AST: 17 IU/L (ref 0–40)
Albumin/Globulin Ratio: 1.7 (ref 1.2–2.2)
Albumin: 4.5 g/dL (ref 3.9–5.0)
Alkaline Phosphatase: 38 IU/L — ABNORMAL LOW (ref 44–121)
BUN/Creatinine Ratio: 10 (ref 9–23)
BUN: 7 mg/dL (ref 6–20)
Bilirubin Total: 0.4 mg/dL (ref 0.0–1.2)
CO2: 21 mmol/L (ref 20–29)
Calcium: 9 mg/dL (ref 8.7–10.2)
Chloride: 100 mmol/L (ref 96–106)
Creatinine, Ser: 0.68 mg/dL (ref 0.57–1.00)
Globulin, Total: 2.6 g/dL (ref 1.5–4.5)
Glucose: 71 mg/dL (ref 70–99)
Potassium: 4.3 mmol/L (ref 3.5–5.2)
Sodium: 133 mmol/L — ABNORMAL LOW (ref 134–144)
Total Protein: 7.1 g/dL (ref 6.0–8.5)
eGFR: 125 mL/min/{1.73_m2} (ref >59)

## 2021-02-03 LAB — CBC/D/PLT+RPR+RH+ABO+RUBIGG...
Antibody Screen: NEGATIVE
Basophils Absolute: 0.1 10*3/uL (ref 0.0–0.2)
Basos: 1 %
EOS (ABSOLUTE): 0.2 10*3/uL (ref 0.0–0.4)
Eos: 4 %
HCV Ab: <0.1 s/co ratio (ref 0.0–0.9)
HIV Screen 4th Generation wRfx: NONREACTIVE
Hematocrit: 37.2 % (ref 34.0–46.6)
Hemoglobin: 12.6 g/dL (ref 11.1–15.9)
Hepatitis B Surface Ag: NEGATIVE
Immature Grans (Abs): 0 10*3/uL (ref 0.0–0.1)
Immature Granulocytes: 0 %
Lymphocytes Absolute: 1.4 10*3/uL (ref 0.7–3.1)
Lymphs: 29 %
MCH: 28.8 pg (ref 26.6–33.0)
MCHC: 33.9 g/dL (ref 31.5–35.7)
MCV: 85 fL (ref 79–97)
Monocytes Absolute: 0.4 10*3/uL (ref 0.1–0.9)
Monocytes: 9 %
Neutrophils Absolute: 2.7 10*3/uL (ref 1.4–7.0)
Neutrophils: 57 %
Platelets: 266 10*3/uL (ref 150–450)
RBC: 4.37 x10E6/uL (ref 3.77–5.28)
RDW: 12.8 % (ref 11.7–15.4)
RPR Ser Ql: NONREACTIVE
Rh Factor: POSITIVE
Rubella Antibodies, IGG: 2.5 index (ref >0.99)
WBC: 4.9 10*3/uL (ref 3.4–10.8)

## 2021-02-03 LAB — HGB FRACTIONATION CASCADE
Hgb A2: 2.7 % (ref 1.8–3.2)
Hgb A: 97.3 % (ref 96.4–98.8)
Hgb F: 0 % (ref 0.0–2.0)
Hgb S: 0 %

## 2021-02-03 LAB — HCV INTERPRETATION

## 2021-02-04 LAB — URINE CULTURE

## 2021-02-06 ENCOUNTER — Other Ambulatory Visit: Payer: Self-pay | Admitting: Advanced Practice Midwife

## 2021-02-06 DIAGNOSIS — R8271 Bacteriuria: Secondary | ICD-10-CM | POA: Insufficient documentation

## 2021-02-06 MED ORDER — AMPICILLIN 500 MG PO CAPS: 500.0000 mg | ORAL_CAPSULE | Freq: Three times a day (TID) | ORAL | 0 refills | Status: DC

## 2021-02-07 ENCOUNTER — Encounter: Payer: Self-pay | Admitting: Advanced Practice Midwife

## 2021-02-27 ENCOUNTER — Other Ambulatory Visit: Payer: Self-pay

## 2021-02-27 ENCOUNTER — Ambulatory Visit (INDEPENDENT_AMBULATORY_CARE_PROVIDER_SITE_OTHER): Payer: Medicaid Other | Admitting: Advanced Practice Midwife

## 2021-02-27 ENCOUNTER — Encounter: Payer: Self-pay | Admitting: Advanced Practice Midwife

## 2021-02-27 VITALS — BP 122/84 | HR 64 | Wt 215.5 lb

## 2021-02-27 DIAGNOSIS — O0992 Supervision of high risk pregnancy, unspecified, second trimester: Secondary | ICD-10-CM

## 2021-02-27 DIAGNOSIS — Z3A17 17 weeks gestation of pregnancy: Secondary | ICD-10-CM

## 2021-02-27 DIAGNOSIS — Z363 Encounter for antenatal screening for malformations: Secondary | ICD-10-CM

## 2021-02-27 DIAGNOSIS — Z1379 Encounter for other screening for genetic and chromosomal anomalies: Secondary | ICD-10-CM

## 2021-02-27 NOTE — Patient Instructions (Addendum)
Roberta Guerrero, I greatly value your feedback.  If you receive a survey following your visit with [place] today, we appreciate you taking the time to fill it out.  Thanks, Cathie Beams, CNM     [facility] HAS MOVED!!! It is now [facility] at [facility] ([address] [place], [place] [address]) Entrance located off of [address] Free 24/7 valet parking   Go to [email or web address] to register for FREE online childbirth classes    Second Trimester of Pregnancy The second trimester is from week 14 through week 27 (months 4 through 6). The second trimester is often a time when you feel your best. Your body has adjusted to being pregnant, and you begin to feel better physically. Usually, morning sickness has lessened or quit completely, you may have more energy, and you may have an increase in appetite. The second trimester is also a time when the fetus is growing rapidly. At the end of the sixth month, the fetus is about 9 inches long and weighs about 1 pounds. You will likely begin to feel the baby move (quickening) between 16 and 20 weeks of pregnancy. Body changes during your second trimester Your body continues to go through many changes during your second trimester. The changes vary from woman to woman. Your weight will continue to increase. You will notice your lower abdomen bulging out. You may begin to get stretch marks on your hips, abdomen, and breasts. You may develop headaches that can be relieved by medicines. The medicines should be approved by your health care provider. You may urinate more often because the fetus is pressing on your bladder. You may develop or continue to have heartburn as a result of your pregnancy. You may develop constipation because certain hormones are causing the muscles that push waste through your intestines to slow down. You may develop hemorrhoids or swollen, bulging veins (varicose veins). You may have back pain. This is  caused by: Weight gain. Pregnancy hormones that are relaxing the joints in your pelvis. A shift in weight and the muscles that support your balance. Your breasts will continue to grow and they will continue to become tender. Your gums may bleed and may be sensitive to brushing and flossing. Dark spots or blotches (chloasma, mask of pregnancy) may develop on your face. This will likely fade after the baby is born. A dark line from your belly button to the pubic area (linea nigra) may appear. This will likely fade after the baby is born. You may have changes in your hair. These can include thickening of your hair, rapid growth, and changes in texture. Some women also have hair loss during or after pregnancy, or hair that feels dry or thin. Your hair will most likely return to normal after your baby is born.  What to expect at prenatal visits During a routine prenatal visit: You will be weighed to make sure you and the fetus are growing normally. Your blood pressure will be taken. Your abdomen will be measured to track your baby's growth. The fetal heartbeat will be listened to. Any test results from the previous visit will be discussed.  Your health care provider may ask you: How you are feeling. If you are feeling the baby move. If you have had any abnormal symptoms, such as leaking fluid, bleeding, severe headaches, or abdominal cramping. If you are using any tobacco products, including cigarettes, chewing tobacco, and electronic cigarettes. If you have any questions.  Other tests  that may be performed during your second trimester include: Blood tests that check for: Low iron levels (anemia). High blood sugar that affects pregnant women (gestational diabetes) between 54 and 28 weeks. Rh antibodies. This is to check for a protein on red blood cells (Rh factor). Urine tests to check for infections, diabetes, or protein in the urine. An ultrasound to confirm the proper growth and  development of the baby. An amniocentesis to check for possible genetic problems. Fetal screens for spina bifida and Down syndrome. HIV (human immunodeficiency virus) testing. Routine prenatal testing includes screening for HIV, unless you choose not to have this test.  Follow these instructions at home: Medicines Follow your health care provider's instructions regarding medicine use. Specific medicines may be either safe or unsafe to take during pregnancy. Take a prenatal vitamin that contains at least 600 micrograms (mcg) of folic acid. If you develop constipation, try taking a stool softener if your health care provider approves. Eating and drinking Eat a balanced diet that includes fresh fruits and vegetables, whole grains, good sources of protein such as meat, eggs, or tofu, and low-fat dairy. Your health care provider will help you determine the amount of weight gain that is right for you. Avoid raw meat and uncooked cheese. These carry germs that can cause birth defects in the baby. If you have low calcium intake from food, talk to your health care provider about whether you should take a daily calcium supplement. Limit foods that are high in fat and processed sugars, such as fried and sweet foods. To prevent constipation: Drink enough fluid to keep your urine clear or pale yellow. Eat foods that are high in fiber, such as fresh fruits and vegetables, whole grains, and beans. Activity Exercise only as directed by your health care provider. Most women can continue their usual exercise routine during pregnancy. Try to exercise for 30 minutes at least 5 days a week. Stop exercising if you experience uterine contractions. Avoid heavy lifting, wear low heel shoes, and practice good posture. A sexual relationship may be continued unless your health care provider directs you otherwise. Relieving pain and discomfort Wear a good support bra to prevent discomfort from breast tenderness. Take  warm sitz baths to soothe any pain or discomfort caused by hemorrhoids. Use hemorrhoid cream if your health care provider approves. Rest with your legs elevated if you have leg cramps or low back pain. If you develop varicose veins, wear support hose. Elevate your feet for 15 minutes, 3-4 times a day. Limit salt in your diet. Prenatal Care Write down your questions. Take them to your prenatal visits. Keep all your prenatal visits as told by your health care provider. This is important. Safety Wear your seat belt at all times when driving. Make a list of emergency phone numbers, including numbers for family, friends, the hospital, and police and fire departments. General instructions Ask your health care provider for a referral to a local prenatal education class. Begin classes no later than the beginning of month 6 of your pregnancy. Ask for help if you have counseling or nutritional needs during pregnancy. Your health care provider can offer advice or refer you to specialists for help with various needs. Do not use hot tubs, steam rooms, or saunas. Do not douche or use tampons or scented sanitary pads. Do not cross your legs for long periods of time. Avoid cat litter boxes and soil used by cats. These carry germs that can cause birth defects in the baby  and possibly loss of the fetus by miscarriage or stillbirth. Avoid all smoking, herbs, alcohol, and unprescribed drugs. Chemicals in these products can affect the formation and growth of the baby. Do not use any products that contain nicotine or tobacco, such as cigarettes and e-cigarettes. If you need help quitting, ask your health care provider. Visit your dentist if you have not gone yet during your pregnancy. Use a soft toothbrush to brush your teeth and be gentle when you floss. Contact a health care provider if: You have dizziness. You have mild pelvic cramps, pelvic pressure, or nagging pain in the abdominal area. You have persistent  nausea, vomiting, or diarrhea. You have a bad smelling vaginal discharge. You have pain when you urinate. Get help right away if: You have a fever. You are leaking fluid from your vagina. You have spotting or bleeding from your vagina. You have severe abdominal cramping or pain. You have rapid weight gain or weight loss. You have shortness of breath with chest pain. You notice sudden or extreme swelling of your face, hands, ankles, feet, or legs. You have not felt your baby move in over an hour. You have severe headaches that do not go away when you take medicine. You have vision changes. Summary The second trimester is from week 14 through week 27 (months 4 through 6). It is also a time when the fetus is growing rapidly. Your body goes through many changes during pregnancy. The changes vary from woman to woman. Avoid all smoking, herbs, alcohol, and unprescribed drugs. These chemicals affect the formation and growth your baby. Do not use any tobacco products, such as cigarettes, chewing tobacco, and e-cigarettes. If you need help quitting, ask your health care provider. Contact your health care provider if you have any questions. Keep all prenatal visits as told by your health care provider. This is important. This information is not intended to replace advice given to you by your health care provider. Make sure you discuss any questions you have with your health care provider.   For Headaches:  Stay well hydrated, drink enough water so that your urine is clear, sometimes if you are dehydrated you can get headaches Eat small frequent meals and snacks, sometimes if you are hungry you can get headaches Sometimes you get headaches during pregnancy from the pregnancy hormones You can try tylenol (1-2 regular strength 325mg  or 1-2 extra strength 500mg ) as directed on the box. The least amount of medication that works is best.  Cool compresses (cool wet washcloth or ice pack) to area of head  that is hurting You can also try drinking a caffeinated drink to see if this will help If not helping, try below:  For Prevention of Headaches/Migraines: CoQ10 100mg  three times daily Vitamin B2 400mg  daily Magnesium Oxide 400-600mg  daily  Foods to alleviate migraines:  1) dark leafy greens 2) avocado 3) tuna 4) salmon  5) beans and legumes  Foods to avoid: 1) Excessive (or irregular timing) coffee 3) aged cheeses 4) chocolate 5) citrus fruits 6) aspartame and other artifical sweeteners 7) yeast 8) MSG (in processed foods) 9) processed and cured meats 10) nuts and certain seeds 11) chicken livers and other organ meats 12) dairy products like buttermilk, sour cream, and yogurt 13) dried fruits like dates, figs, and raisins 14) garlic 15) onions 16) potato chips 17) pickled foods like olives and sauerkraut 18) some fresh fruits like ripe banana, papaya, red plums, raspberries, kiwi, pineapple 19) tomato-based products  Recommend to  keep a migraine diary: rate daily the severity of your headache (1-10) and what foods you eat that day to help determine patterns.   If You Get a Bad Headache/Migraine: Benadryl 25mg   Magnesium Oxide 1 large Gatorade 2 extra strength Tylenol (1,000mg  total) 1 cup coffee or Coke      If this doesn't help please call @ [phone]

## 2021-02-27 NOTE — Progress Notes (Signed)
   HIGH-RISK PREGNANCY VISIT Patient name: Roberta Guerrero MRN [id number]  Date of birth: Mar 05, 1996 Chief Complaint:   High Risk Gestation (2nd IT today; + headache x 1 week)  History of Present Illness:   Roberta Guerrero is a 25 y.o. G57P0000 female at [redacted]w[redacted]d with an Estimated Date of Delivery: 08/06/21 being seen today for ongoing management of a high-risk pregnancy complicated by [facility] currently on no meds Today she reports having had a HA for a week. Gets congested every season change, prefers not to take any meds. Stools have been hard for a few days; Increase fiber/water. Contractions: Not present. Vag. Bleeding: None.  Movement: Absent. denies leaking of fluid.  Review of Systems:   Pertinent items are noted in HPI Denies abnormal vaginal discharge w/ itching/odor/irritation, headaches, visual changes, shortness of breath, chest pain, abdominal pain, severe nausea/vomiting, or problems with urination or bowel movements unless otherwise stated above. Pertinent History Reviewed:  Reviewed past medical,surgical, social, obstetrical and family history.  Reviewed problem list, medications and allergies. Physical Assessment:   Vitals:   02/27/21 0913  BP: 122/84  Pulse: 64  Weight: 215 lb 8 oz (97.8 kg)  Body mass index is 35.86 kg/m.           Physical Examination:   General appearance: alert, well appearing, and in no distress and oriented to person, place, and time  Mental status: alert, oriented to person, place, and time  Skin: warm & dry   Extremities: Edema: Trace    Cardiovascular: normal heart rate noted  Respiratory: normal respiratory effort, no distress  Abdomen: gravid, soft, non-tender  Pelvic: Cervical exam deferred         Fetal Status: Fetal Heart Rate (bpm): 152   Movement: Absent    Fetal Surveillance Testing today: doppler   No results found for this or any previous visit (from the past 24 hour(s)).  Assessment & Plan:  1) High-risk pregnancy G1P0000 at [redacted]w[redacted]d  with an Estimated Date of Delivery: 08/06/21   2) CHTN, stable Treatment plan  ASA 162mg     Growth u/s q4wks     no antenatal testing   Deliver @ 38-39.6wks:______   Meds: No orders of the defined types were placed in this encounter.   Labs/procedures today: 2nd IT   Reviewed: Preterm labor symptoms and general obstetric precautions including but not limited to vaginal bleeding, contractions, leaking of fluid and fetal movement were reviewed in detail with the patient.  All questions were answered. Has home bp cuff.. Check bp weekly, let know if >140/90.   Follow-up: Return in about 2 weeks (around 03/13/2021) for 14/04/2020, LROB.  No future appointments.  Orders Placed This Encounter  Procedures   [id number] OB Comp + 14 Wk   INTEGRATED 2   [place] DNP, CNM 02/27/2021 9:37 AM

## 2021-03-01 LAB — INTEGRATED 2
AFP MoM: 1.15
Alpha-Fetoprotein: 34.5 ng/mL
Crown Rump Length: 65.7 mm
DIA MoM: 1.15
DIA Value: 144.6 pg/mL
Estriol, Unconjugated: 1.25 ng/mL
Gest. Age on Collection Date: 12.7 weeks
Gestational Age: 16.7 weeks
Maternal Age at EDD: 25.5 yr
Nuchal Translucency (NT): 1.6 mm
Nuchal Translucency MoM: 1.08
Number of Fetuses: 1
PAPP-A MoM: 2.17
PAPP-A Value: 1530.3 ng/mL
Test Results:: NEGATIVE
Weight: 206 [lb_av]
Weight: 216 [lb_av]
hCG MoM: 1.9
hCG Value: 48.9 IU/mL
uE3 MoM: 1.25

## 2021-03-12 ENCOUNTER — Other Ambulatory Visit: Payer: Self-pay

## 2021-03-12 ENCOUNTER — Other Ambulatory Visit (INDEPENDENT_AMBULATORY_CARE_PROVIDER_SITE_OTHER): Payer: Medicaid Other

## 2021-03-12 VITALS — BP 135/83 | HR 76 | Wt 218.0 lb

## 2021-03-12 DIAGNOSIS — R350 Frequency of micturition: Secondary | ICD-10-CM | POA: Diagnosis not present

## 2021-03-12 LAB — POCT URINALYSIS DIPSTICK OB
Blood, UA: NEGATIVE
Glucose, UA: NEGATIVE
Ketones, UA: NEGATIVE
Leukocytes, UA: NEGATIVE
Nitrite, UA: NEGATIVE
POC,PROTEIN,UA: NEGATIVE

## 2021-03-12 NOTE — Progress Notes (Addendum)
   NURSE VISIT- UTI SYMPTOMS   SUBJECTIVE:  Roberta Guerrero is a 25 y.o. G4P0000 female here for UTI symptoms. She is [redacted]w[redacted]d pregnant. She reports urinary frequency.  OBJECTIVE:  BP 135/83 (BP Location: Right Arm, Patient Position: Sitting, Cuff Size: Normal)   Pulse 76   Wt 218 lb (98.9 kg)   LMP 10/30/2020 (Exact Date)   BMI 36.28 kg/m   Appears well, in no apparent distress  Results for orders placed or performed in visit on 03/12/21 (from the past 24 hour(s))  POC Urinalysis Dipstick OB   Collection Time: 03/12/21  9:31 AM  Result Value Ref Range   Color, UA     Clarity, UA     Glucose, UA Negative Negative   Bilirubin, UA     Ketones, UA neg    Spec Grav, UA     Blood, UA neg    pH, UA     POC,PROTEIN,UA Negative Negative, Trace, Small (1+), Moderate (2+), Large (3+), 4+   Urobilinogen, UA     Nitrite, UA neg    Leukocytes, UA Negative Negative   Appearance     Odor      ASSESSMENT: Pregnancy [redacted]w[redacted]d with UTI symptoms and negative nitrites  PLAN: Discussed with Dr. Charlotta Newton   Rx sent by provider today: No Urine culture not sent Call or return to clinic prn if these symptoms worsen or fail to improve as anticipated. Follow-up: as scheduled   Llesenia Fogal A Meilech Virts  03/12/2021 9:34 AM   Chart reviewed for nurse visit. Agree with plan of care.  Myna Hidalgo, DO 03/13/2021 12:57 PM

## 2021-03-25 ENCOUNTER — Ambulatory Visit (INDEPENDENT_AMBULATORY_CARE_PROVIDER_SITE_OTHER): Payer: Medicaid Other | Admitting: Women's Health

## 2021-03-25 ENCOUNTER — Encounter: Payer: Self-pay | Admitting: Women's Health

## 2021-03-25 ENCOUNTER — Other Ambulatory Visit: Payer: Self-pay

## 2021-03-25 ENCOUNTER — Ambulatory Visit (INDEPENDENT_AMBULATORY_CARE_PROVIDER_SITE_OTHER): Payer: Medicaid Other

## 2021-03-25 VITALS — BP 138/80 | HR 85 | Wt 225.0 lb

## 2021-03-25 DIAGNOSIS — Z363 Encounter for antenatal screening for malformations: Secondary | ICD-10-CM

## 2021-03-25 DIAGNOSIS — O0992 Supervision of high risk pregnancy, unspecified, second trimester: Secondary | ICD-10-CM

## 2021-03-25 DIAGNOSIS — Z3A20 20 weeks gestation of pregnancy: Secondary | ICD-10-CM

## 2021-03-25 DIAGNOSIS — R8271 Bacteriuria: Secondary | ICD-10-CM

## 2021-03-25 DIAGNOSIS — O99891 Other specified diseases and conditions complicating pregnancy: Secondary | ICD-10-CM

## 2021-03-25 DIAGNOSIS — I10 Essential (primary) hypertension: Secondary | ICD-10-CM

## 2021-03-25 LAB — POCT URINALYSIS DIPSTICK OB
Blood, UA: NEGATIVE
Glucose, UA: NEGATIVE
Ketones, UA: NEGATIVE
Leukocytes, UA: NEGATIVE
Nitrite, UA: NEGATIVE

## 2021-03-25 NOTE — Patient Instructions (Signed)
Roberta Guerrero, thank you for choosing our office today! We appreciate the opportunity to meet your healthcare needs. You may receive a short survey by mail, e-mail, or through [employer]. If you are happy with your care we would appreciate if you could take just a few minutes to complete the survey questions. We read all of your comments and take your feedback very seriously. Thank you again for choosing our office.  Center for [employer] Team at [facility] at [facility] ([address] [place], [place] [address]) Entrance C, located off of [address] Free 24/7 valet parking  Go to [email or web address] to register for FREE online childbirth classes  Call the office [phone]) or go to [facility] if: You begin to severe cramping Your water breaks.  Sometimes it is a big gush of fluid, sometimes it is just a trickle that keeps getting your panties wet or running down your legs You have vaginal bleeding.  It is normal to have a small amount of spotting if your cervix was checked.   [facility] Pediatricians/Family Doctors [facility]): [address] Colette Ribas, [phone]           [facility]: [address], [phone]                [facility] Medicine [facility]): [address], [phone] (call to ask if accepting patients) [facility] Department: [address], [place], [phone]    [facility] Pediatricians/Family Doctors [facility]): [phone], [phone] [facility]: [address] [place], [phone] [facility]: [address], [phone]  [facility] Doctors  [facility]): [phone] [facility]: [address] [phone]   [facility] Doctors [facility]: [address] [phone]  [facility] Doctors  [facility]: [phone], [phone]  Home Blood Pressure Monitoring for Patients   Your provider has recommended that you check your blood pressure (BP) at least once a week at home. If you do not have a blood pressure cuff at home, one will be provided for you. Contact your provider if you have not received your monitor within 1 week.   Helpful Tips for Accurate Home Blood Pressure Checks  Don't smoke, exercise, or drink caffeine 30 minutes before checking your BP Use the restroom before checking your BP (a full bladder can raise your pressure) Relax in a comfortable upright chair Feet on the ground Left arm resting comfortably on a flat surface at the level of your heart Legs uncrossed Back supported Sit quietly and don't talk Place the cuff on your bare arm Adjust snuggly, so that only two fingertips can fit between your skin and the top of the cuff Check 2 readings separated by at least one minute Keep a log of your BP readings For a visual, please reference this diagram: http://ccnc.care/bpdiagram  Provider Name: [facility]     Phone: [phone]  Zone 1: ALL CLEAR  Continue to monitor your symptoms:  BP reading is less than 140 (top number) or less than 90 (bottom number)  No right upper stomach pain No headaches or seeing spots No feeling nauseated or throwing up No swelling in face and hands  Zone 2: CAUTION Call your doctor's office for any of the following:  BP reading is greater than 140 (top number) or greater than  90 (bottom number)  Stomach pain under your ribs in the middle or right side Headaches or seeing spots Feeling nauseated or throwing up Swelling in face and hands  Zone 3: EMERGENCY  Seek immediate medical care if you have any of the following:  BP reading is greater than160 (top number) or greater than 110 (bottom number) Severe headaches not improving with Tylenol Serious difficulty catching your breath Any worsening symptoms from  Zone 2     Second Trimester of Pregnancy The second trimester is from week 14 through week 27 (months 4 through 6). The second trimester is often a time when you feel your best. Your body has adjusted to being pregnant, and you begin to feel better physically. Usually, morning sickness has lessened or quit completely, you may have more energy, and you may have an increase in appetite. The second trimester is also a time when the fetus is growing rapidly. At the end of the sixth month, the fetus is about 9 inches long and weighs about 1 pounds. You will likely begin to feel the baby move (quickening) between 16 and 20 weeks of pregnancy. Body changes during your second trimester Your body continues to go through many changes during your second trimester. The changes vary from woman to woman. Your weight will continue to increase. You will notice your lower abdomen bulging out. You may begin to get stretch marks on your hips, abdomen, and breasts. You may develop headaches that can be relieved by medicines. The medicines should be approved by your health care provider. You may urinate more often because the fetus is pressing on your bladder. You may develop or continue to have heartburn as a result of your pregnancy. You may develop constipation because certain hormones are causing the muscles that push waste through your intestines to slow down. You may develop hemorrhoids or swollen, bulging veins (varicose veins). You may have back pain. This is caused by: Weight gain. Pregnancy hormones that are relaxing the joints in your pelvis. A shift in weight and the muscles that support your balance. Your breasts will continue to grow and they will continue to become tender. Your gums may bleed and may be sensitive to brushing and flossing. Dark spots or blotches (chloasma, mask of pregnancy) may develop on your face. This will likely fade after the baby is born. A dark line from your belly button to  the pubic area (linea nigra) may appear. This will likely fade after the baby is born. You may have changes in your hair. These can include thickening of your hair, rapid growth, and changes in texture. Some women also have hair loss during or after pregnancy, or hair that feels dry or thin. Your hair will most likely return to normal after your baby is born.  What to expect at prenatal visits During a routine prenatal visit: You will be weighed to make sure you and the fetus are growing normally. Your blood pressure will be taken. Your abdomen will be measured to track your baby's growth. The fetal heartbeat will be listened to. Any test results from the previous visit will be discussed.  Your health care provider may ask you: How you are feeling. If you are feeling the baby move. If you have had any abnormal symptoms, such as leaking fluid, bleeding, severe headaches, or abdominal cramping. If you are using any tobacco products, including cigarettes, chewing tobacco, and electronic cigarettes. If you have any questions.  Other tests that may be performed during   your second trimester include: Blood tests that check for: Low iron levels (anemia). High blood sugar that affects pregnant women (gestational diabetes) between 24 and 28 weeks. Rh antibodies. This is to check for a protein on red blood cells (Rh factor). Urine tests to check for infections, diabetes, or protein in the urine. An ultrasound to confirm the proper growth and development of the baby. An amniocentesis to check for possible genetic problems. Fetal screens for spina bifida and Down syndrome. HIV (human immunodeficiency virus) testing. Routine prenatal testing includes screening for HIV, unless you choose not to have this test.  Follow these instructions at home: Medicines Follow your health care provider's instructions regarding medicine use. Specific medicines may be either safe or unsafe to take during  pregnancy. Take a prenatal vitamin that contains at least 600 micrograms (mcg) of folic acid. If you develop constipation, try taking a stool softener if your health care provider approves. Eating and drinking Eat a balanced diet that includes fresh fruits and vegetables, whole grains, good sources of protein such as meat, eggs, or tofu, and low-fat dairy. Your health care provider will help you determine the amount of weight gain that is right for you. Avoid raw meat and uncooked cheese. These carry germs that can cause birth defects in the baby. If you have low calcium intake from food, talk to your health care provider about whether you should take a daily calcium supplement. Limit foods that are high in fat and processed sugars, such as fried and sweet foods. To prevent constipation: Drink enough fluid to keep your urine clear or pale yellow. Eat foods that are high in fiber, such as fresh fruits and vegetables, whole grains, and beans. Activity Exercise only as directed by your health care provider. Most women can continue their usual exercise routine during pregnancy. Try to exercise for 30 minutes at least 5 days a week. Stop exercising if you experience uterine contractions. Avoid heavy lifting, wear low heel shoes, and practice good posture. A sexual relationship may be continued unless your health care provider directs you otherwise. Relieving pain and discomfort Wear a good support bra to prevent discomfort from breast tenderness. Take warm sitz baths to soothe any pain or discomfort caused by hemorrhoids. Use hemorrhoid cream if your health care provider approves. Rest with your legs elevated if you have leg cramps or low back pain. If you develop varicose veins, wear support hose. Elevate your feet for 15 minutes, 3-4 times a day. Limit salt in your diet. Prenatal Care Write down your questions. Take them to your prenatal visits. Keep all your prenatal visits as told by your health  care provider. This is important. Safety Wear your seat belt at all times when driving. Make a list of emergency phone numbers, including numbers for family, friends, the hospital, and police and fire departments. General instructions Ask your health care provider for a referral to a local prenatal education class. Begin classes no later than the beginning of month 6 of your pregnancy. Ask for help if you have counseling or nutritional needs during pregnancy. Your health care provider can offer advice or refer you to specialists for help with various needs. Do not use hot tubs, steam rooms, or saunas. Do not douche or use tampons or scented sanitary pads. Do not cross your legs for long periods of time. Avoid cat litter boxes and soil used by cats. These carry germs that can cause birth defects in the baby and possibly loss of the   fetus by miscarriage or stillbirth. Avoid all smoking, herbs, alcohol, and unprescribed drugs. Chemicals in these products can affect the formation and growth of the baby. Do not use any products that contain nicotine or tobacco, such as cigarettes and e-cigarettes. If you need help quitting, ask your health care provider. Visit your dentist if you have not gone yet during your pregnancy. Use a soft toothbrush to brush your teeth and be gentle when you floss. Contact a health care provider if: You have dizziness. You have mild pelvic cramps, pelvic pressure, or nagging pain in the abdominal area. You have persistent nausea, vomiting, or diarrhea. You have a bad smelling vaginal discharge. You have pain when you urinate. Get help right away if: You have a fever. You are leaking fluid from your vagina. You have spotting or bleeding from your vagina. You have severe abdominal cramping or pain. You have rapid weight gain or weight loss. You have shortness of breath with chest pain. You notice sudden or extreme swelling of your face, hands, ankles, feet, or legs. You  have not felt your baby move in over an hour. You have severe headaches that do not go away when you take medicine. You have vision changes. Summary The second trimester is from week 14 through week 27 (months 4 through 6). It is also a time when the fetus is growing rapidly. Your body goes through many changes during pregnancy. The changes vary from woman to woman. Avoid all smoking, herbs, alcohol, and unprescribed drugs. These chemicals affect the formation and growth your baby. Do not use any tobacco products, such as cigarettes, chewing tobacco, and e-cigarettes. If you need help quitting, ask your health care provider. Contact your health care provider if you have any questions. Keep all prenatal visits as told by your health care provider. This is important. This information is not intended to replace advice given to you by your health care provider. Make sure you discuss any questions you have with your health care provider. Document Released: 03/24/2001 Document Revised: 09/05/2015 Document Reviewed: 05/31/2012 Elsevier Interactive Patient Education  2017 Elsevier Inc.  

## 2021-03-25 NOTE — Progress Notes (Signed)
HIGH-RISK PREGNANCY VISIT Patient name: Roberta Guerrero MRN [id number]  Date of birth: 1995/07/14 Chief Complaint:   Routine Prenatal Visit ([place] today)  History of Present Illness:   Roberta Guerrero is a 25 y.o. G44P0000 female at [redacted]w[redacted]d with an Estimated Date of Delivery: 08/06/21 being seen today for ongoing management of a high-risk pregnancy complicated by chronic hypertension currently on no meds.    Today she reports no complaints. Contractions: Not present. Vag. Bleeding: None.  Movement: Absent. denies leaking of fluid.   Depression screen [facility] 2/9 01/30/2021 06/06/2020 07/14/2016  Decreased Interest 0 0 0  Down, Depressed, Hopeless 0 0 0  PHQ - 2 Score 0 0 0  Altered sleeping 1 0 -  Tired, decreased energy 3 2 -  Change in appetite 2 0 -  Feeling bad or failure about yourself  0 1 -  Trouble concentrating 0 0 -  Moving slowly or fidgety/restless 0 0 -  Suicidal thoughts 0 0 -  PHQ-9 Score 6 3 -     GAD 7 : Generalized Anxiety Score 01/30/2021 06/06/2020  Nervous, Anxious, on Edge 0 0  Control/stop worrying 0 0  Worry too much - different things 0 0  Trouble relaxing 0 0  Restless 0 0  Easily annoyed or irritable 0 0  Afraid - awful might happen 0 0  Total GAD 7 Score 0 0     Review of Systems:   Pertinent items are noted in HPI Denies abnormal vaginal discharge w/ itching/odor/irritation, headaches, visual changes, shortness of breath, chest pain, abdominal pain, severe nausea/vomiting, or problems with urination or bowel movements unless otherwise stated above. Pertinent History Reviewed:  Reviewed past medical,surgical, social, obstetrical and family history.  Reviewed problem list, medications and allergies. Physical Assessment:   Vitals:   03/25/21 1116  BP: 138/80  Pulse: 85  Weight: 225 lb (102.1 kg)  Body mass index is 37.44 kg/m.           Physical Examination:   General appearance: alert, well appearing, and in no distress  Mental status: alert,  oriented to person, place, and time  Skin: warm & dry   Extremities: Edema: Trace    Cardiovascular: normal heart rate noted  Respiratory: normal respiratory effort, no distress  Abdomen: gravid, soft, non-tender  Pelvic: Cervical exam deferred         Fetal Status: Fetal Heart Rate (bpm): 144 u/s   Movement: Absent    Fetal Surveillance Testing today: [place] 20+6 wks,cephalic,anterior placenta gr 0,cx 4 cm,SVP of fluid 5 cm,LVEICF 1.5 mm,FHR 144 bpm,EFW 400 g 58%,anatomy complete    Chaperone: N/A    Results for orders placed or performed in visit on 03/25/21 (from the past 24 hour(s))  POC Urinalysis Dipstick OB   Collection Time: 03/25/21 11:20 AM  Result Value Ref Range   Color, UA     Clarity, UA     Glucose, UA Negative Negative   Bilirubin, UA     Ketones, UA neg    Spec Grav, UA     Blood, UA neg    pH, UA     POC,PROTEIN,UA Trace Negative, Trace, Small (1+), Moderate (2+), Large (3+), 4+   Urobilinogen, UA     Nitrite, UA neg    Leukocytes, UA Negative Negative   Appearance     Odor      Assessment & Plan:  High-risk pregnancy: G1P0000 at [redacted]w[redacted]d with an Estimated Date of Delivery: 08/06/21   1) CHTN,  stable, no meds, ASA  2) Fetal isolated EICF, neg Panorama, discussed and gave printed info  3) GBS bacteruria> in Oct, POC urine cx today  Meds: No orders of the defined types were placed in this encounter.   Labs/procedures today: U/S  Treatment Plan:   Growth u/s q4wks     no antenatal testing   Deliver @ 38-39.6wks:______   Reviewed: Preterm labor symptoms and general obstetric precautions including but not limited to vaginal bleeding, contractions, leaking of fluid and fetal movement were reviewed in detail with the patient.  All questions were answered. Does have home bp cuff. Office bp cuff given: not applicable. Check bp weekly, let us know if consistently >150 and/or >95.  Follow-up: Return in about 4 weeks (around 04/22/2021) for HROB, US:EFW, MD or CNM, in  person.   Future Appointments  Date Time Provider Department Center  04/22/2021 11:15 AM [facility] [place] [facility] None  04/22/2021  1:50 PM Lazaro Arms, MD [facility]  04/28/2021  8:30 AM Alfonse Spruce, MD [facility] None    Orders Placed This Encounter  Procedures   Urine Culture   US OB Follow Up   POC Urinalysis Dipstick OB   Cheral Marker CNM, [facility] 03/25/2021 12:06 PM

## 2021-03-25 NOTE — Progress Notes (Signed)
[place] 20+6 wks,cephalic,anterior placenta gr 0,cx 4 cm,SVP of fluid 5 cm,LVEICF 1.5 mm,FHR 144 bpm,EFW 400 g 58%,anatomy complete

## 2021-03-29 LAB — URINE CULTURE

## 2021-04-13 NOTE — L&D Delivery Note (Addendum)
Vaginal Delivery Note ? ?Pre-procedure Diagnosis: SIUP @ 104w1d ?Indications: 26 y.o. G1P0000 Estimated Date of Delivery: 08/06/21 here today for mIOL. Her pregnancy has been complicated by cHTN w/ SIPE & SF (HA) . Her labor course was complicated by HA refractory for pharmacological intervention, magnesium infusion.   ?Labor course was very long during prodromal/latent phase.  Once she got into a more active labor pattern, progress was rapid ?Post-procedure Diagnosis: SIUP @ [redacted]w[redacted]d ; same ? ?Provider: Greggory Keen, SNM; Hansel Feinstein CNM ? ?Anesthesia: epidural ? ?Complications: none ? ?Delivery Estimated Blood Loss (EBL): 80  mL ? ?Transfusions: none ? ?Pathology: sent to pathology ? ?Labor Events: ?Rupture date: 07/08/2021 , at 8:40 PM .  ?Rupture type: Artificial [2]  ?Fluid characteristic: Clear [1]  ?Interval from ROM to Delivery: 30h 25m ?Induction: Misoprostol [4];IP Foley [6];Pitocin [5]  ?Augmentation: None [1]  ?Sex: F ? ?Delivery Information for baby girl of Roberta Guerrero ?Time of Birth: 3:32 AM  ?Baby Weight:  pending  ? ?APGARS One minute Five minutes ?Totals: 8  9   ?Newborn is AGA and  well . Patient plans to breastfeed.   ? ?Procedure:  ? The patient was in semi fowlers, draped in a routine fashion.   ?SVD of a viable female infant in cephalic presentation delivered spontaneously over an intact supported perineum. No nuchal cord. Following delivery of fetal head, shoulders not delivered spontaneously. McRoberts used followed by grasping of left axilla and successful delivery of fetal body in less than 1 min. NICU present following delivery.  No meconium present. Nose and mouth suctioned with bulb; cord clamped and cut. The placenta was then delivered spontaneously and intact via Delena Bali; [facility]. Arterial gases obtain.The uterine fundus was firm after uterine massage and with a 300 ml bolus of 30 units of pitocin. Inspection revealed  no lacerations. No repair necessary.  Instrument, sponge, and needle  counts were correct at the end of the procedure. ? ? ?Disposition: stable to transfer to [facility] ? ? ?Electronically Signed By: ?Greggory Keen, SNM ?07/10/21 3:58 AM   ? ?Attestation: ? ?I confirm that I have verified the information documented in the  SNM ?s note and that I have also personally reperformed the physical exam and all medical decision making activities.  ? ?I was gloved and present for entire delivery ?SVD without incident ?I assisted directly with delivery of shoulders.  McRoberts did not accomplish delivery so we grasped the left axilla and were able to get shoulders delivered in less than one minute ?Lacerations as listed above ?Repair not indicated ? ?Seabron Spates, CNM ? ? ?

## 2021-04-22 ENCOUNTER — Other Ambulatory Visit: Payer: Self-pay

## 2021-04-22 ENCOUNTER — Ambulatory Visit (INDEPENDENT_AMBULATORY_CARE_PROVIDER_SITE_OTHER): Payer: Medicaid Other

## 2021-04-22 ENCOUNTER — Ambulatory Visit (INDEPENDENT_AMBULATORY_CARE_PROVIDER_SITE_OTHER): Payer: Medicaid Other | Admitting: Obstetrics & Gynecology

## 2021-04-22 ENCOUNTER — Encounter: Payer: Self-pay | Admitting: Obstetrics & Gynecology

## 2021-04-22 VITALS — BP 124/78 | HR 72 | Wt 229.0 lb

## 2021-04-22 DIAGNOSIS — R8271 Bacteriuria: Secondary | ICD-10-CM

## 2021-04-22 DIAGNOSIS — Z3A24 24 weeks gestation of pregnancy: Secondary | ICD-10-CM

## 2021-04-22 DIAGNOSIS — I10 Essential (primary) hypertension: Secondary | ICD-10-CM | POA: Diagnosis not present

## 2021-04-22 DIAGNOSIS — O0992 Supervision of high risk pregnancy, unspecified, second trimester: Secondary | ICD-10-CM | POA: Diagnosis not present

## 2021-04-22 NOTE — Progress Notes (Signed)
° °  HIGH-RISK PREGNANCY VISIT Patient name: Roberta Guerrero MRN [id number]  Date of birth: Jan 23, 1996 Chief Complaint:   Routine Prenatal Visit  History of Present Illness:   Roberta Guerrero is a 26 y.o. G65P0000 female at [redacted]w[redacted]d with an Estimated Date of Delivery: 08/06/21 being seen today for ongoing management of a high-risk pregnancy complicated by [facility].    Today she reports no complaints. Contractions: Not present.  .  Movement: Present. denies leaking of fluid.   Depression screen [facility] 2/9 01/30/2021 06/06/2020 07/14/2016  Decreased Interest 0 0 0  Down, Depressed, Hopeless 0 0 0  PHQ - 2 Score 0 0 0  Altered sleeping 1 0 -  Tired, decreased energy 3 2 -  Change in appetite 2 0 -  Feeling bad or failure about yourself  0 1 -  Trouble concentrating 0 0 -  Moving slowly or fidgety/restless 0 0 -  Suicidal thoughts 0 0 -  PHQ-9 Score 6 3 -     GAD 7 : Generalized Anxiety Score 01/30/2021 06/06/2020  Nervous, Anxious, on Edge 0 0  Control/stop worrying 0 0  Worry too much - different things 0 0  Trouble relaxing 0 0  Restless 0 0  Easily annoyed or irritable 0 0  Afraid - awful might happen 0 0  Total GAD 7 Score 0 0     Review of Systems:   Pertinent items are noted in HPI Denies abnormal vaginal discharge w/ itching/odor/irritation, headaches, visual changes, shortness of breath, chest pain, abdominal pain, severe nausea/vomiting, or problems with urination or bowel movements unless otherwise stated above. Pertinent History Reviewed:  Reviewed past medical,surgical, social, obstetrical and family history.  Reviewed problem list, medications and allergies. Physical Assessment:   Vitals:   04/22/21 1147  BP: 124/78  Pulse: 72  Weight: 229 lb (103.9 kg)  Body mass index is 38.11 kg/m.           Physical Examination:   General appearance: alert, well appearing, and in no distress  Mental status: alert, oriented to person, place, and time  Skin: warm & dry   Extremities:  Edema: Trace    Cardiovascular: normal heart rate noted  Respiratory: normal respiratory effort, no distress  Abdomen: gravid, soft, non-tender  Pelvic: Cervical exam deferred         Fetal Status:     Movement: Present    Fetal Surveillance Testing today: sonogram   Chaperone: N/A    No results found for this or any previous visit (from the past 24 hour(s)).  Assessment & Plan:  High-risk pregnancy: G1P0000 at [redacted]w[redacted]d with an Estimated Date of Delivery: 08/06/21   1) CHTN no meds, BP normal with EFW 68%    Meds: No orders of the defined types were placed in this encounter.   Labs/procedures today: U/S  Treatment Plan:  per protocol, PN2 next visit    Follow-up: Return in about 4 weeks (around 05/20/2021) for PN2, HROB.   Future Appointments  Date Time Provider Department Center  04/28/2021  8:30 AM Alfonse Spruce, MD [facility] None    No orders of the defined types were placed in this encounter.  Lazaro Arms  04/22/2021 12:02 PM

## 2021-04-22 NOTE — Progress Notes (Signed)
[place] 24+6 wks,breech,cx 4.1 cm,anterior placenta gr 0,svp of fluid 6 cm,normal ovaries,fhr 144 BPM,EFW 816 g 68%

## 2021-04-28 ENCOUNTER — Encounter: Payer: Self-pay | Admitting: Women's Health

## 2021-04-28 ENCOUNTER — Encounter: Payer: Self-pay | Admitting: Allergy & Immunology

## 2021-04-28 ENCOUNTER — Other Ambulatory Visit: Payer: Self-pay

## 2021-04-28 ENCOUNTER — Other Ambulatory Visit: Payer: Self-pay | Admitting: Allergy & Immunology

## 2021-04-28 ENCOUNTER — Ambulatory Visit (INDEPENDENT_AMBULATORY_CARE_PROVIDER_SITE_OTHER): Payer: Medicaid Other | Admitting: Allergy & Immunology

## 2021-04-28 VITALS — BP 128/82 | HR 80 | Temp 97.9°F | Resp 18 | Ht 65.5 in | Wt 229.6 lb

## 2021-04-28 DIAGNOSIS — J310 Chronic rhinitis: Secondary | ICD-10-CM

## 2021-04-28 DIAGNOSIS — R21 Rash and other nonspecific skin eruption: Secondary | ICD-10-CM | POA: Diagnosis not present

## 2021-04-28 DIAGNOSIS — J4520 Mild intermittent asthma, uncomplicated: Secondary | ICD-10-CM

## 2021-04-28 DIAGNOSIS — O99512 Diseases of the respiratory system complicating pregnancy, second trimester: Secondary | ICD-10-CM

## 2021-04-28 DIAGNOSIS — T7800XA Anaphylactic reaction due to unspecified food, initial encounter: Secondary | ICD-10-CM | POA: Insufficient documentation

## 2021-04-28 DIAGNOSIS — J45909 Unspecified asthma, uncomplicated: Secondary | ICD-10-CM

## 2021-04-28 DIAGNOSIS — T7800XD Anaphylactic reaction due to unspecified food, subsequent encounter: Secondary | ICD-10-CM

## 2021-04-28 MED ORDER — TRIAMCINOLONE ACETONIDE 0.1 % EX OINT: 1.0000 "application " | TOPICAL_OINTMENT | Freq: Two times a day (BID) | CUTANEOUS | 1 refills | Status: DC

## 2021-04-28 MED ORDER — PULMICORT FLEXHALER 180 MCG/ACT IN AEPB: 1.0000 | INHALATION_SPRAY | Freq: Two times a day (BID) | RESPIRATORY_TRACT | 5 refills | Status: DC

## 2021-04-28 NOTE — Progress Notes (Signed)
NEW PATIENT  Date of Service/Encounter:  04/28/21  Consult requested by: Pcp, No   Assessment:   Mild intermittent asthma complicating pregnancy in the second trimester with worsening course since becoming pregnant  Rash - unclear diagnosis  Chronic rhinitis  Anaphylactic shock due to food (tree nuts, corn, peanuts, pineapple) - rechecking levels today  Plan/Recommendations:   1. Rash - I am unsure of what this rash is, but we are going to get some lab work to try to figure this out.  - We will call you in 1-2 weeks with the results of the testing.  - We are looking for autoimmune disease as well as food allergies (including seafood). - Start triamcinolone 0.1% ointment twice daily every day for 2 weeks and then twice daily AS NEEDED thereafter to see if this helps.   2. Mild intermittent asthma, uncomplicated - Lung testing finally able to be done with albuterol treatments.  - Since you feeling that you are needing your Ventolin more often, I am going to start you on Pulmicort two puffs twice daily (this is the oldest inhaled steroid and safe to use during pregnancy).  - Daily controller medication(s): Pulmicort Flexhaler 172mcg 1 puffs twice daily - Prior to physical activity: Ventolin 2 puffs 10-15 minutes before physical activity. - Rescue medications: Ventolin 4 puffs every 4-6 hours as needed - Changes during respiratory infections or worsening symptoms: Increase Pulmicort to 2 puffs twice daily for TWO WEEKS. - Asthma control goals:  * Full participation in all desired activities (may need albuterol before activity) * Albuterol use two time or less a week on average (not counting use with activity) * Cough interfering with sleep two time or less a month * Oral steroids no more than once a year * No hospitalizations  3. Chronic rhinitis - We are going to update your allergy testing via the blood. - Start Flonase one spray per nostril daily to help with your nasal  symptoms.  4. Anaphylactic shock due to food - We are going to update your nut testing as well as the corn and pineapple. - We are also getting the seafood panel as well.   5. Return in about 4 weeks (around 05/26/2021).     This note in its entirety was forwarded to the Provider who requested this consultation.  Subjective:   Roberta Guerrero is a 26 y.o. female presenting today for evaluation of  Chief Complaint  Patient presents with   Asthma    Last seen 2016 - Patient states since she's pregnant her asthma is a little worse.   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  Roberta Guerrero was referred by Pcp, No.     Roberta Guerrero is a 26 y.o. female presenting for an evaluation of a rash .  She reports that the rashes on her legs popped up during the beginning of her second trimester. She also has some lesions on her bilateral arms.  She was never a big seafood eater but she has been craving it during this pregnancy. Otherwise she has had no changes to her diet. Rash comes and goes, although some of the lesions have remained for longer.   She has not tried anything for it at all. She did put some vaseline on it, but this only burns and she avoids it. She does report burning with the shower water as well as her lotion after bathing. She still moisturizes after bathing despite the burning. She has not tried any antihistamines. She used to take this but her "body builds up  tolerance fast". She has been avoiding taking anything during this pregnancy. She has not tried any hydrocortisone.   She has talked to her OB about this. She was told that she would probably not get allergy tested because of her pregnancy. A biopsy was also discussed. She has not been referred to Dermatology. Pregnancy has mostly been normal, although this is her first pregnancy so she is unsure what to expect. She did get a dog in October. She thinks that she was already having the lesions when they got the dog.  Her boyfriend has started getting some on his back, but this has now resolved. 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 No one else at work has this. There is no change in her housing situation.  She is listed as "high risk" for preeclampsia but she has lost a lot of weight prior to pregnancy. Bloood pressure and other markers have been perfect the entire time.  She was over 300 pounds for getting pregnant and lost a lot of weight after becoming pregnant.   Asthma/Respiratory Symptom History: She does have a history of intermittent asthma. She reports that she likely had COVID before it became a thing. This seemed to make her asthma worse and she was actually admitted for a couple of days in December 2019. She now has Ventolin and uses this as needed.  She was officially diagnosed with COVID19 in August 2022.  She tells me today that she should be using her Ventolin more than she does.  She does not want to overdo it, so she avoids using it when she feels that she needs it.  Does not seem that she was ever on a controller medication.  It is unclear if she ever got prednisone.  Allergic Rhinitis Symptom History: She has sneezing and itchy watery eyes. She did have testing done by Dr. Ishmael Holter she think she was positive to mold and dust mites.  We do not have access to her previous testing.  Food Allergy Symptom History: She has skin reactions to peanuts, corn, and pineapples. She is good  about avoiding these, but she has a lot of breakthrough symptoms. This is how they have presented. She never had anaphylactic reactions to this. She avoids all tree nuts as well.  She does not have an EpiPen.  Otherwise, there is no history of other atopic diseases, including drug allergies, stinging insect allergies, or contact dermatitis. There is no significant infectious history. Vaccinations are up to date.    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 If you have any questions, ask your nurse or doctor.          STOP taking these medications    MELATONIN PO Stopped by: Valentina Shaggy, MD   ondansetron 4 MG disintegrating tablet Commonly known as: Zofran ODT Stopped by: Valentina Shaggy, MD       TAKE these medications    albuterol 108 (90 Base) MCG/ACT inhaler Commonly known as: VENTOLIN HFA Inhale 2 puffs into the lungs every 6 (six) hours as needed for wheezing or shortness of breath.   aspirin EC 81 MG tablet Take 2 tablets (162 mg total) by mouth daily.   Pulmicort Flexhaler 180 MCG/ACT inhaler Generic drug: budesonide Inhale 1 puff into the lungs in the morning and at bedtime. Started by: Valentina Shaggy, MD   triamcinolone ointment 0.1 % Commonly known as: KENALOG Apply 1 application topically 2 (two) times daily. Started by: Valentina Shaggy, MD        Birth History: non-contributory  Developmental History: non-contributory  Past Surgical History: Past Surgical History:  Procedure Laterality Date   CHOLECYSTECTOMY     MOUTH SURGERY     SKIN BIOPSY       Family History: Family History  Problem Relation Age of Onset   Hypertension Mother    Hypertension Father    Hyperlipidemia Father    Autism Brother    Autism Brother    Hypertension Maternal Uncle    Hypertension Maternal Grandmother    Alcohol abuse Maternal Grandfather      Social History: Hector lives at home with her boyfriend and the dog, which they got in October 2022.  It is a pitbull named [place].  She lives in a house of unknown age.  There is mildew damage in the home.  They have gas heating and central cooling.  There are dust mite covers on the bed as well as the pillows.  There is tobacco exposure (her boyfriend smokes cigarettes both inside and outside of the home).  She currently works as a Occupational psychologist.  She has been in this position for 2 years and works at [employer].  She does not use a HEPA filter.  She does not live near an interstate or industrial area.   Review of Systems  Constitutional: Negative.  Negative for chills, fever, malaise/fatigue and weight loss.  HENT:  Positive for congestion. Negative for ear discharge and ear pain.   Eyes:  Negative for pain, discharge and redness.  Respiratory:  Positive for shortness of breath. Negative for cough, sputum production and wheezing.   Cardiovascular: Negative.  Negative for chest pain and palpitations.  Gastrointestinal:  Negative for abdominal pain, constipation, diarrhea, heartburn, nausea and vomiting.  Skin:  Positive for itching and rash.  Neurological:  Negative for dizziness and headaches.  Endo/Heme/Allergies:  Negative for environmental allergies. Does not bruise/bleed easily.      Objective:   Blood pressure 128/82, pulse 80, temperature 97.9 F (36.6 C),  resp. rate 18, height 5' 5.5" (1.664 m), weight 229 lb 9.6 oz (104.1 kg), last menstrual period 10/30/2020, SpO2 98 %. Body mass index is 37.63  kg/m.   Physical Exam:   Physical Exam Vitals reviewed.  Constitutional:      Appearance: She is well-developed.     Comments: Very pleasant and talkative.  HENT:     Head: Normocephalic and atraumatic.     Right Ear: Tympanic membrane, ear canal and external ear normal. No drainage, swelling or tenderness. Tympanic membrane is not injected, scarred, erythematous, retracted or bulging.     Left Ear: Tympanic membrane, ear canal and external ear normal. No drainage, swelling or tenderness. Tympanic membrane is not injected, scarred, erythematous, retracted or bulging.     Nose: No nasal deformity, septal deviation, mucosal edema or rhinorrhea.     Right Turbinates: Enlarged, swollen and pale.     Left Turbinates: Enlarged, swollen and pale.     Right Sinus: No maxillary sinus tenderness or frontal sinus tenderness.     Left Sinus: No maxillary sinus tenderness or frontal sinus tenderness.     Comments: No polyps appreciated.    Mouth/Throat:     Mouth: Mucous membranes are not pale and not dry.     Tongue: No lesions. Tongue does not deviate from midline.     Pharynx: Uvula midline.     Tonsils: No tonsillar exudate. 2+ on the right. 2+ on the left.  Eyes:     General:        Right eye: No discharge.        Left eye: No discharge.     Conjunctiva/sclera: Conjunctivae normal.     Right eye: Right conjunctiva is not injected. No chemosis.    Left eye: Left conjunctiva is not injected. No chemosis.    Pupils: Pupils are equal, round, and reactive to light.  Cardiovascular:     Rate and Rhythm: Normal rate and regular rhythm.     Heart sounds: Normal heart sounds.  Pulmonary:     Effort: Pulmonary effort is normal. No tachypnea, accessory muscle usage or respiratory distress.     Breath sounds: Normal breath sounds. No wheezing, rhonchi or  rales.     Comments: Slightly decreased air movement at bases, but she is moving air.  This is improved following the 4 puffs of Xopenex. Chest:     Chest wall: No tenderness.  Abdominal:     Tenderness: There is no abdominal tenderness. There is no guarding or rebound.  Lymphadenopathy:     Head:     Right side of head: No submandibular, tonsillar or occipital adenopathy.     Left side of head: No submandibular, tonsillar or occipital adenopathy.     Cervical: No cervical adenopathy.  Skin:    General: Skin is warm.     Capillary Refill: Capillary refill takes less than 2 seconds.     Coloration: Skin is not pale.     Findings: No abrasion, erythema, petechiae or rash. Rash is not papular, urticarial or vesicular.  Neurological:     Mental Status: She is alert.  Psychiatric:        Behavior: Behavior is cooperative.     Diagnostic studies:    Spirometry: results normal (FEV1: 2.50/83%, FVC: 3.09/88%, FEV1/FVC: 81%).    Spirometry consistent with normal pattern.  Allergy Studies: labs sent instead           Salvatore Marvel, MD [facility]

## 2021-04-28 NOTE — Patient Instructions (Addendum)
1. Rash - I am unsure of what this rash is, but we are going to get some lab work to try to figure this out.  - We will call you in 1-2 weeks with the results of the testing.  - We are looking for autoimmune disease as well as food allergies (including seafood). - Start triamcinolone 0.1% ointment twice daily every day for 2 weeks and then twice daily AS NEEDED thereafter to see if this helps.   2. Mild intermittent asthma, uncomplicated - Lung testing finally able to be done with albuterol treatments.  - Since you feeling that you are needing your Ventolin more often, I am going to start you on Pulmicort two puffs twice daily (this is the oldest inhaled steroid and safe to use during pregnancy).  - Daily controller medication(s): Pulmicort Flexhaler 1 puffs twice daily - Prior to physical activity: Ventolin 2 puffs 10-15 minutes before physical activity. - Rescue medications: Ventolin 4 puffs every 4-6 hours as needed - Changes during respiratory infections or worsening symptoms: Increase Pulmicort to 2 puffs twice daily for TWO WEEKS. - Asthma control goals:  * Full participation in all desired activities (may need albuterol before activity) * Albuterol use two time or less a week on average (not counting use with activity) * Cough interfering with sleep two time or less a month * Oral steroids no more than once a year * No hospitalizations  3. Chronic rhinitis - We are going to update your allergy testing via the blood. - Start Flonase one spray per nostril daily to help with your nasal symptoms.  4. Anaphylactic shock due to food - We are going to update your nut testing as well as the corn and pineapple. - We are also getting the seafood panel as well.   5. Return in about 4 weeks (around 05/26/2021).    Please inform us of any Emergency Department visits, hospitalizations, or changes in symptoms. Call us before going to the ED for breathing or allergy symptoms since we  might be able to fit you in for a sick visit. Feel free to contact us anytime with any questions, problems, or concerns.  It was a pleasure to meet you today!  Websites that have reliable patient information: 1. American Academy of Asthma, Allergy, and Immunology: www.aaaai.org 2. Food Allergy Research and Education (FARE): foodallergy.org 3. Mothers of Asthmatics: http://www.asthmacommunitynetwork.org 4. American College of Allergy, Asthma, and Immunology: www.acaai.org   COVID-19 Vaccine Information can be found at: [email or web address] For questions related to vaccine distribution or appointments, please email [email or web address] or call [phone].   We realize that you might be concerned about having an allergic reaction to the COVID19 vaccines. To help with that concern, WE ARE OFFERING THE COVID19 VACCINES IN OUR OFFICE! Ask the front desk for dates!     Like [place] on [employer] and Instagram for our latest updates!      A healthy democracy works best when [employer] participate! Make sure you are registered to vote! If you have moved or changed any of your contact information, you will need to get this updated before voting!  In some cases, you MAY be able to register to vote online: [email or web address]

## 2021-04-29 ENCOUNTER — Encounter: Payer: Self-pay | Admitting: Women's Health

## 2021-04-29 ENCOUNTER — Other Ambulatory Visit (HOSPITAL_COMMUNITY)
Admission: RE | Admit: 2021-04-29 | Discharge: 2021-04-29 | Disposition: A | Discharge status: Home / Self Care | Payer: Medicaid Other | Source: Ambulatory Visit | Attending: Women's Health | Admitting: Women's Health

## 2021-04-29 ENCOUNTER — Ambulatory Visit (INDEPENDENT_AMBULATORY_CARE_PROVIDER_SITE_OTHER): Payer: Medicaid Other | Admitting: Women's Health

## 2021-04-29 VITALS — BP 136/79 | HR 104 | Wt 232.4 lb

## 2021-04-29 DIAGNOSIS — O26892 Other specified pregnancy related conditions, second trimester: Secondary | ICD-10-CM | POA: Insufficient documentation

## 2021-04-29 DIAGNOSIS — O0992 Supervision of high risk pregnancy, unspecified, second trimester: Secondary | ICD-10-CM | POA: Insufficient documentation

## 2021-04-29 DIAGNOSIS — N898 Other specified noninflammatory disorders of vagina: Secondary | ICD-10-CM | POA: Diagnosis present

## 2021-04-29 DIAGNOSIS — I10 Essential (primary) hypertension: Secondary | ICD-10-CM

## 2021-04-29 NOTE — Progress Notes (Signed)
Work-in HIGH-RISK PREGNANCY VISIT Patient name: Roberta Guerrero MRN [id number]  Date of birth: 02-07-1996 Chief Complaint:   Routine Prenatal Visit (Vaginal discharge)  History of Present Illness:   Roberta Guerrero is a 26 y.o. G49P0000 female at 50w6dwith an Estimated Date of Delivery: 08/06/21 being seen today for ongoing management of a high-risk pregnancy complicated by chronic hypertension currently on no meds.    Today she reports vaginal discharge, itching, irritation, and odor x 1wk. Contractions: Not present. Vag. Bleeding: None.  Movement: Present. denies leaking of fluid.   [facility] 01/30/2021 06/06/2020 07/14/2016  Decreased Interest 0 0 0  Down, Depressed, Hopeless 0 0 0  PHQ - 2 Score 0 0 0  Altered sleeping 1 0 -  Tired, decreased energy 3 2 -  Change in appetite 2 0 -  Feeling bad or failure about yourself  0 1 -  Trouble concentrating 0 0 -  Moving slowly or fidgety/restless 0 0 -  Suicidal thoughts 0 0 -  PHQ-9 Score 6 3 -     GAD 7 : Generalized Anxiety Score 01/30/2021 06/06/2020  Nervous, Anxious, on Edge 0 0  Control/stop worrying 0 0  Worry too much - different things 0 0  Trouble relaxing 0 0  Restless 0 0  Easily annoyed or irritable 0 0  Afraid - awful might happen 0 0  Total GAD 7 Score 0 0     Review of Systems:   Pertinent items are noted in HPI Denies abnormal vaginal discharge w/ itching/odor/irritation, headaches, visual changes, shortness of breath, chest pain, abdominal pain, severe nausea/vomiting, or problems with urination or bowel movements unless otherwise stated above. Pertinent History Reviewed:  Reviewed past medical,surgical, social, obstetrical and family history.  Reviewed problem list, medications and allergies. Physical Assessment:   Vitals:   04/29/21 1415  BP: 136/79  Pulse: (!) 104  Weight: 232 lb 6.4 oz (105.4 kg)  Body mass index is 38.09 kg/m.           Physical Examination:   General appearance:  alert, well appearing, and in no distress  Mental status: alert, oriented to person, place, and time  Skin: warm & dry   Extremities: Edema: Trace    Cardiovascular: normal heart rate noted  Respiratory: normal respiratory effort, no distress  Abdomen: gravid, soft, non-tender  Pelvic:  spec exam: cx visually long/closed, mod amt thin d/c          Fetal Status: Fetal Heart Rate (bpm): 150 Fundal Height: 26 cm Movement: Present    Fetal Surveillance Testing today: doppler   Chaperone: N/A    Results for orders placed or performed in visit on 04/28/21 (from the past 24 hour(s))  CMP14+EGFR   Collection Time: 04/28/21  3:32 PM  Result Value Ref Range   Glucose 78 70 - 99 mg/dL   BUN 9 6 - 20 mg/dL   Creatinine, Ser 0.68 0.57 - 1.00 mg/dL   eGFR 124 >59 mL/min/1.73   BUN/Creatinine Ratio 13 9 - 23   Sodium 137 134 - 144 mmol/L   Potassium 4.2 3.5 - 5.2 mmol/L   Chloride 104 96 - 106 mmol/L   CO2 20 20 - 29 mmol/L   Calcium 8.6 (L) 8.7 - 10.2 mg/dL   Total Protein 6.5 6.0 - 8.5 g/dL   Albumin 3.4 (L) 3.9 - 5.0 g/dL   Globulin, Total 3.1 1.5 - 4.5 g/dL   Albumin/Globulin Ratio 1.1 (L) 1.2 -  2.2   Bilirubin Total <0.2 0.0 - 1.2 mg/dL   Alkaline Phosphatase 57 44 - 121 IU/L   AST 15 0 - 40 IU/L   ALT 16 0 - 32 IU/L  CBC With Differential   Collection Time: 04/28/21  3:32 PM  Result Value Ref Range   WBC 6.7 3.4 - 10.8 x10E3/uL   RBC 3.65 (L) 3.77 - 5.28 x10E6/uL   Hemoglobin 10.3 (L) 11.1 - 15.9 g/dL   Hematocrit 30.6 (L) 34.0 - 46.6 %   MCV 84 79 - 97 fL   MCH 28.2 26.6 - 33.0 pg   MCHC 33.7 31.5 - 35.7 g/dL   RDW 11.8 11.7 - 15.4 %   Neutrophils 65 Not Estab. %   Lymphs 23 Not Estab. %   Monocytes 7 Not Estab. %   Eos 2 Not Estab. %   Basos 1 Not Estab. %   Neutrophils Absolute 4.4 1.4 - 7.0 x10E3/uL   Lymphocytes Absolute 1.5 0.7 - 3.1 x10E3/uL   Monocytes Absolute 0.5 0.1 - 0.9 x10E3/uL   EOS (ABSOLUTE) 0.2 0.0 - 0.4 x10E3/uL   Basophils Absolute 0.0 0.0 - 0.2  x10E3/uL   Immature Granulocytes 2 Not Estab. %   Immature Grans (Abs) 0.1 0.0 - 0.1 x10E3/uL  Alpha-Gal Panel   Collection Time: 04/28/21  3:32 PM  Result Value Ref Range   IgE (Immunoglobulin E), Serum WILL FOLLOW    O215-IgE Alpha-Gal WILL FOLLOW    Beef IgE WILL FOLLOW    Pork IgE WILL FOLLOW    Allergen Lamb IgE WILL FOLLOW   Chronic Urticaria   Collection Time: 04/28/21  3:32 PM  Result Value Ref Range   cu index WILL FOLLOW   Tryptase   Collection Time: 04/28/21  3:32 PM  Result Value Ref Range   Tryptase WILL FOLLOW   Allergens w/Comp Rflx Area 2   Collection Time: 04/28/21  3:32 PM  Result Value Ref Range   D Pteronyssinus IgE WILL FOLLOW    D Farinae IgE WILL FOLLOW    E001-IgE Cat Dander WILL FOLLOW    E005-IgE Dog Dander WILL FOLLOW    [place] Grass IgE WILL FOLLOW    Timothy Grass IgE WILL FOLLOW    Johnson Grass IgE WILL FOLLOW    Cockroach, German IgE WILL FOLLOW    Penicillium Chrysogen IgE WILL FOLLOW    Cladosporium Herbarum IgE WILL FOLLOW    Aspergillus Fumigatus IgE WILL FOLLOW    Alternaria Alternata IgE WILL FOLLOW    Maple/Box Elder IgE WILL FOLLOW    Common Silver Wendee Copp IgE WILL FOLLOW    [place], Mountain IgE WILL FOLLOW    Oak, White IgE WILL FOLLOW    [place], American IgE WILL FOLLOW    [place] IgE WILL FOLLOW    Pecan, Hickory IgE WILL FOLLOW    White Mulberry IgE WILL FOLLOW    Ragweed, Short IgE WILL FOLLOW    Pigweed, Rough IgE WILL FOLLOW    Sheep Sorrel IgE Qn WILL FOLLOW    Mouse Urine IgE WILL FOLLOW   IgE Nut Prof. w/Component Rflx   Collection Time: 04/28/21  3:32 PM  Result Value Ref Range   Class Description Allergens WILL FOLLOW    F017-IgE Hazelnut (Filbert) WILL FOLLOW    F256-IgE Walnut WILL FOLLOW    F202-IgE Cashew Nut WILL FOLLOW    F018-IgE Bolivia Nut WILL FOLLOW    Peanut, IgE WILL FOLLOW    Macadamia Nut, IgE WILL FOLLOW  Pecan Nut IgE WILL FOLLOW   ° F203-IgE Pistachio Nut WILL FOLLOW   ° F020-IgE Almond WILL  FOLLOW   °Allergen Profile, Basic Food  ° Collection Time: 04/28/21  3:32 PM  °Result Value Ref Range  ° Milk IgE WILL FOLLOW   ° Wheat IgE WILL FOLLOW   ° Allergen Corn, IgE WILL FOLLOW   ° Peanut IgE WILL FOLLOW   ° Soybean IgE WILL FOLLOW   ° Food Mix (Seafoods) IgE WILL FOLLOW   ° Egg, Whole IgE WILL FOLLOW   ° Chocolate/Cacao IgE WILL FOLLOW   °Allergy Panel 19, Seafood Group  ° Collection Time: 04/28/21  3:32 PM  °Result Value Ref Range  ° Codfish IgE WILL FOLLOW   ° F023-IgE Crab WILL FOLLOW   ° Shrimp IgE WILL FOLLOW   ° Tuna WILL FOLLOW   ° Allergen Salmon IgE WILL FOLLOW   ° F080-IgE Lobster WILL FOLLOW   ° Catfish WILL FOLLOW   °Allergen, Pineapple, f210  ° Collection Time: 04/28/21  3:32 PM  °Result Value Ref Range  ° Pineapple IgE WILL FOLLOW   °Sedimentation rate  ° Collection Time: 04/28/21  3:32 PM  °Result Value Ref Range  ° Sed Rate 28 0 - 32 mm/hr  °Thyroid antibodies  ° Collection Time: 04/28/21  3:32 PM  °Result Value Ref Range  ° Thyroperoxidase Ab SerPl-aCnc 14 0 - 34 IU/mL  ° Thyroglobulin Antibody WILL FOLLOW   °Alpha-1-antitrypsin  ° Collection Time: 04/28/21  3:32 PM  °Result Value Ref Range  ° A-1 Antitrypsin 208 (H) 100 - 188 mg/dL  °C-reactive protein  ° Collection Time: 04/28/21  3:32 PM  °Result Value Ref Range  ° CRP 4 0 - 10 mg/L  °ANA, IFA (with reflex)  ° Collection Time: 04/28/21  3:32 PM  °Result Value Ref Range  ° ANA Titer 1 WILL FOLLOW   °  °Assessment & Plan:  °High-risk pregnancy: G1P0000 at [redacted]w[redacted]d with an Estimated Date of Delivery: 08/06/21  ° °1) CHTN, stable, no meds ° °2) Vaginal d/c w/ odor/itching/irritation, CV swab ° °Meds: No orders of the defined types were placed in this encounter. ° ° °Labs/procedures today: spec exam and CV swab ° °Treatment Plan:  Growth u/s q4wks     no antenatal testing   Deliver @ 38-39.6wks:______  ° °Reviewed: Preterm labor symptoms and general obstetric precautions including but not limited to vaginal bleeding, contractions, leaking of  fluid and fetal movement were reviewed in detail with the patient.  All questions were answered. Does have home bp cuff. Office bp cuff given: not applicable. Check bp weekly, let us know if consistently >150 and/or >95. ° °Follow-up: Return for As scheduled; needs efw u/s at that appt please. ° ° °Future Appointments  °Date Time Provider Department Center  °05/20/2021  8:30 AM [facility]  °05/20/2021  8:50 AM Booker, Kimberly R, CNM [facility]  °05/26/2021 10:30 AM Gallagher, Joel Louis, MD [facility] None  ° ° °No orders of the defined types were placed in this encounter. ° °Kimberly R Booker CNM, WHNP-BC °04/29/2021 °2:39 PM  °

## 2021-04-29 NOTE — Patient Instructions (Signed)
Roberta Guerrero, thank you for choosing our office today! We appreciate the opportunity to meet your healthcare needs. You may receive a short survey by mail, e-mail, or through MyChart. If you are happy with your care we would appreciate if you could take just a few minutes to complete the survey questions. We read all of your comments and take your feedback very seriously. Thank you again for choosing our office.  [facility] ([address] [place], [place] [address]) Entrance C, located off of [address] Free 24/7 valet parking   You will have your sugar test next visit.  Please do not eat or drink anything after midnight the night before you come, not even water.  You will be here for at least two hours.  Please make an appointment online for the bloodwork at [email or web address] for 8:00am (or as close to this as possible). Make sure you select the [address] service center.   CLASSES: Go to [email or web address] to register for classes (childbirth, breastfeeding, waterbirth, infant CPR, daddy bootcamp, etc.)  Call the office ([phone]) or go to [facility] if: You begin to have strong, frequent contractions Your water breaks.  Sometimes it is a big gush of fluid, sometimes it is just a trickle that keeps getting your panties wet or running down your legs You have vaginal bleeding.  It is normal to have a small amount of spotting if your cervix was checked.  You don't feel your baby moving like normal.  If you don't, get you something to eat and drink and lay down and focus on feeling your baby move.   If your baby is still not moving like normal, you should call the office or go to [facility].  Call the office ([phone]) or go to [facility] for these signs of pre-eclampsia: Severe headache that does not go away with Tylenol Visual changes- seeing spots, double, blurred vision Pain under your right breast or upper  abdomen that does not go away with Tums or heartburn medicine Nausea and/or vomiting Severe swelling in your hands, feet, and face    [place] [facility] ([facility]): [address], [phone]           [facility]: [address], [phone]                [facility] ([facility]): [address], [phone] (call to ask if accepting patients) [facility]: [address], [place], [phone]    [place] [facility] ([facility]): [address], [phone] [facility]: [address], [phone] [facility]: [address], [phone]  [place] [facility]  [facility] ([facility]): [phone] [facility]: [address], [phone]   [place] [facility]: [address], [phone]  [facility]  [facility]: [address], [phone]  Home Blood Pressure Monitoring for Patients   Your provider has recommended that you check your blood pressure (BP) at least once a week at home. If you do not have a blood pressure cuff at home, one will be provided for you. Contact your provider if you have not received your monitor within 1 week.   Helpful Tips for Accurate Home Blood Pressure Checks  Don't smoke, exercise, or drink   caffeine 30 minutes before checking your BP °Use the restroom before checking your BP (a full bladder can raise your pressure) °Relax in a comfortable upright chair °Feet on the ground °Left arm resting comfortably on a flat surface at the level of your heart °Legs uncrossed °Back supported °Sit quietly and don't talk °Place the cuff on your bare arm °Adjust snuggly, so that only two fingertips can fit between your skin and the top of the cuff °Check 2  readings separated by at least one minute °Keep a log of your BP readings °For a visual, please reference this diagram: http://ccnc.care/bpdiagram ° °Provider Name: [facility]     Phone: [phone] ° °Zone 1: ALL CLEAR  °Continue to monitor your symptoms:  °BP reading is less than 140 (top number) or less than 90 (bottom number)  °No right upper stomach pain °No headaches or seeing spots °No feeling nauseated or throwing up °No swelling in face and hands ° °Zone 2: CAUTION °Call your doctor's office for any of the following:  °BP reading is greater than 140 (top number) or greater than 90 (bottom number)  °Stomach pain under your ribs in the middle or right side °Headaches or seeing spots °Feeling nauseated or throwing up °Swelling in face and hands ° °Zone 3: EMERGENCY  °Seek immediate medical care if you have any of the following:  °BP reading is greater than160 (top number) or greater than 110 (bottom number) °Severe headaches not improving with Tylenol °Serious difficulty catching your breath °Any worsening symptoms from Zone 2  ° °Second Trimester of Pregnancy °The second trimester is from week 13 through week 28, months 4 through 6. The second trimester is often a time when you feel your best. Your body has also adjusted to being pregnant, and you begin to feel better physically. Usually, morning sickness has lessened or quit completely, you may have more energy, and you may have an increase in appetite. The second trimester is also a time when the fetus is growing rapidly. At the end of the sixth month, the fetus is about 9 inches long and weighs about 1½ pounds. You will likely begin to feel the baby move (quickening) between 18 and 20 weeks of the pregnancy. °BODY CHANGES °Your body goes through many changes during pregnancy. The changes vary from woman to woman.  °Your weight will continue to increase. You will notice your lower abdomen bulging out. °You may begin to get stretch marks on your  hips, abdomen, and breasts. °You may develop headaches that can be relieved by medicines approved by your health care provider. °You may urinate more often because the fetus is pressing on your bladder. °You may develop or continue to have heartburn as a result of your pregnancy. °You may develop constipation because certain hormones are causing the muscles that push waste through your intestines to slow down. °You may develop hemorrhoids or swollen, bulging veins (varicose veins). °You may have back pain because of the weight gain and pregnancy hormones relaxing your joints between the bones in your pelvis and as a result of a shift in weight and the muscles that support your balance. °Your breasts will continue to grow and be tender. °Your gums may bleed and may be sensitive to brushing and flossing. °Dark spots or blotches (chloasma, mask of pregnancy) may develop on your face. This will likely fade after the baby is born. °A dark line from your belly button to the pubic area (linea nigra) may appear. This   will likely fade after the baby is born. You may have changes in your hair. These can include thickening of your hair, rapid growth, and changes in texture. Some women also have hair loss during or after pregnancy, or hair that feels dry or thin. Your hair will most likely return to normal after your baby is born. WHAT TO EXPECT AT YOUR PRENATAL VISITS During a routine prenatal visit: You will be weighed to make sure you and the fetus are growing normally. Your blood pressure will be taken. Your abdomen will be measured to track your baby's growth. The fetal heartbeat will be listened to. Any test results from the previous visit will be discussed. Your health care provider may ask you: How you are feeling. If you are feeling the baby move. If you have had any abnormal symptoms, such as leaking fluid, bleeding, severe headaches, or abdominal cramping. If you have any questions. Other tests that may  be performed during your second trimester include: Blood tests that check for: Low iron levels (anemia). Gestational diabetes (between 24 and 28 weeks). Rh antibodies. Urine tests to check for infections, diabetes, or protein in the urine. An ultrasound to confirm the proper growth and development of the baby. An amniocentesis to check for possible genetic problems. Fetal screens for spina bifida and Down syndrome. HOME CARE INSTRUCTIONS  Avoid all smoking, herbs, alcohol, and unprescribed drugs. These chemicals affect the formation and growth of the baby. Follow your health care provider's instructions regarding medicine use. There are medicines that are either safe or unsafe to take during pregnancy. Exercise only as directed by your health care provider. Experiencing uterine cramps is a good sign to stop exercising. Continue to eat regular, healthy meals. Wear a good support bra for breast tenderness. Do not use hot tubs, steam rooms, or saunas. Wear your seat belt at all times when driving. Avoid raw meat, uncooked cheese, cat litter boxes, and soil used by cats. These carry germs that can cause birth defects in the baby. Take your prenatal vitamins. Try taking a stool softener (if your health care provider approves) if you develop constipation. Eat more high-fiber foods, such as fresh vegetables or fruit and whole grains. Drink plenty of fluids to keep your urine clear or pale yellow. Take warm sitz baths to soothe any pain or discomfort caused by hemorrhoids. Use hemorrhoid cream if your health care provider approves. If you develop varicose veins, wear support hose. Elevate your feet for 15 minutes, 3-4 times a day. Limit salt in your diet. Avoid heavy lifting, wear low heel shoes, and practice good posture. Rest with your legs elevated if you have leg cramps or low back pain. Visit your dentist if you have not gone yet during your pregnancy. Use a soft toothbrush to brush your teeth  and be gentle when you floss. A sexual relationship may be continued unless your health care provider directs you otherwise. Continue to go to all your prenatal visits as directed by your health care provider. SEEK MEDICAL CARE IF:  You have dizziness. You have mild pelvic cramps, pelvic pressure, or nagging pain in the abdominal area. You have persistent nausea, vomiting, or diarrhea. You have a bad smelling vaginal discharge. You have pain with urination. SEEK IMMEDIATE MEDICAL CARE IF:  You have a fever. You are leaking fluid from your vagina. You have spotting or bleeding from your vagina. You have severe abdominal cramping or pain. You have rapid weight gain or loss. You have shortness of  breath with chest pain. °You notice sudden or extreme swelling of your face, hands, ankles, feet, or legs. °You have not felt your baby move in over an hour. °You have severe headaches that do not go away with medicine. °You have vision changes. °Document Released: 03/24/2001 Document Revised: 04/04/2013 Document Reviewed: 05/31/2012 °ExitCare® Patient Information ©2015 [employer]. This information is not intended to replace advice given to you by your health care provider. Make sure you discuss any questions you have with your health care provider. ° °

## 2021-05-01 LAB — CERVICOVAGINAL ANCILLARY ONLY
Bacterial Vaginitis (gardnerella): POSITIVE — AB
Candida Glabrata: NEGATIVE
Candida Vaginitis: NEGATIVE
Chlamydia: NEGATIVE
Comment: NEGATIVE
Comment: NEGATIVE
Comment: NEGATIVE
Comment: NEGATIVE
Comment: NEGATIVE
Comment: NORMAL
Neisseria Gonorrhea: NEGATIVE
Trichomonas: NEGATIVE

## 2021-05-01 MED ORDER — METRONIDAZOLE 500 MG PO TABS: 500.0000 mg | ORAL_TABLET | Freq: Two times a day (BID) | ORAL | 0 refills | Status: DC

## 2021-05-01 NOTE — Addendum Note (Signed)
Addended by: Cheral Marker on: 05/01/2021 04:51 PM   Modules accepted: Orders

## 2021-05-07 LAB — PEANUT COMPONENTS
F352-IgE Ara h 8: <0.1 kU/L
F422-IgE Ara h 1: <0.1 kU/L
F423-IgE Ara h 2: <0.1 kU/L
F424-IgE Ara h 3: <0.1 kU/L
F427-IgE Ara h 9: <0.1 kU/L
F447-IgE Ara h 6: <0.1 kU/L

## 2021-05-07 LAB — CBC WITH DIFFERENTIAL
Basophils Absolute: 0 10*3/uL (ref 0.0–0.2)
Basos: 1 %
EOS (ABSOLUTE): 0.2 10*3/uL (ref 0.0–0.4)
Eos: 2 %
Hematocrit: 30.6 % — ABNORMAL LOW (ref 34.0–46.6)
Hemoglobin: 10.3 g/dL — ABNORMAL LOW (ref 11.1–15.9)
Immature Grans (Abs): 0.1 10*3/uL (ref 0.0–0.1)
Immature Granulocytes: 2 %
Lymphocytes Absolute: 1.5 10*3/uL (ref 0.7–3.1)
Lymphs: 23 %
MCH: 28.2 pg (ref 26.6–33.0)
MCHC: 33.7 g/dL (ref 31.5–35.7)
MCV: 84 fL (ref 79–97)
Monocytes Absolute: 0.5 10*3/uL (ref 0.1–0.9)
Monocytes: 7 %
Neutrophils Absolute: 4.4 10*3/uL (ref 1.4–7.0)
Neutrophils: 65 %
RBC: 3.65 x10E6/uL — ABNORMAL LOW (ref 3.77–5.28)
RDW: 11.8 % (ref 11.7–15.4)
WBC: 6.7 10*3/uL (ref 3.4–10.8)

## 2021-05-07 LAB — ALLERGENS W/COMP RFLX AREA 2
Alternaria Alternata IgE: 2.5 kU/L — AB
Aspergillus Fumigatus IgE: 2.21 kU/L — AB
Bermuda Grass IgE: 1.72 kU/L — AB
Cedar, Mountain IgE: 1.46 kU/L — AB
Cladosporium Herbarum IgE: 1.33 kU/L — AB
Cockroach, German IgE: 0.19 kU/L — AB
Common Silver Birch IgE: 2.42 kU/L — AB
Cottonwood IgE: 1.46 kU/L — AB
D Farinae IgE: 1.01 kU/L — AB
D Pteronyssinus IgE: 1.12 kU/L — AB
E001-IgE Cat Dander: <0.1 kU/L
E005-IgE Dog Dander: 0.9 kU/L — AB
Elm, American IgE: 3.54 kU/L — AB
Johnson Grass IgE: 1.47 kU/L — AB
Maple/Box Elder IgE: 1.37 kU/L — AB
Mouse Urine IgE: <0.1 kU/L
Oak, White IgE: 2.01 kU/L — AB
Pecan, Hickory IgE: 0.73 kU/L — AB
Penicillium Chrysogen IgE: 0.24 kU/L — AB
Pigweed, Rough IgE: 1.43 kU/L — AB
Ragweed, Short IgE: 2.08 kU/L — AB
Sheep Sorrel IgE Qn: 1.07 kU/L — AB
Timothy Grass IgE: 5.08 kU/L — AB
White Mulberry IgE: <0.1 kU/L

## 2021-05-07 LAB — CMP14+EGFR
ALT: 16 IU/L (ref 0–32)
AST: 15 IU/L (ref 0–40)
Albumin/Globulin Ratio: 1.1 — ABNORMAL LOW (ref 1.2–2.2)
Albumin: 3.4 g/dL — ABNORMAL LOW (ref 3.9–5.0)
Alkaline Phosphatase: 57 IU/L (ref 44–121)
BUN/Creatinine Ratio: 13 (ref 9–23)
BUN: 9 mg/dL (ref 6–20)
Bilirubin Total: <0.2 mg/dL (ref 0.0–1.2)
CO2: 20 mmol/L (ref 20–29)
Calcium: 8.6 mg/dL — ABNORMAL LOW (ref 8.7–10.2)
Chloride: 104 mmol/L (ref 96–106)
Creatinine, Ser: 0.68 mg/dL (ref 0.57–1.00)
Globulin, Total: 3.1 g/dL (ref 1.5–4.5)
Glucose: 78 mg/dL (ref 70–99)
Potassium: 4.2 mmol/L (ref 3.5–5.2)
Sodium: 137 mmol/L (ref 134–144)
Total Protein: 6.5 g/dL (ref 6.0–8.5)
eGFR: 124 mL/min/{1.73_m2} (ref >59)

## 2021-05-07 LAB — ALPHA-GAL PANEL
Allergen Lamb IgE: <0.1 kU/L
Beef IgE: <0.1 kU/L
IgE (Immunoglobulin E), Serum: 124 IU/mL (ref 6–495)
O215-IgE Alpha-Gal: <0.1 kU/L
Pork IgE: <0.1 kU/L

## 2021-05-07 LAB — PANEL 604726
Cor A 1 IgE: <0.1 kU/L
Cor A 14 IgE: <0.1 kU/L
Cor A 8 IgE: <0.1 kU/L
Cor A 9 IgE: <0.1 kU/L

## 2021-05-07 LAB — ALLERGEN PROFILE, BASIC FOOD
Allergen Corn, IgE: 1.08 kU/L — AB
Chocolate/Cacao IgE: <0.1 kU/L
Egg, Whole IgE: <0.1 kU/L
Food Mix (Seafoods) IgE: NEGATIVE
Milk IgE: <0.1 kU/L
Soybean IgE: 0.25 kU/L — AB
Wheat IgE: 0.76 kU/L — AB

## 2021-05-07 LAB — THYROID ANTIBODIES
Thyroglobulin Antibody: <1 IU/mL (ref 0.0–0.9)
Thyroperoxidase Ab SerPl-aCnc: 14 IU/mL (ref 0–34)

## 2021-05-07 LAB — IGE NUT PROF. W/COMPONENT RFLX
F017-IgE Hazelnut (Filbert): 0.14 kU/L — AB
F018-IgE Brazil Nut: <0.1 kU/L
F020-IgE Almond: <0.1 kU/L
F202-IgE Cashew Nut: <0.1 kU/L
F203-IgE Pistachio Nut: 0.54 kU/L — AB
F256-IgE Walnut: <0.1 kU/L
Macadamia Nut, IgE: 0.14 kU/L — AB
Peanut, IgE: 0.43 kU/L — AB
Pecan Nut IgE: <0.1 kU/L

## 2021-05-07 LAB — PANEL 606648
E101-IgE Can f 1: 0.75 kU/L — AB
E102-IgE Can f 2: 0.11 kU/L — AB
E221-IgE Can f 3: <0.1 kU/L
E226-IgE Can f 5: <0.1 kU/L

## 2021-05-07 LAB — ANTINUCLEAR ANTIBODIES, IFA: ANA Titer 1: NEGATIVE

## 2021-05-07 LAB — ALLERGEN, PINEAPPLE, F210: Pineapple IgE: <0.1 kU/L

## 2021-05-07 LAB — CHRONIC URTICARIA: cu index: 3.5 (ref <10)

## 2021-05-07 LAB — ALLERGY PANEL 19, SEAFOOD GROUP
Allergen Salmon IgE: <0.1 kU/L
Catfish: <0.1 kU/L
Codfish IgE: <0.1 kU/L
F023-IgE Crab: <0.1 kU/L
F080-IgE Lobster: <0.1 kU/L
Shrimp IgE: <0.1 kU/L
Tuna: <0.1 kU/L

## 2021-05-07 LAB — TRYPTASE: Tryptase: 3.6 ug/L (ref 2.2–13.2)

## 2021-05-07 LAB — ALLERGEN COMPONENT COMMENTS

## 2021-05-07 LAB — C-REACTIVE PROTEIN: CRP: 4 mg/L (ref 0–10)

## 2021-05-07 LAB — SEDIMENTATION RATE: Sed Rate: 28 mm/hr (ref 0–32)

## 2021-05-07 LAB — ALPHA-1-ANTITRYPSIN: A-1 Antitrypsin: 208 mg/dL — ABNORMAL HIGH (ref 100–188)

## 2021-05-08 ENCOUNTER — Other Ambulatory Visit: Payer: Medicaid Other

## 2021-05-09 ENCOUNTER — Other Ambulatory Visit: Payer: Self-pay

## 2021-05-09 ENCOUNTER — Ambulatory Visit
Admission: EM | Admit: 2021-05-09 | Discharge: 2021-05-09 | Disposition: A | Discharge status: Home / Self Care | Payer: Medicaid Other | Attending: Emergency Medicine | Admitting: Emergency Medicine

## 2021-05-09 DIAGNOSIS — J988 Other specified respiratory disorders: Secondary | ICD-10-CM | POA: Diagnosis not present

## 2021-05-09 DIAGNOSIS — J029 Acute pharyngitis, unspecified: Secondary | ICD-10-CM | POA: Insufficient documentation

## 2021-05-09 DIAGNOSIS — J45901 Unspecified asthma with (acute) exacerbation: Secondary | ICD-10-CM | POA: Diagnosis not present

## 2021-05-09 DIAGNOSIS — Z20828 Contact with and (suspected) exposure to other viral communicable diseases: Secondary | ICD-10-CM | POA: Insufficient documentation

## 2021-05-09 DIAGNOSIS — B9789 Other viral agents as the cause of diseases classified elsewhere: Secondary | ICD-10-CM | POA: Diagnosis present

## 2021-05-09 LAB — POCT RAPID STREP A (OFFICE): Rapid Strep A Screen: NEGATIVE

## 2021-05-09 MED ORDER — FLUTICASONE PROPIONATE 50 MCG/ACT NA SUSP: 2.0000 | Freq: Every day | NASAL | 0 refills | Status: DC

## 2021-05-09 MED ORDER — PREDNISONE 50 MG PO TABS
50.0000 mg | ORAL_TABLET | Freq: Every day | ORAL | 0 refills | Status: AC
Start: 2021-05-09 — End: 2021-05-14

## 2021-05-09 MED ORDER — OSELTAMIVIR PHOSPHATE 75 MG PO CAPS: 75.0000 mg | ORAL_CAPSULE | Freq: Two times a day (BID) | ORAL | 0 refills | Status: DC

## 2021-05-09 MED ORDER — AEROCHAMBER PLUS MISC: 2 refills | Status: DC

## 2021-05-09 NOTE — ED Provider Notes (Signed)
HPI  SUBJECTIVE:  Roberta Guerrero is a 26 y.o. female who presents with 3 days of sore throat, headache, nasal congestion, rhinorrhea, ear pain, postnasal drip, cough productive of brownish material, wheezing, shortness of breath.  No fevers, loss of sense of smell or taste, nausea, vomiting, diarrhea, abdominal pain.  No sensation of throat swelling shut, muffled voice, neck stiffness, drooling, trismus, difficulty breathing.  No known COVID, flu, strep exposure, but she works in a pharmacy.  She did not get the COVID-vaccine.  She got this years flu vaccine.  No antibiotics in the past month.  No antipyretic in the past 6 hours.  Symptoms are worse with swallowing and talking, no alleviating factors.  She has not tried anything for this.  She has a past medical history of asthma, but has not been using her albuterol inhaler.  She is 7 months pregnant.  She is receiving prenatal care.  She denies contractions, vaginal bleeding, leakage of fluid.  [facility]MD: [facility]  Past Medical History:  Diagnosis Date   Allergic rhinitis    Asthma    Headache    History of chlamydia    Hypertension    Menstrual migraine     Past Surgical History:  Procedure Laterality Date   CHOLECYSTECTOMY     MOUTH SURGERY     SKIN BIOPSY      Family History  Problem Relation Age of Onset   Hypertension Mother    Hypertension Father    Hyperlipidemia Father    Autism Brother    Autism Brother    Hypertension Maternal Uncle    Hypertension Maternal Grandmother    Alcohol abuse Maternal Grandfather     Social History   Tobacco Use   Smoking status: Former    Types: Cigars   Smokeless tobacco: Never  Vaping Use   Vaping Use: Never used  Substance Use Topics   Alcohol use: Not Currently    Comment: occasional   Drug use: Not Currently    Types: Marijuana    No current facility-administered medications for this encounter.  Current Outpatient Medications:    fluticasone (FLONASE) 50 MCG/ACT nasal  spray, Place 2 sprays into both nostrils daily., Disp: 16 g, Rfl: 0   oseltamivir (TAMIFLU) 75 MG capsule, Take 1 capsule (75 mg total) by mouth 2 (two) times daily. X 5 days, Disp: 10 capsule, Rfl: 0   predniSONE (DELTASONE) 50 MG tablet, Take 1 tablet (50 mg total) by mouth daily with breakfast for 5 days., Disp: 5 tablet, Rfl: 0   Spacer/Aero-Holding Chambers (AEROCHAMBER PLUS) inhaler, Use with inhaler, Disp: 1 each, Rfl: 2   albuterol (VENTOLIN HFA) 108 (90 Base) MCG/ACT inhaler, Inhale 2 puffs into the lungs every 6 (six) hours as needed for wheezing or shortness of breath., Disp: 8 g, Rfl: 2   aspirin EC 81 MG tablet, Take 2 tablets (162 mg total) by mouth daily., Disp: 60 tablet, Rfl: 6   budesonide (PULMICORT FLEXHALER) 180 MCG/ACT inhaler, Inhale 1 puff into the lungs in the morning and at bedtime., Disp: 1 each, Rfl: 5   triamcinolone ointment (KENALOG) 0.1 %, Apply 1 application topically 2 (two) times daily., Disp: 454 g, Rfl: 1  Allergies  Allergen Reactions   Pineapple Other (See Comments)    Makes tongue swell   Corn-Containing Products Rash   Other Rash    Nuts     ROS  As noted in HPI.   Physical Exam  BP 134/85 (BP Location: Right Arm)  Pulse (!) 104    Temp 98.7 F (37.1 C) (Oral)    Resp 18    LMP 10/30/2020 (Exact Date)    SpO2 98%   Constitutional: Well developed, well nourished, no acute distress Eyes:  EOMI, conjunctiva normal bilaterally HENT: Normocephalic, atraumatic,mucus membranes moist.  Positive nasal congestion.  Erythematous, swollen tonsils without exudates.  Uvula midline.  Eyes postnasal drip. Neck: Positive cervical lymphadenopathy Respiratory: Normal inspiratory effort, lungs clear bilaterally Cardiovascular: Tachycardia, no murmurs, rubs, gallops GI: nondistended, soft, no splenomegaly.  Fundus height consistent with dates. skin: No rash, skin intact Musculoskeletal: no deformities Neurologic: Alert & oriented x 3, no focal neuro  deficits Psychiatric: Speech and behavior appropriate   ED Course   Medications - No data to display  Orders Placed This Encounter  Procedures   Covid-19, Flu A+B ([facility])    Standing Status:   Standing    Number of Occurrences:   1   Culture, group A strep    Standing Status:   Standing    Number of Occurrences:   1   POCT rapid strep A    Standing Status:   Standing    Number of Occurrences:   1    Results for orders placed or performed during the hospital encounter of 05/09/21 (from the past 24 hour(s))  POCT rapid strep A     Status: None   Collection Time: 05/09/21  3:55 PM  Result Value Ref Range   Rapid Strep A Screen Negative Negative   No results found.  ED Clinical Impression  1. Acute pharyngitis, unspecified etiology   2. Exposure to the flu   3. Viral respiratory infection   4. Asthma with acute exacerbation, unspecified asthma severity, unspecified whether persistent      ED Assessment/Plan  Suspect influenza and asthma exacerbation.  COVID, flu sent.  Rapid strep negative.  Sending throat culture.  Will start on Tamiflu because she will be out of the window by the time the test is resulted, she is to discontinue if her flu is negative.  In the meantime, regularly scheduled albuterol for the next 4 days.  Prescribing spacer.  She does not need more albuterol.  Wait-and-see prescription of prednisone for asthma exacerbation.  She will check with OB/GYN prior to starting this, and will start this only if she is not improving with scheduled bronchodilators.  Flonase, saline nasal irrigation, Tylenol.  Work note.  Discussed labs, MDM, treatment plan, and plan for follow-up with patient. patient agrees with plan.   Meds ordered this encounter  Medications   Spacer/Aero-Holding Chambers (AEROCHAMBER PLUS) inhaler    Sig: Use with inhaler    Dispense:  1 each    Refill:  2    Please educate patient on use   predniSONE (DELTASONE) 50 MG tablet    Sig: Take 1  tablet (50 mg total) by mouth daily with breakfast for 5 days.    Dispense:  5 tablet    Refill:  0   fluticasone (FLONASE) 50 MCG/ACT nasal spray    Sig: Place 2 sprays into both nostrils daily.    Dispense:  16 g    Refill:  0   oseltamivir (TAMIFLU) 75 MG capsule    Sig: Take 1 capsule (75 mg total) by mouth 2 (two) times daily. X 5 days    Dispense:  10 capsule    Refill:  0      *This clinic note was created using [employer]. Therefore,  there may be occasional mistakes despite careful proofreading.  ?    Melynda Ripple, MD 05/10/21 [phone]

## 2021-05-09 NOTE — ED Triage Notes (Signed)
Patient states her throat has been hurting for 3 days  Patient states she woke up this morning with body aches, chills and her chest hurts  Denies Fever  Denies Meds

## 2021-05-09 NOTE — Discharge Instructions (Addendum)
2 puffs from your albuterol inhaler using your spacer every 4 hours for 2 days, then every 6 hours for 2 days, then as needed.  May back off on the albuterol if you start to feel better sooner.  Flonase, saline nasal irrigation with a Lloyd Huger Med rinse and distilled water as often as you want, 1000 mg Tylenol 3-4 times a day as needed for body aches, headaches, chills.  I am giving you prednisone if you do not respond to the albuterol.  Make sure you check with OB/GYN to make sure that it is okay to take.

## 2021-05-10 LAB — COVID-19, FLU A+B NAA
Influenza A, NAA: NOT DETECTED
Influenza B, NAA: NOT DETECTED
SARS-CoV-2, NAA: NOT DETECTED

## 2021-05-12 LAB — CULTURE, GROUP A STREP (THRC)

## 2021-05-20 ENCOUNTER — Encounter: Payer: Self-pay | Admitting: Women's Health

## 2021-05-20 ENCOUNTER — Other Ambulatory Visit: Payer: Self-pay

## 2021-05-20 ENCOUNTER — Other Ambulatory Visit: Payer: Medicaid Other

## 2021-05-20 ENCOUNTER — Ambulatory Visit (INDEPENDENT_AMBULATORY_CARE_PROVIDER_SITE_OTHER): Payer: Medicaid Other | Admitting: Women's Health

## 2021-05-20 VITALS — BP 134/83 | HR 82 | Wt 233.0 lb

## 2021-05-20 DIAGNOSIS — Z23 Encounter for immunization: Secondary | ICD-10-CM

## 2021-05-20 DIAGNOSIS — Z3A28 28 weeks gestation of pregnancy: Secondary | ICD-10-CM

## 2021-05-20 DIAGNOSIS — I10 Essential (primary) hypertension: Secondary | ICD-10-CM

## 2021-05-20 DIAGNOSIS — O0993 Supervision of high risk pregnancy, unspecified, third trimester: Secondary | ICD-10-CM

## 2021-05-20 DIAGNOSIS — Z131 Encounter for screening for diabetes mellitus: Secondary | ICD-10-CM

## 2021-05-20 LAB — POCT URINALYSIS DIPSTICK OB
Glucose, UA: NEGATIVE
Ketones, UA: NEGATIVE
Nitrite, UA: NEGATIVE
POC,PROTEIN,UA: NEGATIVE

## 2021-05-20 NOTE — Progress Notes (Signed)
HIGH-RISK PREGNANCY VISIT Patient name: Roberta Guerrero MRN [id number]  Date of birth: 07/21/1995 Chief Complaint:   High Risk Gestation (PN2 today; has a hemorrhoid)  History of Present Illness:   Roberta Guerrero is a 26 y.o. G60P0000 female at [redacted]w[redacted]d with an Estimated Date of Delivery: 08/06/21 being seen today for ongoing management of a high-risk pregnancy complicated by chronic hypertension currently on no meds.    Today she reports  hemorrhoids, some constipation, some lower abd pain/pressure-tried belt but too uncomfortable . Contractions: Not present. Vag. Bleeding: None.  Movement: Present. denies leaking of fluid.   Depression screen [facility] 2/9 05/20/2021 01/30/2021 06/06/2020 07/14/2016  Decreased Interest 0 0 0 0  Down, Depressed, Hopeless 0 0 0 0  PHQ - 2 Score 0 0 0 0  Altered sleeping 1 1 0 -  Tired, decreased energy 2 3 2  -  Change in appetite 0 2 0 -  Feeling bad or failure about yourself  0 0 1 -  Trouble concentrating 0 0 0 -  Moving slowly or fidgety/restless 0 0 0 -  Suicidal thoughts 0 0 0 -  PHQ-9 Score 3 6 3  -     GAD 7 : Generalized Anxiety Score 05/20/2021 01/30/2021 06/06/2020  Nervous, Anxious, on Edge 0 0 0  Control/stop worrying 0 0 0  Worry too much - different things 0 0 0  Trouble relaxing 0 0 0  Restless 0 0 0  Easily annoyed or irritable 0 0 0  Afraid - awful might happen 0 0 0  Total GAD 7 Score 0 0 0     Review of Systems:   Pertinent items are noted in HPI Denies abnormal vaginal discharge w/ itching/odor/irritation, headaches, visual changes, shortness of breath, chest pain, abdominal pain, severe nausea/vomiting, or problems with urination or bowel movements unless otherwise stated above. Pertinent History Reviewed:  Reviewed past medical,surgical, social, obstetrical and family history.  Reviewed problem list, medications and allergies. Physical Assessment:   Vitals:   05/20/21 0850  BP: 134/83  Pulse: 82  Weight: 233 lb (105.7 kg)  Body  mass index is 38.18 kg/m.           Physical Examination:   General appearance: alert, well appearing, and in no distress  Mental status: alert, oriented to person, place, and time  Skin: warm & dry   Extremities: Edema: None    Cardiovascular: normal heart rate noted  Respiratory: normal respiratory effort, no distress  Abdomen: gravid, soft, non-tender  Pelvic: Cervical exam deferred         Rectal: small non-thrombosed, but inflamed hemorrhoid  Fetal Status: Fetal Heart Rate (bpm): 138 Fundal Height: 28 cm Movement: Present    Fetal Surveillance Testing today: doppler   Chaperone: Levy Pupa    Results for orders placed or performed in visit on 05/20/21 (from the past 24 hour(s))  POC Urinalysis Dipstick OB   Collection Time: 05/20/21  8:58 AM  Result Value Ref Range   Color, UA     Clarity, UA     Glucose, UA Negative Negative   Bilirubin, UA     Ketones, UA neg    Spec Grav, UA     Blood, UA trace    pH, UA     POC,PROTEIN,UA Negative Negative, Trace, Small (1+), Moderate (2+), Large (3+), 4+   Urobilinogen, UA     Nitrite, UA neg    Leukocytes, UA Trace (A) Negative   Appearance  Odor      Assessment & Plan:  High-risk pregnancy: G1P0000 at [redacted]w[redacted]d with an Estimated Date of Delivery: 08/06/21   1) CHTN, stable, ASA, no meds- no efw u/s available until when she is scheduled on 2/20, last efw 68% @ 24.6wks  2) Hemorrhoids, try otc meds  3) Constipation> gave printed prevention/relief measures   4) Low abd pain/pressure> try tape  Meds: No orders of the defined types were placed in this encounter.   Labs/procedures today: tdap and PN2  Treatment Plan:   Growth u/s q4wks     no antenatal testing   Deliver @ 38-39.6wks:______   Reviewed: Preterm labor symptoms and general obstetric precautions including but not limited to vaginal bleeding, contractions, leaking of fluid and fetal movement were reviewed in detail with the patient.  All questions were  answered. Does have home bp cuff. Office bp cuff given: not applicable. Check bp weekly, let us know if consistently >150 and/or >95.  Follow-up: Return for add hrob w/ md/cnm to 2/20 appt.   Future Appointments  Date Time Provider [place]  05/26/2021 10:30 AM Valentina Shaggy, MD [facility] None  06/02/2021 10:30 AM [facility] [place] [facility] None    Orders Placed This Encounter  Procedures   US OB Follow Up   Tdap vaccine greater than or equal to 7yo IM   POC Urinalysis Dipstick OB   Roma Schanz  Attending Physician for the [facility] 05/20/2021 9:38 AM

## 2021-05-20 NOTE — Patient Instructions (Addendum)
Breean, thank you for choosing our office today! We appreciate the opportunity to meet your healthcare needs. You may receive a short survey by mail, e-mail, or through [employer]. If you are happy with your care we would appreciate if you could take just a few minutes to complete the survey questions. We read all of your comments and take your feedback very seriously. Thank you again for choosing our office.  Center for [facility] Team at [place] [facility]  [facility] ([address] [place], [place] [id number]) Entrance C, located off of [address] Free 24/7 valet parking   Constipation Drink plenty of fluid, preferably water, throughout the day Eat foods high in fiber such as fruits, vegetables, and grains Exercise, such as walking, is a good way to keep your bowels regular Drink warm fluids, especially warm prune juice, or decaf coffee Eat a 1/2 cup of real oatmeal (not instant), 1/2 cup applesauce, and 1/2-1 cup warm prune juice every day If needed, you may take Colace (docusate sodium) stool softener once or twice a day to help keep the stool soft. If you are pregnant, wait until you are out of your first trimester (12-14 weeks of pregnancy) If you still are having problems with constipation, you may take Miralax once daily as needed to help keep your bowels regular.  If you are pregnant, wait until you are out of your first trimester (12-14 weeks of pregnancy) Preparation H, Tucks pads for hemorrhoids    Kinesiology tape (can get from sporting goods store), google how to tape belly for pregnancy    CLASSES: Go to [email or web address] to register for classes (childbirth, breastfeeding, waterbirth, infant CPR, daddy bootcamp, etc.)  Call the office [phone]) or go to [facility] if: You begin to have strong, frequent contractions Your water breaks.  Sometimes it is a big gush of fluid, sometimes it is just a trickle that keeps getting your panties wet  or running down your legs You have vaginal bleeding.  It is normal to have a small amount of spotting if your cervix was checked.  You don't feel your baby moving like normal.  If you don't, get you something to eat and drink and lay down and focus on feeling your baby move.   If your baby is still not moving like normal, you should call the office or go to [facility].  Call the office [phone]) or go to [facility] for these signs of pre-eclampsia: Severe headache that does not go away with Tylenol Visual changes- seeing spots, double, blurred vision Pain under your right breast or upper abdomen that does not go away with Tums or heartburn medicine Nausea and/or vomiting Severe swelling in your hands, feet, and face   Tdap Vaccine It is recommended that you get the Tdap vaccine during the third trimester of EACH pregnancy to help protect your baby from getting pertussis (whooping cough) 27-36 weeks is the BEST time to do this so that you can pass the protection on to your baby. During pregnancy is better than after pregnancy, but if you are unable to get it during pregnancy it will be offered at the hospital.  You can get this vaccine with us, at the health department, your family doctor, or some local pharmacies Everyone who will be around your baby should also be up-to-date on their vaccines before the baby comes. Adults (who are not pregnant) only need 1 dose of Tdap during adulthood.   Roberta Guerrero  Pediatricians/Family Doctors [facility]): [address] Colette Ribas, [phone]           [facility]: [address], [phone]                [facility]): [address], [phone] (call to ask if accepting patients) [facility] Department: [address], [place], [phone]    [facility] Pediatricians/Family Doctors [facility]): [phone] [address],  [phone] [facility]: [address] [place], [phone] [facility] [place]: [address], [phone]  [facility] Doctors  [facility]): [phone] [facility]: [address], [phone]   [facility] Doctors [facility]: [address] [phone]  [facility] Doctors  [facility]: [phone], [phone]  Home Blood Pressure Monitoring for Patients   Your provider has recommended that you check your blood pressure (BP) at least once a week at home. If you do not have a blood pressure cuff at home, one will be provided for you. Contact your provider if you have not received your monitor within 1 week.   Helpful Tips for Accurate Home Blood Pressure Checks  Don't smoke, exercise, or drink caffeine 30 minutes before checking your BP Use the restroom before checking your BP (a full bladder can raise your pressure) Relax in a comfortable upright chair Feet on the ground Left arm resting comfortably on a flat surface at the level of your heart Legs uncrossed Back supported Sit quietly and don't talk Place the cuff on your bare arm Adjust snuggly, so that only two fingertips can fit between your skin and the top of the cuff Check 2 readings separated by at least one minute Keep a log of your BP readings For a visual, please reference this diagram: http://ccnc.care/bpdiagram  Provider Name: [facility]     Phone: [phone]  Zone 1: ALL CLEAR  Continue to monitor your symptoms:  BP reading is less than 140 (top number) or less than 90 (bottom number)  No right upper stomach pain No headaches or seeing spots No feeling nauseated or throwing up No swelling in face and hands  Zone 2: CAUTION Call your doctor's office for any of the following:  BP reading is greater than 140 (top number) or greater than 90 (bottom number)  Stomach pain  under your ribs in the middle or right side Headaches or seeing spots Feeling nauseated or throwing up Swelling in face and hands  Zone 3: EMERGENCY  Seek immediate medical care if you have any of the following:  BP reading is greater than160 (top number) or greater than 110 (bottom number) Severe headaches not improving with Tylenol Serious difficulty catching your breath Any worsening symptoms from Zone 2   Third Trimester of Pregnancy The third trimester is from week 29 through week 42, months 7 through 9. The third trimester is a time when the fetus is growing rapidly. At the end of the ninth month, the fetus is about 20 inches in length and weighs 6-10 pounds.  BODY CHANGES Your body goes through many changes during pregnancy. The changes vary from woman to woman.  Your weight will continue to increase. You can expect to gain 25-35 pounds (11-16 kg) by the end of the pregnancy. You may begin to get stretch marks on your hips, abdomen, and breasts. You may urinate more  often because the fetus is moving lower into your pelvis and pressing on your bladder. You may develop or continue to have heartburn as a result of your pregnancy. You may develop constipation because certain hormones are causing the muscles that push waste through your intestines to slow down. You may develop hemorrhoids or swollen, bulging veins (varicose veins). You may have pelvic pain because of the weight gain and pregnancy hormones relaxing your joints between the bones in your pelvis. Backaches may result from overexertion of the muscles supporting your posture. You may have changes in your hair. These can include thickening of your hair, rapid growth, and changes in texture. Some women also have hair loss during or after pregnancy, or hair that feels dry or thin. Your hair will most likely return to normal after your baby is born. Your breasts will continue to grow and be tender. A yellow discharge may leak from  your breasts called colostrum. Your belly button may stick out. You may feel short of breath because of your expanding uterus. You may notice the fetus "dropping," or moving lower in your abdomen. You may have a bloody mucus discharge. This usually occurs a few days to a week before labor begins. Your cervix becomes thin and soft (effaced) near your due date. WHAT TO EXPECT AT YOUR PRENATAL EXAMS  You will have prenatal exams every 2 weeks until week 36. Then, you will have weekly prenatal exams. During a routine prenatal visit: You will be weighed to make sure you and the fetus are growing normally. Your blood pressure is taken. Your abdomen will be measured to track your baby's growth. The fetal heartbeat will be listened to. Any test results from the previous visit will be discussed. You may have a cervical check near your due date to see if you have effaced. At around 36 weeks, your caregiver will check your cervix. At the same time, your caregiver will also perform a test on the secretions of the vaginal tissue. This test is to determine if a type of bacteria, Group B streptococcus, is present. Your caregiver will explain this further. Your caregiver may ask you: What your birth plan is. How you are feeling. If you are feeling the baby move. If you have had any abnormal symptoms, such as leaking fluid, bleeding, severe headaches, or abdominal cramping. If you have any questions. Other tests or screenings that may be performed during your third trimester include: Blood tests that check for low iron levels (anemia). Fetal testing to check the health, activity level, and growth of the fetus. Testing is done if you have certain medical conditions or if there are problems during the pregnancy. FALSE LABOR You may feel small, irregular contractions that eventually go away. These are called Braxton Hicks contractions, or false labor. Contractions may last for hours, days, or even weeks before  true labor sets in. If contractions come at regular intervals, intensify, or become painful, it is best to be seen by your caregiver.  SIGNS OF LABOR  Menstrual-like cramps. Contractions that are 5 minutes apart or less. Contractions that start on the top of the uterus and spread down to the lower abdomen and back. A sense of increased pelvic pressure or back pain. A watery or bloody mucus discharge that comes from the vagina. If you have any of these signs before the 37th week of pregnancy, call your caregiver right away. You need to go to the hospital to get checked immediately. HOME CARE INSTRUCTIONS  Avoid  all smoking, herbs, alcohol, and unprescribed drugs. These chemicals affect the formation and growth of the baby. Follow your caregiver's instructions regarding medicine use. There are medicines that are either safe or unsafe to take during pregnancy. Exercise only as directed by your caregiver. Experiencing uterine cramps is a good sign to stop exercising. Continue to eat regular, healthy meals. Wear a good support bra for breast tenderness. Do not use hot tubs, steam rooms, or saunas. Wear your seat belt at all times when driving. Avoid raw meat, uncooked cheese, cat litter boxes, and soil used by cats. These carry germs that can cause birth defects in the baby. Take your prenatal vitamins. Try taking a stool softener (if your caregiver approves) if you develop constipation. Eat more high-fiber foods, such as fresh vegetables or fruit and whole grains. Drink plenty of fluids to keep your urine clear or pale yellow. Take warm sitz baths to soothe any pain or discomfort caused by hemorrhoids. Use hemorrhoid cream if your caregiver approves. If you develop varicose veins, wear support hose. Elevate your feet for 15 minutes, 3-4 times a day. Limit salt in your diet. Avoid heavy lifting, wear low heal shoes, and practice good posture. Rest a lot with your legs elevated if you have leg cramps  or low back pain. Visit your dentist if you have not gone during your pregnancy. Use a soft toothbrush to brush your teeth and be gentle when you floss. A sexual relationship may be continued unless your caregiver directs you otherwise. Do not travel far distances unless it is absolutely necessary and only with the approval of your caregiver. Take prenatal classes to understand, practice, and ask questions about the labor and delivery. Make a trial run to the hospital. Pack your hospital bag. Prepare the baby's nursery. Continue to go to all your prenatal visits as directed by your caregiver. SEEK MEDICAL CARE IF: You are unsure if you are in labor or if your water has broken. You have dizziness. You have mild pelvic cramps, pelvic pressure, or nagging pain in your abdominal area. You have persistent nausea, vomiting, or diarrhea. You have a bad smelling vaginal discharge. You have pain with urination. SEEK IMMEDIATE MEDICAL CARE IF:  You have a fever. You are leaking fluid from your vagina. You have spotting or bleeding from your vagina. You have severe abdominal cramping or pain. You have rapid weight loss or gain. You have shortness of breath with chest pain. You notice sudden or extreme swelling of your face, hands, ankles, feet, or legs. You have not felt your baby move in over an hour. You have severe headaches that do not go away with medicine. You have vision changes. Document Released: 03/24/2001 Document Revised: 04/04/2013 Document Reviewed: 05/31/2012 [facility] Patient Information 2015 [place], [place]. This information is not intended to replace advice given to you by your health care provider. Make sure you discuss any questions you have with your health care provider.

## 2021-05-21 ENCOUNTER — Other Ambulatory Visit: Payer: Self-pay | Admitting: Women's Health

## 2021-05-21 LAB — CBC
Hematocrit: 31.2 % — ABNORMAL LOW (ref 34.0–46.6)
Hemoglobin: 10 g/dL — ABNORMAL LOW (ref 11.1–15.9)
MCH: 26.9 pg (ref 26.6–33.0)
MCHC: 32.1 g/dL (ref 31.5–35.7)
MCV: 84 fL (ref 79–97)
Platelets: 240 10*3/uL (ref 150–450)
RBC: 3.72 x10E6/uL — ABNORMAL LOW (ref 3.77–5.28)
RDW: 12.1 % (ref 11.7–15.4)
WBC: 6.3 10*3/uL (ref 3.4–10.8)

## 2021-05-21 LAB — GLUCOSE TOLERANCE, 2 HOURS W/ 1HR
Glucose, 1 hour: 94 mg/dL (ref 70–179)
Glucose, 2 hour: 64 mg/dL — ABNORMAL LOW (ref 70–152)
Glucose, Fasting: 77 mg/dL (ref 70–91)

## 2021-05-21 LAB — ANTIBODY SCREEN: Antibody Screen: NEGATIVE

## 2021-05-21 LAB — RPR: RPR Ser Ql: NONREACTIVE

## 2021-05-21 LAB — HIV ANTIBODY (ROUTINE TESTING W REFLEX): HIV Screen 4th Generation wRfx: NONREACTIVE

## 2021-05-21 MED ORDER — FERROUS SULFATE 325 (65 FE) MG PO TABS: 325.0000 mg | ORAL_TABLET | ORAL | 2 refills | Status: DC

## 2021-05-26 ENCOUNTER — Ambulatory Visit: Payer: Medicaid Other | Admitting: Allergy & Immunology

## 2021-06-02 ENCOUNTER — Ambulatory Visit (INDEPENDENT_AMBULATORY_CARE_PROVIDER_SITE_OTHER): Payer: Medicaid Other

## 2021-06-02 ENCOUNTER — Other Ambulatory Visit: Payer: Self-pay

## 2021-06-02 ENCOUNTER — Encounter: Payer: Self-pay | Admitting: Obstetrics & Gynecology

## 2021-06-02 ENCOUNTER — Ambulatory Visit (INDEPENDENT_AMBULATORY_CARE_PROVIDER_SITE_OTHER): Payer: Medicaid Other | Admitting: Obstetrics & Gynecology

## 2021-06-02 VITALS — BP 124/79 | HR 75 | Wt 233.6 lb

## 2021-06-02 DIAGNOSIS — I10 Essential (primary) hypertension: Secondary | ICD-10-CM

## 2021-06-02 DIAGNOSIS — O0993 Supervision of high risk pregnancy, unspecified, third trimester: Secondary | ICD-10-CM | POA: Diagnosis not present

## 2021-06-02 DIAGNOSIS — R8271 Bacteriuria: Secondary | ICD-10-CM

## 2021-06-02 NOTE — Progress Notes (Signed)
[place] 30+5 wks,cephalic,anterior placenta gr 1,AFI 16 cm,FHR 137 bpm,LVEICF 1.8 mm,EFW 1877 g 79%

## 2021-06-02 NOTE — Progress Notes (Signed)
HIGH-RISK PREGNANCY VISIT Patient name: Roberta Guerrero MRN [id number]  Date of birth: 07/17/1995 Chief Complaint:   Routine Prenatal Visit  History of Present Illness:   Roberta Guerrero is a 26 y.o. G31P0000 female at [redacted]w[redacted]d with an Estimated Date of Delivery: 08/06/21 being seen today for ongoing management of a high-risk pregnancy complicated by: Chronic HTN- no meds.    Today she reports no complaints.   Contractions: Not present. Vag. Bleeding: None.  Movement: Present. denies leaking of fluid.   Depression screen [facility] 2/9 05/20/2021 01/30/2021 06/06/2020 07/14/2016  Decreased Interest 0 0 0 0  Down, Depressed, Hopeless 0 0 0 0  PHQ - 2 Score 0 0 0 0  Altered sleeping 1 1 0 -  Tired, decreased energy 2 3 2  -  Change in appetite 0 2 0 -  Feeling bad or failure about yourself  0 0 1 -  Trouble concentrating 0 0 0 -  Moving slowly or fidgety/restless 0 0 0 -  Suicidal thoughts 0 0 0 -  PHQ-9 Score 3 6 3  -     Current Outpatient Medications  Medication Instructions   albuterol (VENTOLIN HFA) 108 (90 Base) MCG/ACT inhaler 2 puffs, Inhalation, Every 6 hours PRN   aspirin EC 162 mg, Oral, Daily   budesonide (PULMICORT FLEXHALER) 180 MCG/ACT inhaler 1 puff, Inhalation, 2 times daily   ferrous sulfate 325 mg, Oral, Every other day   Spacer/Aero-Holding Chambers (AEROCHAMBER PLUS) inhaler Use with inhaler   triamcinolone ointment (KENALOG) 0.1 % 1 application, Topical, 2 times daily     Review of Systems:   Pertinent items are noted in HPI Denies abnormal vaginal discharge w/ itching/odor/irritation, headaches, visual changes, shortness of breath, chest pain, abdominal pain, severe nausea/vomiting, or problems with urination or bowel movements unless otherwise stated above. Pertinent History Reviewed:  Reviewed past medical,surgical, social, obstetrical and family history.  Reviewed problem list, medications and allergies. Physical Assessment:   Vitals:   06/02/21 1121  BP: 124/79   Pulse: 75  Weight: 233 lb 9.6 oz (106 kg)  Body mass index is 38.28 kg/m.           Physical Examination:   General appearance: alert, well appearing, and in no distress  Mental status: normal mood, behavior, speech, dress, motor activity, and thought processes  Skin: warm & dry   Extremities: Edema: None    Cardiovascular: normal heart rate noted  Respiratory: normal respiratory effort, no distress  Abdomen: gravid, soft, non-tender  Pelvic: Cervical exam deferred         Fetal Status:     Movement: Present    Fetal Surveillance Testing today: [place] growth  [place] [id number] wks,cephalic,anterior placenta gr 1,AFI 16 cm,FHR 137 bpm,LVEICF 1.8 mm,EFW 1877 g 79%  Chaperone: N/A    No results found for this or any previous visit (from the past 24 hour(s)).   Assessment & Plan:  High-risk pregnancy: G1P0000 at [redacted]w[redacted]d with an Estimated Date of Delivery: 08/06/21   1) chronic HTN -continue growth q 4wks  2) GBS pos- PCN in labor  Continue routine visits  Meds: No orders of the defined types were placed in this encounter.   Labs/procedures today: growth scan  Treatment Plan:  as outlined above  Reviewed: Preterm labor symptoms and general obstetric precautions including but not limited to vaginal bleeding, contractions, leaking of fluid and fetal movement were reviewed in detail with the patient.  All questions were answered. Pt has home bp cuff. Check bp  weekly, let us know if >140/90.   Follow-up: Return in about 2 weeks (around 06/16/2021) for HROB visit (ok for CNM) and 4 wk growth scan.   Future Appointments  Date Time Provider [place]  06/02/2021 11:50 AM Janyth Pupa, DO [facility]    No orders of the defined types were placed in this encounter.   Janyth Pupa, DO Attending [place], [facility] for [employer], [place]

## 2021-06-17 ENCOUNTER — Other Ambulatory Visit (HOSPITAL_COMMUNITY)
Admission: RE | Admit: 2021-06-17 | Discharge: 2021-06-17 | Disposition: A | Discharge status: Home / Self Care | Payer: Medicaid Other | Source: Ambulatory Visit | Attending: Women's Health | Admitting: Women's Health

## 2021-06-17 ENCOUNTER — Ambulatory Visit (INDEPENDENT_AMBULATORY_CARE_PROVIDER_SITE_OTHER): Payer: Medicaid Other | Admitting: Women's Health

## 2021-06-17 ENCOUNTER — Encounter: Payer: Self-pay | Admitting: Women's Health

## 2021-06-17 ENCOUNTER — Other Ambulatory Visit: Payer: Self-pay

## 2021-06-17 VITALS — BP 136/81 | HR 94 | Wt 238.0 lb

## 2021-06-17 DIAGNOSIS — N898 Other specified noninflammatory disorders of vagina: Secondary | ICD-10-CM

## 2021-06-17 DIAGNOSIS — O0993 Supervision of high risk pregnancy, unspecified, third trimester: Secondary | ICD-10-CM | POA: Insufficient documentation

## 2021-06-17 DIAGNOSIS — O26893 Other specified pregnancy related conditions, third trimester: Secondary | ICD-10-CM | POA: Insufficient documentation

## 2021-06-17 DIAGNOSIS — I10 Essential (primary) hypertension: Secondary | ICD-10-CM

## 2021-06-17 LAB — POCT URINALYSIS DIPSTICK OB
Blood, UA: NEGATIVE
Glucose, UA: NEGATIVE
Ketones, UA: NEGATIVE
Leukocytes, UA: NEGATIVE
Nitrite, UA: NEGATIVE
POC,PROTEIN,UA: NEGATIVE

## 2021-06-17 LAB — OB RESULTS CONSOLE GC/CHLAMYDIA: Gonorrhea: NEGATIVE

## 2021-06-17 NOTE — Patient Instructions (Signed)
Roberta Guerrero, thank you for choosing our office today! We appreciate the opportunity to meet your healthcare needs. You may receive a short survey by mail, e-mail, or through [employer]. If you are happy with your care we would appreciate if you could take just a few minutes to complete the survey questions. We read all of your comments and take your feedback very seriously. Thank you again for choosing our office.  Center for [employer] Team at [facility]  [facility] at [facility] ([address] [place], [place] [address]) Entrance C, located off of [address] Free 24/7 valet parking   CLASSES: Go to [email or web address] to register for classes (childbirth, breastfeeding, waterbirth, infant CPR, daddy bootcamp, etc.)  Call the office [phone]) or go to [facility] if: You begin to have strong, frequent contractions Your water breaks.  Sometimes it is a big gush of fluid, sometimes it is just a trickle that keeps getting your panties wet or running down your legs You have vaginal bleeding.  It is normal to have a small amount of spotting if your cervix was checked.  You don't feel your baby moving like normal.  If you don't, get you something to eat and drink and lay down and focus on feeling your baby move.   If your baby is still not moving like normal, you should call the office or go to [facility].  Call the office [phone]) or go to [facility] hospital for these signs of pre-eclampsia: Severe headache that does not go away with Tylenol Visual changes- seeing spots, double, blurred vision Pain under your right breast or upper abdomen that does not go away with Tums or heartburn medicine Nausea and/or vomiting Severe swelling in your hands, feet, and face   Tdap Vaccine It is recommended that you get the Tdap vaccine during the third trimester of EACH pregnancy to help protect your baby from getting pertussis (whooping cough) 27-36 weeks is the BEST time to do  this so that you can pass the protection on to your baby. During pregnancy is better than after pregnancy, but if you are unable to get it during pregnancy it will be offered at the hospital.  You can get this vaccine with [place], at the health department, your family doctor, or some local pharmacies Everyone who will be around your baby should also be up-to-date on their vaccines before the baby comes. Adults (who are not pregnant) only need 1 dose of Tdap during adulthood.   [facility] Pediatricians/Family Doctors [facility]): [address] Colette Ribas, [phone]           [facility]: [address], [phone]                [facility]): [address], [phone] (call to ask if accepting patients) [facility] Department: [address], [place], [phone]    [facility] Pediatricians/Family Doctors [facility]): [phone], [phone] [facility]: [address] [place], [phone] [facility]: [address], [phone]  [facility] Doctors  [facility]): [phone] [facility]: [address], [phone]   [facility] Doctors [facility]: [address] [phone]  [facility] Family Doctors  [facility]: [phone], [phone]  Home Blood Pressure Monitoring for Patients   Your provider has recommended that you check your  blood pressure (BP) at least once a week at home. If you do not have a blood pressure cuff at home, one will be provided for you. Contact your provider if you have not received your monitor within 1 week.  ° °Helpful Tips for Accurate Home Blood Pressure Checks  °Don't smoke, exercise, or drink caffeine 30 minutes before checking your BP °Use the restroom before checking your BP (a full bladder can raise your  pressure) °Relax in a comfortable upright chair °Feet on the ground °Left arm resting comfortably on a flat surface at the level of your heart °Legs uncrossed °Back supported °Sit quietly and don't talk °Place the cuff on your bare arm °Adjust snuggly, so that only two fingertips can fit between your skin and the top of the cuff °Check 2 readings separated by at least one minute °Keep a log of your BP readings °For a visual, please reference this diagram: http://ccnc.care/bpdiagram ° °Provider Name: [facility]     Phone: [phone] ° °Zone 1: ALL CLEAR  °Continue to monitor your symptoms:  °BP reading is less than 140 (top number) or less than 90 (bottom number)  °No right upper stomach pain °No headaches or seeing spots °No feeling nauseated or throwing up °No swelling in face and hands ° °Zone 2: CAUTION °Call your doctor's office for any of the following:  °BP reading is greater than 140 (top number) or greater than 90 (bottom number)  °Stomach pain under your ribs in the middle or right side °Headaches or seeing spots °Feeling nauseated or throwing up °Swelling in face and hands ° °Zone 3: EMERGENCY  °Seek immediate medical care if you have any of the following:  °BP reading is greater than160 (top number) or greater than 110 (bottom number) °Severe headaches not improving with Tylenol °Serious difficulty catching your breath °Any worsening symptoms from Zone 2  °Preterm Labor and Birth Information ° °The normal length of a pregnancy is 39-41 weeks. Preterm labor is when labor starts before 37 completed weeks of pregnancy. °What are the risk factors for preterm labor? °Preterm labor is more likely to occur in women who: °Have certain infections during pregnancy such as a bladder infection, sexually transmitted infection, or infection inside the uterus (chorioamnionitis). °Have a shorter-than-normal cervix. °Have gone into preterm labor before. °Have had surgery on their cervix. °Are younger than age 17  or older than age 35. °Are African American. °Are pregnant with twins or multiple babies (multiple gestation). °Take street drugs or smoke while pregnant. °Do not gain enough weight while pregnant. °Became pregnant shortly after having been pregnant. °What are the symptoms of preterm labor? °Symptoms of preterm labor include: °Cramps similar to those that can happen during a menstrual period. The cramps may happen with diarrhea. °Pain in the abdomen or lower back. °Regular uterine contractions that may feel like tightening of the abdomen. °A feeling of increased pressure in the pelvis. °Increased watery or bloody mucus discharge from the vagina. °Water breaking (ruptured amniotic sac). °Why is it important to recognize signs of preterm labor? °It is important to recognize signs of preterm labor because babies who are born prematurely may not be fully developed. This can put them at an increased risk for: °Long-term (chronic) heart and lung problems. °Difficulty immediately after birth with regulating body systems, including blood sugar, body temperature, heart rate, and breathing rate. °Bleeding in the brain. °Cerebral palsy. °Learning difficulties. °Death. °These risks are highest for babies who are born before 34 weeks   of pregnancy. How is preterm labor treated? Treatment depends on the length of your pregnancy, your condition, and the health of your baby. It may involve: Having a stitch (suture) placed in your cervix to prevent your cervix from opening too early (cerclage). Taking or being given medicines, such as: Hormone medicines. These may be given early in pregnancy to help support the pregnancy. Medicine to stop contractions. Medicines to help mature the babys lungs. These may be prescribed if the risk of delivery is high. Medicines to prevent your baby from developing cerebral palsy. If the labor happens before 34 weeks of pregnancy, you may need to stay in the hospital. What should I do if I  think I am in preterm labor? If you think that you are going into preterm labor, call your health care provider right away. How can I prevent preterm labor in future pregnancies? To increase your chance of having a full-term pregnancy: Do not use any tobacco products, such as cigarettes, chewing tobacco, and e-cigarettes. If you need help quitting, ask your health care provider. Do not use street drugs or medicines that have not been prescribed to you during your pregnancy. Talk with your health care provider before taking any herbal supplements, even if you have been taking them regularly. Make sure you gain a healthy amount of weight during your pregnancy. Watch for infection. If you think that you might have an infection, get it checked right away. Make sure to tell your health care provider if you have gone into preterm labor before. This information is not intended to replace advice given to you by your health care provider. Make sure you discuss any questions you have with your health care provider. Document Revised: 07/22/2018 Document Reviewed: 08/21/2015 Elsevier Patient Education  [place].

## 2021-06-17 NOTE — Progress Notes (Signed)
? ?HIGH-RISK PREGNANCY VISIT ?Patient name: Roberta Guerrero MRN [id number]  Date of birth: 13-Jul-1995 ?Chief Complaint:   ?Routine Prenatal Visit ? ?History of Present Illness:   ?Roberta Guerrero is a 26 y.o. G38P0000 female at [redacted]w[redacted]d with an Estimated Date of Delivery: 08/06/21 being seen today for ongoing management of a high-risk pregnancy complicated by chronic hypertension currently on no meds.   ? ?Today she reports vaginal discharge, just feels uncomfortable.  Contractions: Not present. Vag. Bleeding: None.  Movement: Present. denies leaking of fluid.  ? ?Depression screen [facility] 2/9 05/20/2021 01/30/2021 06/06/2020 07/14/2016  ?Decreased Interest 0 0 0 0  ?Down, Depressed, Hopeless 0 0 0 0  ?PHQ - 2 Score 0 0 0 0  ?Altered sleeping 1 1 0 -  ?Tired, decreased energy 2 3 2  -  ?Change in appetite 0 2 0 -  ?Feeling bad or failure about yourself  0 0 1 -  ?Trouble concentrating 0 0 0 -  ?Moving slowly or fidgety/restless 0 0 0 -  ?Suicidal thoughts 0 0 0 -  ?PHQ-9 Score 3 6 3  -  ? ?  ?GAD 7 : Generalized Anxiety Score 05/20/2021 01/30/2021 06/06/2020  ?Nervous, Anxious, on Edge 0 0 0  ?Control/stop worrying 0 0 0  ?Worry too much - different things 0 0 0  ?Trouble relaxing 0 0 0  ?Restless 0 0 0  ?Easily annoyed or irritable 0 0 0  ?Afraid - awful might happen 0 0 0  ?Total GAD 7 Score 0 0 0  ? ? ? ?Review of Systems:   ?Pertinent items are noted in HPI ?Denies abnormal vaginal discharge w/ itching/odor/irritation, headaches, visual changes, shortness of breath, chest pain, abdominal pain, severe nausea/vomiting, or problems with urination or bowel movements unless otherwise stated above. ?Pertinent History Reviewed:  ?Reviewed past medical,surgical, social, obstetrical and family history.  ?Reviewed problem list, medications and allergies. ?Physical Assessment:  ? ?Vitals:  ? 06/17/21 0937  ?BP: 136/81  ?Pulse: 94  ?Weight: 238 lb (108 kg)  ?Body mass index is 39 kg/m?. ?     ?     Physical Examination:  ? General appearance:  alert, well appearing, and in no distress ? Mental status: alert, oriented to person, place, and time ? Skin: warm & dry  ? Extremities: Edema: None  ?  Cardiovascular: normal heart rate noted ? Respiratory: normal respiratory effort, no distress ? Abdomen: gravid, soft, non-tender ? Pelvic:  spec exam, cx visually closed, small amt white d/c, CV swab collected         ? ?Fetal Status: Fetal Heart Rate (bpm): 153 Fundal Height: 32 cm Movement: Present   ? ?Fetal Surveillance Testing today: doppler  ? ?Chaperone: Celene Squibb   ? ?No results found for this or any previous visit (from the past 24 hour(s)).  ?Assessment & Plan:  ?High-risk pregnancy: G1P0000 at [redacted]w[redacted]d with an Estimated Date of Delivery: 08/06/21  ? ?1) CHTN, stable, no meds, reviewed pre-e s/s, reasons to seek care, check bp daily ? ?2) Vaginal d/c> CV swab ? ?Meds: No orders of the defined types were placed in this encounter. ? ? ?Labs/procedures today: spec exam and CV swab ? ?Treatment Plan:     Growth u/s q4wks     no antenatal testing   Deliver @ 38-39.6wks:______  ? ?Reviewed: Preterm labor symptoms and general obstetric precautions including but not limited to vaginal bleeding, contractions, leaking of fluid and fetal movement were reviewed in detail with the patient.  All questions were answered. Does have home bp cuff. Office bp cuff given: not applicable. Check bp daily, let us know if consistently >140 and/or >90. ? ?Follow-up: Return for As scheduled. ? ? ?Future Appointments  ?Date Time Provider [place]  ?07/01/2021  2:15 PM [place] - [facility] [place] [facility] None  ?07/01/2021  3:30 PM Roma Schanz, CNM [facility]  ?07/08/2021  9:10 AM Florian Buff, MD [facility]  ?07/15/2021  9:10 AM Roma Schanz, CNM [facility]  ?07/22/2021  9:10 AM Florian Buff, MD [facility]  ?07/29/2021  9:00 AM [facility] [place] [facility] None  ?07/29/2021  9:50 AM Janyth Pupa, DO [facility]  ? ? ?Orders Placed This Encounter   ?Procedures  ? US OB Follow Up  ? ?[place], [place] ?06/17/2021 ?10:06 AM  ?

## 2021-06-17 NOTE — Addendum Note (Signed)
Addended by: Annamarie Dawley on: 06/17/2021 10:31 AM ? ? Modules accepted: Orders ? ?

## 2021-06-18 ENCOUNTER — Other Ambulatory Visit: Payer: Self-pay

## 2021-06-18 ENCOUNTER — Encounter (HOSPITAL_COMMUNITY): Payer: Self-pay | Admitting: Obstetrics & Gynecology

## 2021-06-18 ENCOUNTER — Inpatient Hospital Stay (HOSPITAL_COMMUNITY)
Admission: AD | Admit: 2021-06-18 | Discharge: 2021-06-18 | Disposition: A | Discharge status: Home / Self Care | Payer: Medicaid Other | Attending: Obstetrics & Gynecology | Admitting: Obstetrics & Gynecology

## 2021-06-18 DIAGNOSIS — Z3A33 33 weeks gestation of pregnancy: Secondary | ICD-10-CM | POA: Diagnosis not present

## 2021-06-18 DIAGNOSIS — J45909 Unspecified asthma, uncomplicated: Secondary | ICD-10-CM | POA: Insufficient documentation

## 2021-06-18 DIAGNOSIS — O99513 Diseases of the respiratory system complicating pregnancy, third trimester: Secondary | ICD-10-CM | POA: Diagnosis not present

## 2021-06-18 DIAGNOSIS — Z87891 Personal history of nicotine dependence: Secondary | ICD-10-CM | POA: Insufficient documentation

## 2021-06-18 DIAGNOSIS — O10013 Pre-existing essential hypertension complicating pregnancy, third trimester: Secondary | ICD-10-CM | POA: Diagnosis present

## 2021-06-18 DIAGNOSIS — Z7982 Long term (current) use of aspirin: Secondary | ICD-10-CM | POA: Insufficient documentation

## 2021-06-18 DIAGNOSIS — O119 Pre-existing hypertension with pre-eclampsia, unspecified trimester: Secondary | ICD-10-CM

## 2021-06-18 DIAGNOSIS — R8271 Bacteriuria: Secondary | ICD-10-CM

## 2021-06-18 DIAGNOSIS — Z7951 Long term (current) use of inhaled steroids: Secondary | ICD-10-CM | POA: Insufficient documentation

## 2021-06-18 DIAGNOSIS — Z3689 Encounter for other specified antenatal screening: Secondary | ICD-10-CM

## 2021-06-18 DIAGNOSIS — O113 Pre-existing hypertension with pre-eclampsia, third trimester: Secondary | ICD-10-CM | POA: Diagnosis not present

## 2021-06-18 LAB — COMPREHENSIVE METABOLIC PANEL
ALT: 65 U/L — ABNORMAL HIGH (ref 0–44)
AST: 48 U/L — ABNORMAL HIGH (ref 15–41)
Albumin: 2.8 g/dL — ABNORMAL LOW (ref 3.5–5.0)
Alkaline Phosphatase: 64 U/L (ref 38–126)
Anion gap: 9 (ref 5–15)
BUN: 6 mg/dL (ref 6–20)
CO2: 21 mmol/L — ABNORMAL LOW (ref 22–32)
Calcium: 8.3 mg/dL — ABNORMAL LOW (ref 8.9–10.3)
Chloride: 103 mmol/L (ref 98–111)
Creatinine, Ser: 0.64 mg/dL (ref 0.44–1.00)
GFR, Estimated: >60 mL/min (ref >60)
Glucose, Bld: 108 mg/dL — ABNORMAL HIGH (ref 70–99)
Potassium: 3.4 mmol/L — ABNORMAL LOW (ref 3.5–5.1)
Sodium: 133 mmol/L — ABNORMAL LOW (ref 135–145)
Total Bilirubin: 0.4 mg/dL (ref 0.3–1.2)
Total Protein: 6.1 g/dL — ABNORMAL LOW (ref 6.5–8.1)

## 2021-06-18 LAB — CERVICOVAGINAL ANCILLARY ONLY
Bacterial Vaginitis (gardnerella): NEGATIVE
Candida Glabrata: NEGATIVE
Candida Vaginitis: NEGATIVE
Chlamydia: NEGATIVE
Comment: NEGATIVE
Comment: NEGATIVE
Comment: NEGATIVE
Comment: NEGATIVE
Comment: NEGATIVE
Comment: NORMAL
Neisseria Gonorrhea: NEGATIVE
Trichomonas: NEGATIVE

## 2021-06-18 LAB — URINALYSIS, ROUTINE W REFLEX MICROSCOPIC
Bilirubin Urine: NEGATIVE
Glucose, UA: NEGATIVE mg/dL
Hgb urine dipstick: NEGATIVE
Ketones, ur: NEGATIVE mg/dL
Nitrite: NEGATIVE
Protein, ur: NEGATIVE mg/dL
Specific Gravity, Urine: 1.023 (ref 1.005–1.030)
pH: 6 (ref 5.0–8.0)

## 2021-06-18 LAB — CBC
HCT: 29.9 % — ABNORMAL LOW (ref 36.0–46.0)
Hemoglobin: 9.7 g/dL — ABNORMAL LOW (ref 12.0–15.0)
MCH: 27.9 pg (ref 26.0–34.0)
MCHC: 32.4 g/dL (ref 30.0–36.0)
MCV: 85.9 fL (ref 80.0–100.0)
Platelets: 221 10*3/uL (ref 150–400)
RBC: 3.48 MIL/uL — ABNORMAL LOW (ref 3.87–5.11)
RDW: 12.9 % (ref 11.5–15.5)
WBC: 8.2 10*3/uL (ref 4.0–10.5)
nRBC: 0 % (ref 0.0–0.2)

## 2021-06-18 LAB — PROTEIN / CREATININE RATIO, URINE
Creatinine, Urine: 176.54 mg/dL
Protein Creatinine Ratio: 0.2 mg/mg{Cre} — ABNORMAL HIGH (ref 0.00–0.15)
Total Protein, Urine: 35 mg/dL

## 2021-06-18 MED ORDER — LABETALOL HCL 5 MG/ML IV SOLN: 80.0000 mg | INTRAVENOUS | Status: DC | PRN

## 2021-06-18 MED ORDER — HYDRALAZINE HCL 20 MG/ML IJ SOLN: 10.0000 mg | INTRAMUSCULAR | Status: DC | PRN

## 2021-06-18 MED ORDER — LABETALOL HCL 5 MG/ML IV SOLN: 20.0000 mg | INTRAVENOUS | Status: DC | PRN

## 2021-06-18 MED ORDER — HYDRALAZINE HCL 20 MG/ML IJ SOLN: 5.0000 mg | INTRAMUSCULAR | Status: DC | PRN

## 2021-06-18 MED ORDER — LABETALOL HCL 5 MG/ML IV SOLN: 40.0000 mg | INTRAVENOUS | Status: DC | PRN

## 2021-06-18 NOTE — MAU Note (Addendum)
I went to the doctor yesterday and was told to take my B/P daily. Checked it at work it was 139/88. Took it at home and was 148/87. At 2030 it was 158/96. Have a slight headache and my right hand is tingling. Reports good FM. Denies visual changes. No pain RUQ.  ?

## 2021-06-18 NOTE — MAU Provider Note (Signed)
History     CSN: [id number]  Arrival date and time: 06/18/21 2141   Event Date/Time   First Provider Initiated Contact with Patient 06/18/21 2230      Chief Complaint  Patient presents with   Hypertension   Headache   Roberta Guerrero is a 26 y.o. G1P0 at [redacted]w[redacted]d who receives care at [facility].  She presents  today for Hypertension and Headache.  She reports she has been taking her blood pressure throughout the day and noticing increases.  She endorses a history of CHTN and no meds.  Patient reports she has been experiencing a mild HA that started ~30 minutes ago.  She rates the HA a 2-3/10.  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  She endorses fetal movement, denies vaginal concerns, and has no issues with urination, constipation, or diarrhea.  She also denies pain in her legs, RUQ, SOB, and visual disturbances.  She reports a history of asthma and used her inhaler this morning due to chest tightness.  Patient last ate about 1 hour prior to arrival.   OB History     Gravida  1   Para  0   Term  0   Preterm  0   AB  0   Living  0      SAB  0   IAB  0   Ectopic  0   Multiple  0   Live Births  0           Past Medical History:  Diagnosis Date   Allergic rhinitis    Asthma    Headache    History of chlamydia    Hypertension    Menstrual migraine     Past Surgical History:  Procedure Laterality Date   CHOLECYSTECTOMY     MOUTH SURGERY     SKIN BIOPSY      Family History  Problem Relation Age of Onset   Hypertension Mother    Hypertension Father    Hyperlipidemia Father    Autism Brother    Autism Brother    Hypertension Maternal Uncle    Hypertension Maternal Grandmother     Alcohol abuse Maternal Grandfather     Social History   Tobacco Use   Smoking status: Former    Types: Cigars   Smokeless tobacco: Never  Vaping Use   Vaping Use: Never used  Substance Use Topics   Alcohol use: Not Currently    Comment: occasional   Drug use: Not Currently    Types: Marijuana    Allergies:  Allergies  Allergen Reactions   Corn-Containing Products Rash   Other Rash    Nuts    Medications Prior to Admission  Medication Sig Dispense Refill Last Dose   albuterol (VENTOLIN HFA) 108 (90 Base) MCG/ACT inhaler Inhale 2 puffs into the lungs every 6 (six) hours as needed for wheezing or shortness of breath. 8 g 2 06/18/2021   aspirin EC 81 MG tablet Take 2 tablets (162 mg total) by mouth daily. 60 tablet 6 06/18/2021   ferrous sulfate 325 (65 FE) MG tablet Take 1 tablet (325 mg total) by mouth every other day. 45 tablet 2 Past Week  budesonide (PULMICORT FLEXHALER) 180 MCG/ACT inhaler Inhale 1 puff into the lungs in the morning and at bedtime. (Patient not taking: Reported on 05/20/2021) 1 each 5    Spacer/Aero-Holding Chambers (AEROCHAMBER PLUS) inhaler Use with inhaler 1 each 2 Unknown   triamcinolone ointment (KENALOG) 0.1 % Apply 1 application topically 2 (two) times daily. 454 g 1 More than a month    Review of Systems  Eyes:  Negative for visual disturbance.  Respiratory:  Negative for shortness of breath.   Gastrointestinal:  Negative for abdominal pain (Tenderness, intermittent), constipation, diarrhea, nausea and vomiting.  Genitourinary:  Negative for difficulty urinating, dysuria, vaginal bleeding and vaginal discharge.  Neurological:  Positive for headaches. Negative for dizziness and light-headedness.  Physical Exam   Blood pressure (!) 166/93, pulse 91, temperature (!) 97.5 F (36.4 C), resp. rate 20, height 5\' 5"  (1.651 m), weight 110.2 kg, last menstrual period 10/30/2020, SpO2 100 %. Vitals:   06/18/21 2230 06/18/21 2300 06/18/21 2315 06/18/21 2330   BP: (!) 152/80 (!) 153/79 (!) 155/83 140/78  Pulse: 87 87 85 95  Resp:      Temp:      SpO2:      Weight:      Height:        Physical Exam Vitals and nursing note reviewed.  Constitutional:      General: She is not in acute distress.    Appearance: She is well-developed. She is not toxic-appearing.  HENT:     Head: Normocephalic and atraumatic.  Eyes:     Conjunctiva/sclera: Conjunctivae normal.  Cardiovascular:     Rate and Rhythm: Normal rate and regular rhythm.     Heart sounds: Normal heart sounds.  Pulmonary:     Effort: Pulmonary effort is normal. No respiratory distress.     Breath sounds: Normal breath sounds.  Abdominal:     Palpations: Abdomen is soft.     Tenderness: There is no abdominal tenderness.     Comments: Gravid  Musculoskeletal:        General: Normal range of motion.     Cervical back: Normal range of motion.     Right lower leg: Edema present.     Left lower leg: Edema present.  Skin:    General: Skin is dry.  Neurological:     Mental Status: She is alert and oriented to person, place, and time.     Comments: Negative clonus  Psychiatric:        Mood and Affect: Mood normal.        Speech: Speech normal.        Behavior: Behavior normal.    Fetal Assessment 145 bpm, Mod Var, -Decels, +Accels Toco: No CTX graphed  MAU Course   Results for orders placed or performed during the hospital encounter of 06/18/21 (from the past 24 hour(s))  Urinalysis, Routine w reflex microscopic Urine, Clean Catch     Status: Abnormal   Collection Time: 06/18/21 10:00 PM  Result Value Ref Range   Color, Urine YELLOW YELLOW   APPearance HAZY (A) CLEAR   Specific Gravity, Urine 1.023 1.005 - 1.030   pH 6.0 5.0 - 8.0   Glucose, UA NEGATIVE NEGATIVE mg/dL   Hgb urine dipstick NEGATIVE NEGATIVE   Bilirubin Urine NEGATIVE NEGATIVE   Ketones, ur NEGATIVE NEGATIVE mg/dL   Protein, ur NEGATIVE NEGATIVE mg/dL   Nitrite NEGATIVE NEGATIVE   Leukocytes,Ua TRACE  (A) NEGATIVE   RBC / HPF 0-5 0 - 5 RBC/hpf  WBC, UA 0-5 0 - 5 WBC/hpf   Bacteria, UA RARE (A) NONE SEEN   Squamous Epithelial / LPF 0-5 0 - 5   Mucus PRESENT   Protein / creatinine ratio, urine     Status: Abnormal   Collection Time: 06/18/21 10:00 PM  Result Value Ref Range   Creatinine, Urine 176.54 mg/dL   Total Protein, Urine 35 mg/dL   Protein Creatinine Ratio 0.20 (H) 0.00 - 0.15 mg/mg[Cre]  Comprehensive metabolic panel     Status: Abnormal   Collection Time: 06/18/21 10:47 PM  Result Value Ref Range   Sodium 133 (L) 135 - 145 mmol/L   Potassium 3.4 (L) 3.5 - 5.1 mmol/L   Chloride 103 98 - 111 mmol/L   CO2 21 (L) 22 - 32 mmol/L   Glucose, Bld 108 (H) 70 - 99 mg/dL   BUN 6 6 - 20 mg/dL   Creatinine, Ser 0.64 0.44 - 1.00 mg/dL   Calcium 8.3 (L) 8.9 - 10.3 mg/dL   Total Protein 6.1 (L) 6.5 - 8.1 g/dL   Albumin 2.8 (L) 3.5 - 5.0 g/dL   AST 48 (H) 15 - 41 U/L   ALT 65 (H) 0 - 44 U/L   Alkaline Phosphatase 64 38 - 126 U/L   Total Bilirubin 0.4 0.3 - 1.2 mg/dL   GFR, Estimated >60 >60 mL/min   Anion gap 9 5 - 15  CBC     Status: Abnormal   Collection Time: 06/18/21 10:47 PM  Result Value Ref Range   WBC 8.2 4.0 - 10.5 K/uL   RBC 3.48 (L) 3.87 - 5.11 MIL/uL   Hemoglobin 9.7 (L) 12.0 - 15.0 g/dL   HCT 29.9 (L) 36.0 - 46.0 %   MCV 85.9 80.0 - 100.0 fL   MCH 27.9 26.0 - 34.0 pg   MCHC 32.4 30.0 - 36.0 g/dL   RDW 12.9 11.5 - 15.5 %   Platelets 221 150 - 400 K/uL   nRBC 0.0 0.0 - 0.2 %   No results found.  MDM Physical Exam Labs: CBC, CMP, PC Ratio Measure BPQ15 min EFM Consult Assessment and Plan  26 year old G1P0  SIUP at 70 weeks Cat I FT CHTN  -POC Reviewed -Exam performed.  -Labs ordered. -Start IV saline lock. -Medication per protocol with consideration of asthma. -Patient offered and declines pain medication. -NST reactive -We will monitor and await results.  Maryann Conners MSN, CNM 06/18/2021, 10:30 PM   Reassessment (11:38 PM) -Dr.U. Anyanwu  consulted and informed of patient status, evaluation, interventions, and results. Advised: *Give diagnosis of SuperImposed PreE w/o SF. *Educate patient on need to treat HA if worsens.  *Patient to follow up with primary office in 24-48 hrs.  *No other interventions -Provider to bedside to inform patient of POC. -Patient questions why her blood pressure has suddenly increased. Educated on PreEclampsia and pregnancy. -Discussed treatment of worsening HA with tylenol and instructed to notify office or return to MAU for no relief. -Patient verbalizes understanding. -Discharged to home in stable condition. -Message sent to family tree, D. Norma Fredrickson and L. Cresenzo, regarding need to schedule follow-up  Maryann Conners MSN, [place] Provider, Center for [employer]

## 2021-06-19 ENCOUNTER — Ambulatory Visit (INDEPENDENT_AMBULATORY_CARE_PROVIDER_SITE_OTHER): Payer: Medicaid Other | Admitting: Obstetrics & Gynecology

## 2021-06-19 ENCOUNTER — Encounter: Payer: Self-pay | Admitting: Obstetrics & Gynecology

## 2021-06-19 VITALS — BP 136/91 | HR 90 | Wt 243.0 lb

## 2021-06-19 DIAGNOSIS — O0993 Supervision of high risk pregnancy, unspecified, third trimester: Secondary | ICD-10-CM

## 2021-06-19 DIAGNOSIS — Z3A33 33 weeks gestation of pregnancy: Secondary | ICD-10-CM

## 2021-06-19 LAB — POCT URINALYSIS DIPSTICK OB
Blood, UA: NEGATIVE
Glucose, UA: NEGATIVE
Ketones, UA: NEGATIVE
Nitrite, UA: NEGATIVE

## 2021-06-19 NOTE — Progress Notes (Signed)
? ?HIGH-RISK PREGNANCY VISIT ?Patient name: Roberta Guerrero MRN [id number]  Date of birth: September 27, 1995 ?Chief Complaint:   ?High Risk Gestation (BP elevated yesterday; went to MAU; feels a tightness in both shoulders; seeing black dots today; + headache) ? ?History of Present Illness:   ?Roberta Guerrero is a 26 y.o. G39P0000 female at [redacted]w[redacted]d with an Estimated Date of Delivery: 08/06/21 being seen today for ongoing management of a high-risk pregnancy complicated by [facility] no meds.   ? ?Today she reports headache which has resolved. Contractions: Irritability. Vag. Bleeding: None.  Movement: Present. denies leaking of fluid.  ? ?Depression screen [facility] 2/9 05/20/2021 01/30/2021 06/06/2020 07/14/2016  ?Decreased Interest 0 0 0 0  ?Down, Depressed, Hopeless 0 0 0 0  ?PHQ - 2 Score 0 0 0 0  ?Altered sleeping 1 1 0 -  ?Tired, decreased energy 2 3 2  -  ?Change in appetite 0 2 0 -  ?Feeling bad or failure about yourself  0 0 1 -  ?Trouble concentrating 0 0 0 -  ?Moving slowly or fidgety/restless 0 0 0 -  ?Suicidal thoughts 0 0 0 -  ?PHQ-9 Score 3 6 3  -  ? ?  ?GAD 7 : Generalized Anxiety Score 05/20/2021 01/30/2021 06/06/2020  ?Nervous, Anxious, on Edge 0 0 0  ?Control/stop worrying 0 0 0  ?Worry too much - different things 0 0 0  ?Trouble relaxing 0 0 0  ?Restless 0 0 0  ?Easily annoyed or irritable 0 0 0  ?Afraid - awful might happen 0 0 0  ?Total GAD 7 Score 0 0 0  ? ? ? ?Review of Systems:   ?Pertinent items are noted in HPI ?Denies abnormal vaginal discharge w/ itching/odor/irritation, headaches, visual changes, shortness of breath, chest pain, abdominal pain, severe nausea/vomiting, or problems with urination or bowel movements unless otherwise stated above. ?Pertinent History Reviewed:  ?Reviewed past medical,surgical, social, obstetrical and family history.  ?Reviewed problem list, medications and allergies. ?Physical Assessment:  ? ?Vitals:  ? 06/19/21 1604  ?BP: (!) 136/91  ?Pulse: 90  ?Weight: 243 lb (110.2 kg)  ?Body mass index is  40.44 kg/m?. ?     ?     Physical Examination:  ? General appearance: alert, well appearing, and in no distress ? Mental status: alert, oriented to person, place, and time ? Skin: warm & dry  ? Extremities: Edema: Trace  ?  Cardiovascular: normal heart rate noted ? Respiratory: normal respiratory effort, no distress ? Abdomen: gravid, soft, non-tender ? Pelvic: Cervical exam deferred        ? ?Fetal Status:     Movement: Present   ? ?Fetal Surveillance Testing today: FHR  135 ? ?Chaperone: N/A   ? ?Results for orders placed or performed in visit on 06/19/21 (from the past 24 hour(s))  ?POC Urinalysis Dipstick OB  ? Collection Time: 06/19/21  4:09 PM  ?Result Value Ref Range  ? Color, UA    ? Clarity, UA    ? Glucose, UA Negative Negative  ? Bilirubin, UA    ? Ketones, UA neg   ? Spec Grav, UA    ? Blood, UA neg   ? pH, UA    ? POC,PROTEIN,UA Trace Negative, Trace, Small (1+), Moderate (2+), Large (3+), 4+  ? Urobilinogen, UA    ? Nitrite, UA neg   ? Leukocytes, UA Trace (A) Negative  ? Appearance    ? Odor    ?Results for orders placed or performed during  the hospital encounter of 06/18/21 (from the past 24 hour(s))  ?Urinalysis, Routine w reflex microscopic Urine, Clean Catch  ? Collection Time: 06/18/21 10:00 PM  ?Result Value Ref Range  ? Color, Urine YELLOW YELLOW  ? APPearance HAZY (A) CLEAR  ? Specific Gravity, Urine 1.023 1.005 - 1.030  ? pH 6.0 5.0 - 8.0  ? Glucose, UA NEGATIVE NEGATIVE mg/dL  ? Hgb urine dipstick NEGATIVE NEGATIVE  ? Bilirubin Urine NEGATIVE NEGATIVE  ? Ketones, ur NEGATIVE NEGATIVE mg/dL  ? Protein, ur NEGATIVE NEGATIVE mg/dL  ? Nitrite NEGATIVE NEGATIVE  ? Leukocytes,Ua TRACE (A) NEGATIVE  ? RBC / HPF 0-5 0 - 5 RBC/hpf  ? WBC, UA 0-5 0 - 5 WBC/hpf  ? Bacteria, UA RARE (A) NONE SEEN  ? Squamous Epithelial / LPF 0-5 0 - 5  ? Mucus PRESENT   ?Protein / creatinine ratio, urine  ? Collection Time: 06/18/21 10:00 PM  ?Result Value Ref Range  ? Creatinine, Urine 176.54 mg/dL  ? Total Protein,  Urine 35 mg/dL  ? Protein Creatinine Ratio 0.20 (H) 0.00 - 0.15 mg/mg[Cre]  ?Comprehensive metabolic panel  ? Collection Time: 06/18/21 10:47 PM  ?Result Value Ref Range  ? Sodium 133 (L) 135 - 145 mmol/L  ? Potassium 3.4 (L) 3.5 - 5.1 mmol/L  ? Chloride 103 98 - 111 mmol/L  ? CO2 21 (L) 22 - 32 mmol/L  ? Glucose, Bld 108 (H) 70 - 99 mg/dL  ? BUN 6 6 - 20 mg/dL  ? Creatinine, Ser 0.64 0.44 - 1.00 mg/dL  ? Calcium 8.3 (L) 8.9 - 10.3 mg/dL  ? Total Protein 6.1 (L) 6.5 - 8.1 g/dL  ? Albumin 2.8 (L) 3.5 - 5.0 g/dL  ? AST 48 (H) 15 - 41 U/L  ? ALT 65 (H) 0 - 44 U/L  ? Alkaline Phosphatase 64 38 - 126 U/L  ? Total Bilirubin 0.4 0.3 - 1.2 mg/dL  ? GFR, Estimated >60 >60 mL/min  ? Anion gap 9 5 - 15  ?CBC  ? Collection Time: 06/18/21 10:47 PM  ?Result Value Ref Range  ? WBC 8.2 4.0 - 10.5 K/uL  ? RBC 3.48 (L) 3.87 - 5.11 MIL/uL  ? Hemoglobin 9.7 (L) 12.0 - 15.0 g/dL  ? HCT 29.9 (L) 36.0 - 46.0 %  ? MCV 85.9 80.0 - 100.0 fL  ? MCH 27.9 26.0 - 34.0 pg  ? MCHC 32.4 30.0 - 36.0 g/dL  ? RDW 12.9 11.5 - 15.5 %  ? Platelets 221 150 - 400 K/uL  ? nRBC 0.0 0.0 - 0.2 %  ?  ?Assessment & Plan:  ?High-risk pregnancy: G1P0000 at [redacted]w[redacted]d with an Estimated Date of Delivery: 08/06/21  ? ?1) CHTN, stable, MAU visit yesterday with elevated LFTs, will recheck next week ? ? ? ?Meds: No orders of the defined types were placed in this encounter. ? ? ?Labs/procedures today: none ? ?Treatment Plan:  close interval surveillance ? ?Reviewed: Preterm labor symptoms and general obstetric precautions including but not limited to vaginal bleeding, contractions, leaking of fluid and fetal movement were reviewed in detail with the patient.  All questions were answered. Does have home bp cuff. Office bp cuff given: not applicable. Check bp twice daily, let us know if consistently >150 and/or >95. ? ?Follow-up: Return in about 1 week (around 06/26/2021) for [place]. ? ? ?Future Appointments  ?Date Time Provider [place]  ?07/01/2021  2:15 PM [place] - FTO[facility]YN  [place] [facility] None  ?07/01/2021  3:30 PM  Roma Schanz, [place] [facility]  ?07/08/2021  9:10 AM Florian Buff, MD [facility]  ?07/15/2021  9:10 AM Roma Schanz, CNM [facility]  ?07/22/2021  9:10 AM Florian Buff, MD [facility]  ?07/29/2021  9:00 AM [facility] - [facility]YN [place] [facility] None  ?07/29/2021  9:50 AM Janyth Pupa, DO [facility]  ? ? ?Orders Placed This Encounter  ?Procedures  ? POC Urinalysis Dipstick OB  ? ?Mertie Clause Ayjah Show  ?06/19/2021 ?4:35 PM ? ?

## 2021-06-26 ENCOUNTER — Encounter: Payer: Self-pay | Admitting: Obstetrics & Gynecology

## 2021-06-26 ENCOUNTER — Other Ambulatory Visit: Payer: Self-pay

## 2021-06-26 ENCOUNTER — Ambulatory Visit (INDEPENDENT_AMBULATORY_CARE_PROVIDER_SITE_OTHER): Payer: Medicaid Other | Admitting: Obstetrics & Gynecology

## 2021-06-26 VITALS — BP 123/77 | HR 84 | Wt 244.0 lb

## 2021-06-26 NOTE — Progress Notes (Signed)
? ?HIGH-RISK PREGNANCY VISIT ?Patient name: Roberta Guerrero MRN [id number]  Date of birth: 1995-09-07 ?Chief Complaint:   ?Routine Prenatal Visit ? ?History of Present Illness:   ?Roberta Guerrero is a 26 y.o. G71P0000 female at [redacted]w[redacted]d with an Estimated Date of Delivery: 08/06/21 being seen today for ongoing management of a high-risk pregnancy complicated by chronic hypertension currently on no meds.   ? ?Today she reports some right foot numbness and some occasional dizzy spells no syncope. Contractions: Irritability. Vag. Bleeding: None.  Movement: Present. denies leaking of fluid.  ? ?Depression screen [facility] 2/9 05/20/2021 01/30/2021 06/06/2020 07/14/2016  ?Decreased Interest 0 0 0 0  ?Down, Depressed, Hopeless 0 0 0 0  ?PHQ - 2 Score 0 0 0 0  ?Altered sleeping 1 1 0 -  ?Tired, decreased energy 2 3 2  -  ?Change in appetite 0 2 0 -  ?Feeling bad or failure about yourself  0 0 1 -  ?Trouble concentrating 0 0 0 -  ?Moving slowly or fidgety/restless 0 0 0 -  ?Suicidal thoughts 0 0 0 -  ?PHQ-9 Score 3 6 3  -  ? ?  ?GAD 7 : Generalized Anxiety Score 05/20/2021 01/30/2021 06/06/2020  ?Nervous, Anxious, on Edge 0 0 0  ?Control/stop worrying 0 0 0  ?Worry too much - different things 0 0 0  ?Trouble relaxing 0 0 0  ?Restless 0 0 0  ?Easily annoyed or irritable 0 0 0  ?Afraid - awful might happen 0 0 0  ?Total GAD 7 Score 0 0 0  ? ? ? ?Review of Systems:   ?Pertinent items are noted in HPI ?Denies abnormal vaginal discharge w/ itching/odor/irritation, headaches, visual changes, shortness of breath, chest pain, abdominal pain, severe nausea/vomiting, or problems with urination or bowel movements unless otherwise stated above. ?Pertinent History Reviewed:  ?Reviewed past medical,surgical, social, obstetrical and family history.  ?Reviewed problem list, medications and allergies. ?Physical Assessment:  ? ?Vitals:  ? 06/26/21 1520  ?BP: 123/77  ?Pulse: 84  ?Weight: 244 lb (110.7 kg)  ?Body mass index is 40.6 kg/m?. ?     ?     Physical  Examination:  ? General appearance: alert, well appearing, and in no distress ? Mental status: alert, oriented to person, place, and time ? Skin: warm & dry  ? Extremities: Edema: Trace  ?  Cardiovascular: normal heart rate noted ? Respiratory: normal respiratory effort, no distress ? Abdomen: gravid, soft, non-tender ? Pelvic: Cervical exam deferred        ? ?Fetal Status: Fetal Heart Rate (bpm): 145 Fundal Height: 34 cm Movement: Present   ? ?Fetal Surveillance Testing today: FHR  145 ? ?Chaperone: N/A   ? ?No results found for this or any previous visit (from the past 24 hour(s)).  ?Assessment & Plan:  ?High-risk pregnancy: G1P0000 at [redacted]w[redacted]d with an Estimated Date of Delivery: 08/06/21  ? ?1) CHTN nom meds,  ? ? ? ?Meds: No orders of the defined types were placed in this encounter. ? ? ?Labs/procedures today: none ? ?Treatment Plan:  continue current care plan with antenatal survillance ? ?Reviewed: Preterm labor symptoms and general obstetric precautions including but not limited to vaginal bleeding, contractions, leaking of fluid and fetal movement were reviewed in detail with the patient.  All questions were answered. Does have home bp cuff. Office bp cuff given: yes. Check bp daily, let us know if consistently >150 and/or >95. ? ?Follow-up: Return in about 1 week (around 07/03/2021). ? ? ?  Future Appointments  ?Date Time Provider [place]  ?07/01/2021  2:15 PM [place] - [facility]YN [place] [facility] None  ?07/01/2021  3:30 PM Roma Schanz, CNM [facility]  ?07/08/2021  9:10 AM Florian Buff, MD [facility]  ?07/15/2021  9:10 AM Roma Schanz, CNM [facility]  ?07/22/2021  9:10 AM Florian Buff, MD [facility]  ?07/29/2021  9:00 AM [facility] - [facility] [place] [facility] None  ?07/29/2021  9:50 AM Janyth Pupa, DO [facility]  ? ? ?No orders of the defined types were placed in this encounter. ? ?Florian Buff  ?06/26/2021 ?3:42 PM ? ?

## 2021-07-01 ENCOUNTER — Encounter: Payer: Self-pay | Admitting: Women's Health

## 2021-07-01 ENCOUNTER — Other Ambulatory Visit: Payer: Self-pay

## 2021-07-01 ENCOUNTER — Ambulatory Visit (INDEPENDENT_AMBULATORY_CARE_PROVIDER_SITE_OTHER): Payer: Medicaid Other | Admitting: Women's Health

## 2021-07-01 ENCOUNTER — Other Ambulatory Visit: Payer: Self-pay | Admitting: Advanced Practice Midwife

## 2021-07-01 ENCOUNTER — Ambulatory Visit (INDEPENDENT_AMBULATORY_CARE_PROVIDER_SITE_OTHER): Payer: Medicaid Other

## 2021-07-01 VITALS — BP 129/82 | HR 85 | Wt 244.0 lb

## 2021-07-01 DIAGNOSIS — Z3A34 34 weeks gestation of pregnancy: Secondary | ICD-10-CM

## 2021-07-01 DIAGNOSIS — I10 Essential (primary) hypertension: Secondary | ICD-10-CM | POA: Diagnosis not present

## 2021-07-01 DIAGNOSIS — O10919 Unspecified pre-existing hypertension complicating pregnancy, unspecified trimester: Secondary | ICD-10-CM

## 2021-07-01 DIAGNOSIS — O0993 Supervision of high risk pregnancy, unspecified, third trimester: Secondary | ICD-10-CM | POA: Diagnosis not present

## 2021-07-01 DIAGNOSIS — R8271 Bacteriuria: Secondary | ICD-10-CM

## 2021-07-01 DIAGNOSIS — Z029 Encounter for administrative examinations, unspecified: Secondary | ICD-10-CM

## 2021-07-01 LAB — POCT URINALYSIS DIPSTICK OB
Blood, UA: NEGATIVE
Glucose, UA: NEGATIVE
Ketones, UA: NEGATIVE
Leukocytes, UA: NEGATIVE
Nitrite, UA: NEGATIVE

## 2021-07-01 NOTE — Progress Notes (Signed)
[place] 34+6 wks,complete breech,cx 3.3 cm,anterior placenta gr 2,AFI 16.8 cm,FHR 148 bpm,EFW 2611 g 56% ?

## 2021-07-01 NOTE — Patient Instructions (Addendum)
T[place]ha, thank you for choosing our office today! We appreciate the opportunity to meet your healthcare needs. You may receive a short survey by mail, e-mail, or through [employer]. If you are happy with your care we would appreciate if you could take just a few minutes to complete the survey questions. We read all of your comments and take your feedback very seriously. Thank you again for choosing our office.  ?Center for [employer] Team at [facility] ? ?Women's & Children's Center at [facility] ?([address] [place], [place] [address]) ?Entrance C, located off of [address] ?Free 24/7 valet parking  ? ?[email or web address] ? ?CLASSES: Go to [email or web address] to register for classes (childbirth, breastfeeding, waterbirth, infant CPR, daddy bootcamp, etc.) ? ?Call the office [phone]) or go to [facility] if: ?You begin to have strong, frequent contractions ?Your water breaks.  Sometimes it is a big gush of fluid, sometimes it is just a trickle that keeps getting your panties wet or running down your legs ?You have vaginal bleeding.  It is normal to have a small amount of spotting if your cervix was checked.  ?You don't feel your baby moving like normal.  If you don't, get you something to eat and drink and lay down and focus on feeling your baby move.   If your baby is still not moving like normal, you should call the office or go to [facility]. ? ?Call the office [phone]) or go to [facility] hospital for these signs of pre-eclampsia: ?Severe headache that does not go away with Tylenol ?Visual changes- seeing spots, double, blurred vision ?Pain under your right breast or upper abdomen that does not go away with Tums or heartburn medicine ?Nausea and/or vomiting ?Severe swelling in your hands, feet, and face  ? ?Tdap Vaccine ?It is recommended that you get the Tdap vaccine during the third trimester of EACH pregnancy to help protect your baby from getting pertussis (whooping cough) ?27-36 weeks is  the BEST time to do this so that you can pass the protection on to your baby. During pregnancy is better than after pregnancy, but if you are unable to get it during pregnancy it will be offered at the hospital.  ?You can get this vaccine with [place], at the health department, your family doctor, or some local pharmacies ?Everyone who will be around your baby should also be up-to-date on their vaccines before the baby comes. Adults (who are not pregnant) only need 1 dose of Tdap during adulthood.  ? ?[place] Pediatricians/Family Doctors ? [facility]): [address], [phone]           ?[facility]: [address], [phone]                ?[facility]): [address], [phone] (call to ask if accepting patients) ?[facility] Department: [address], [place], [phone]   ? ?[place] Pediatricians/Family Doctors ?[facility]): [address], [phone] ?[facility]: [address] [place], [phone] ?[facility] [place]: [address], [phone] ? ?[employer] Family Doctors  ?[facility]): [phone] ?[facility]: [address], [phone]  ? ?[facility] Family Doctors ?[facility]: [address], [phone] ? ?[facility] Family Doctors  ?[facility]: [phone], [phone] ? ?Home Blood Pressure Monitoring for Patients  ? ?Your provider has recommended that you  check your blood pressure (BP) at least once a week at home. If you do not have a blood pressure cuff at home, one will be provided for you. Contact your provider if you have not received your monitor within 1 week.  ? ?Helpful Tips for Accurate Home Blood Pressure Checks  ?Don't smoke, exercise, or drink caffeine 30 minutes before checking your BP ?Use the restroom before checking your BP (a full  bladder can raise your pressure) ?Relax in a comfortable upright chair ?Feet on the ground ?Left arm resting comfortably on a flat surface at the level of your heart ?Legs uncrossed ?Back supported ?Sit quietly and don't talk ?Place the cuff on your bare arm ?Adjust snuggly, so that only two fingertips can fit between your skin and the top of the cuff ?Check 2 readings separated by at least one minute ?Keep a log of your BP readings ?For a visual, please reference this diagram: http://ccnc.care/bpdiagram ? ?Provider Name: [facility]     Phone: [phone] ? ?Zone 1: ALL CLEAR  ?Continue to monitor your symptoms:  ?BP reading is less than 140 (top number) or less than 90 (bottom number)  ?No right upper stomach pain ?No headaches or seeing spots ?No feeling nauseated or throwing up ?No swelling in face and hands ? ?Zone 2: CAUTION ?Call your doctor's office for any of the following:  ?BP reading is greater than 140 (top number) or greater than 90 (bottom number)  ?Stomach pain under your ribs in the middle or right side ?Headaches or seeing spots ?Feeling nauseated or throwing up ?Swelling in face and hands ? ?Zone 3: EMERGENCY  ?Seek immediate medical care if you have any of the following:  ?BP reading is greater than160 (top number) or greater than 110 (bottom number) ?Severe headaches not improving with Tylenol ?Serious difficulty catching your breath ?Any worsening symptoms from Zone 2  ?Preterm Labor and Birth Information ? ?The normal length of a pregnancy is 39-41 weeks. Preterm labor is when labor starts before 37 completed weeks of pregnancy. ?What are the risk factors for preterm labor? ?Preterm labor is more likely to occur in women who: ?Have certain infections during pregnancy such as a bladder infection, sexually transmitted infection, or infection inside the uterus (chorioamnionitis). ?Have a shorter-than-normal cervix. ?Have gone into preterm labor before. ?Have had surgery on their  cervix. ?Are younger than age 53 or older than age 76. ?Are African American. ?Are pregnant with twins or multiple babies (multiple gestation). ?Take street drugs or smoke while pregnant. ?Do not gain enough weight while pregnant. ?Became pregnant shortly after having been pregnant. ?What are the symptoms of preterm labor? ?Symptoms of preterm labor include: ?Cramps similar to those that can happen during a menstrual period. The cramps may happen with diarrhea. ?Pain in the abdomen or lower back. ?Regular uterine contractions that may feel like tightening of the abdomen. ?A feeling of increased pressure in the pelvis. ?Increased watery or bloody mucus discharge from the vagina. ?Water breaking (ruptured amniotic sac). ?Why is it important to recognize signs of preterm labor? ?It is important to recognize signs of preterm labor because babies who are born prematurely may not be fully developed. This can put them at an increased risk for: ?Long-term (chronic) heart and lung problems. ?Difficulty immediately after birth with regulating body systems, including blood sugar, body temperature, heart rate, and breathing rate. ?Bleeding in the brain. ?Cerebral palsy. ?Learning difficulties. ?Death. ?These risks are highest for babies who are born before  34 weeks of pregnancy. ?How is preterm labor treated? ?Treatment depends on the length of your pregnancy, your condition, and the health of your baby. It may involve: ?Having a stitch (suture) placed in your cervix to prevent your cervix from opening too early (cerclage). ?Taking or being given medicines, such as: ?Hormone medicines. These may be given early in pregnancy to help support the pregnancy. ?Medicine to stop contractions. ?Medicines to help mature the baby?s lungs. These may be prescribed if the risk of delivery is high. ?Medicines to prevent your baby from developing cerebral palsy. ?If the labor happens before 34 weeks of pregnancy, you may need to stay in the  hospital. ?What should I do if I think I am in preterm labor? ?If you think that you are going into preterm labor, call your health care provider right away. ?How can I prevent preterm labor in future pregnancies? ?T

## 2021-07-01 NOTE — Progress Notes (Signed)
? ?HIGH-RISK PREGNANCY VISIT ?Patient name: Roberta Guerrero MRN [id number]  Date of birth: 24-Feb-1996 ?Chief Complaint:   ?High Risk Gestation ([place] today; hard to breathe today) ? ?History of Present Illness:   ?Roberta Guerrero is a 26 y.o. G64P0000 female at [redacted]w[redacted]d with an Estimated Date of Delivery: 08/06/21 being seen today for ongoing management of a high-risk pregnancy complicated by chronic hypertension currently on no meds. Dx w/ superimposed pre-e 3/8 MAU visit   ? ?Today she reports  a little hard to breathe today, just feels she can't get a good deep breath . Denies ha, visual changes, ruq/epigastric pain, n/v.  Went to MAU 3/8, had mild headache not requiring meds and elevated home bp's. Had 1 severe range, then down to 150s/80s, then 140/78. Labs wnl, except LFTs 48/65, but not twice upper limit of normal. Randomly checking home bp's, last check was last Wed, 140s/90s. Contractions: Not present. Vag. Bleeding: None.  Movement: Present. denies leaking of fluid.  ? ?Depression screen [facility] 2/9 05/20/2021 01/30/2021 06/06/2020 07/14/2016  ?Decreased Interest 0 0 0 0  ?Down, Depressed, Hopeless 0 0 0 0  ?PHQ - 2 Score 0 0 0 0  ?Altered sleeping 1 1 0 -  ?Tired, decreased energy 2 3 2  -  ?Change in appetite 0 2 0 -  ?Feeling bad or failure about yourself  0 0 1 -  ?Trouble concentrating 0 0 0 -  ?Moving slowly or fidgety/restless 0 0 0 -  ?Suicidal thoughts 0 0 0 -  ?PHQ-9 Score 3 6 3  -  ? ?  ?GAD 7 : Generalized Anxiety Score 05/20/2021 01/30/2021 06/06/2020  ?Nervous, Anxious, on Edge 0 0 0  ?Control/stop worrying 0 0 0  ?Worry too much - different things 0 0 0  ?Trouble relaxing 0 0 0  ?Restless 0 0 0  ?Easily annoyed or irritable 0 0 0  ?Afraid - awful might happen 0 0 0  ?Total GAD 7 Score 0 0 0  ? ? ? ?Review of Systems:   ?Pertinent items are noted in HPI ?Denies abnormal vaginal discharge w/ itching/odor/irritation, headaches, visual changes, shortness of breath, chest pain, abdominal pain, severe nausea/vomiting, or  problems with urination or bowel movements unless otherwise stated above. ?Pertinent History Reviewed:  ?Reviewed past medical,surgical, social, obstetrical and family history.  ?Reviewed problem list, medications and allergies. ?Physical Assessment:  ? ?Vitals:  ? 07/01/21 1533  ?BP: 129/82  ?Pulse: 85  ?Weight: 244 lb (110.7 kg)  ?Body mass index is 40.6 kg/m?Marland Kitchen ?Pulse ox 98% RA ?     ?     Physical Examination:  ? General appearance: alert, well appearing, and in no distress ? Mental status: alert, oriented to person, place, and time ? Skin: warm & dry  ? Extremities: Edema: Trace  ?  Cardiovascular: normal heart rate & rhythm noted ? Respiratory: normal respiratory effort, no distress, LCTAB ? Abdomen: gravid, soft, non-tender ? Pelvic: Cervical exam deferred        ? ?Fetal Status: Fetal Heart Rate (bpm): 148 u/s   Movement: Present Presentation: Complete Breech ? ?Fetal Surveillance Testing today: [place] 34+6 wks,complete breech,cx 3.3 cm,anterior placenta gr 2,AFI 16.8 cm,FHR 148 bpm,EFW 2611 g 56% ? ?Chaperone: N/A   ? ?Results for orders placed or performed in visit on 07/01/21 (from the past 24 hour(s))  ?POC Urinalysis Dipstick OB  ? Collection Time: 07/01/21  3:29 PM  ?Result Value Ref Range  ? Color, UA    ? Clarity,  UA    ? Glucose, UA Negative Negative  ? Bilirubin, UA    ? Ketones, UA neg   ? Spec Grav, UA    ? Blood, UA neg   ? pH, UA    ? POC,PROTEIN,UA Trace Negative, Trace, Small (1+), Moderate (2+), Large (3+), 4+  ? Urobilinogen, UA    ? Nitrite, UA neg   ? Leukocytes, UA Negative Negative  ? Appearance    ? Odor    ?  ?Assessment & Plan:  ?High-risk pregnancy: G1P0000 at [redacted]w[redacted]d with an Estimated Date of Delivery: 08/06/21  ? ?1) CHTN, stable on no meds, continue ASA ? ?2) Superimposed pre-e dx 3/8 MAU visit, had 1 severe range bp then all non-severe range, labs normal except LFTs 48/65 (but not twice upper limit), P:C 0.20, mild headache that didn't require meds. BP normal today and asymptomatic.  Will check pre-e labs today. Pt to check bp at least daily, if >160/110 or develops severe sx, let us know/go to [facility] ? ?3) Breech presentation> [email or web address], recheck @ 36wks ? ?4) Hard to get deep breath> normal exam, no distress, pulse ox 98% RA, LCTAB, has asthma- use inhaler prn, if worsens let us know ? ?Meds: No orders of the defined types were placed in this encounter. ? ? ?Labs/procedures today: U/S ? ?Treatment Plan:  2x/wk testing nst alt w/ bpp/dopp ? ?Reviewed: Preterm labor symptoms and general obstetric precautions including but not limited to vaginal bleeding, contractions, leaking of fluid and fetal movement were reviewed in detail with the patient.  All questions were answered. Does have home bp cuff. Office bp cuff given: not applicable. Check bp daily, let us know if consistently >160 and/or >110. ? ?Follow-up: Return for fri nst/nurse, tues bpp/dopp w/ hrob w/ md/cnm til delivery. ? ? ?Future Appointments  ?Date Time Provider [place]  ?07/04/2021 11:50 AM [facility] NURSE [facility]  ?07/08/2021  9:10 AM Florian Buff, MD [facility]  ?07/15/2021  9:10 AM Roma Schanz, CNM [facility]  ?07/22/2021  9:10 AM Florian Buff, MD [facility]  ?07/29/2021  9:00 AM [facility] [place] [facility] None  ?07/29/2021  9:50 AM Janyth Pupa, DO [facility]  ? ? ?Orders Placed This Encounter  ?Procedures  ? CBC  ? Comprehensive metabolic panel  ? Protein / creatinine ratio, urine  ? POC Urinalysis Dipstick OB  ? ?[place], [place] ?07/01/2021 ?6:08 PM  ?

## 2021-07-02 LAB — COMPREHENSIVE METABOLIC PANEL
ALT: 75 IU/L — ABNORMAL HIGH (ref 0–32)
AST: 55 IU/L — ABNORMAL HIGH (ref 0–40)
Albumin/Globulin Ratio: 1.4 (ref 1.2–2.2)
Albumin: 3.6 g/dL — ABNORMAL LOW (ref 3.9–5.0)
Alkaline Phosphatase: 89 IU/L (ref 44–121)
BUN/Creatinine Ratio: 14 (ref 9–23)
BUN: 9 mg/dL (ref 6–20)
Bilirubin Total: <0.2 mg/dL (ref 0.0–1.2)
CO2: 21 mmol/L (ref 20–29)
Calcium: 8.1 mg/dL — ABNORMAL LOW (ref 8.7–10.2)
Chloride: 103 mmol/L (ref 96–106)
Creatinine, Ser: 0.66 mg/dL (ref 0.57–1.00)
Globulin, Total: 2.6 g/dL (ref 1.5–4.5)
Glucose: 102 mg/dL — ABNORMAL HIGH (ref 70–99)
Potassium: 3.9 mmol/L (ref 3.5–5.2)
Sodium: 137 mmol/L (ref 134–144)
Total Protein: 6.2 g/dL (ref 6.0–8.5)
eGFR: 125 mL/min/{1.73_m2} (ref >59)

## 2021-07-02 LAB — CBC
Hematocrit: 30.4 % — ABNORMAL LOW (ref 34.0–46.6)
Hemoglobin: 10 g/dL — ABNORMAL LOW (ref 11.1–15.9)
MCH: 27.6 pg (ref 26.6–33.0)
MCHC: 32.9 g/dL (ref 31.5–35.7)
MCV: 84 fL (ref 79–97)
Platelets: 226 10*3/uL (ref 150–450)
RBC: 3.62 x10E6/uL — ABNORMAL LOW (ref 3.77–5.28)
RDW: 12.1 % (ref 11.7–15.4)
WBC: 8.5 10*3/uL (ref 3.4–10.8)

## 2021-07-02 LAB — PROTEIN / CREATININE RATIO, URINE
Creatinine, Urine: 254.3 mg/dL
Protein, Ur: 39.4 mg/dL
Protein/Creat Ratio: 155 mg/g creat (ref 0–200)

## 2021-07-03 ENCOUNTER — Other Ambulatory Visit: Payer: Self-pay | Admitting: Advanced Practice Midwife

## 2021-07-03 ENCOUNTER — Encounter: Payer: Self-pay | Admitting: *Deleted

## 2021-07-03 MED ORDER — ALBUTEROL SULFATE HFA 108 (90 BASE) MCG/ACT IN AERS
2.0000 | INHALATION_SPRAY | Freq: Four times a day (QID) | RESPIRATORY_TRACT | 3 refills | Status: DC | PRN
Start: 2021-07-03 — End: 2021-11-17

## 2021-07-04 ENCOUNTER — Ambulatory Visit (INDEPENDENT_AMBULATORY_CARE_PROVIDER_SITE_OTHER): Payer: Medicaid Other | Admitting: *Deleted

## 2021-07-04 ENCOUNTER — Other Ambulatory Visit: Payer: Self-pay

## 2021-07-04 VITALS — BP 127/83 | HR 91 | Wt 247.0 lb

## 2021-07-04 DIAGNOSIS — I10 Essential (primary) hypertension: Secondary | ICD-10-CM | POA: Diagnosis not present

## 2021-07-04 DIAGNOSIS — O0993 Supervision of high risk pregnancy, unspecified, third trimester: Secondary | ICD-10-CM

## 2021-07-04 LAB — POCT URINALYSIS DIPSTICK OB
Blood, UA: NEGATIVE
Glucose, UA: NEGATIVE
Ketones, UA: NEGATIVE
Leukocytes, UA: NEGATIVE
Nitrite, UA: NEGATIVE

## 2021-07-04 NOTE — Progress Notes (Signed)
? ?  NURSE VISIT- NST ? ?SUBJECTIVE:  ?Roberta Guerrero is a 26 y.o. G1P0000 female at [redacted]w[redacted]d, here for a NST for pregnancy complicated by [facility] and Pre-eclampsia.  She reports active fetal movement, contractions: none, vaginal bleeding: none, membranes: intact. Also reports headache (has not taken Tylenol), "weird vision". ? ?OBJECTIVE:  ?BP 127/83   Pulse 91   Wt 247 lb (112 kg)   LMP 10/30/2020 (Exact Date)   BMI 41.10 kg/m?   ?Appears well, no apparent distress ? ?Results for orders placed or performed in visit on 07/04/21 (from the past 24 hour(s))  ?POC Urinalysis Dipstick OB  ? Collection Time: 07/04/21  1:03 PM  ?Result Value Ref Range  ? Color, UA    ? Clarity, UA    ? Glucose, UA Negative Negative  ? Bilirubin, UA    ? Ketones, UA neg   ? Spec Grav, UA    ? Blood, UA neg   ? pH, UA    ? POC,PROTEIN,UA Small (1+) Negative, Trace, Small (1+), Moderate (2+), Large (3+), 4+  ? Urobilinogen, UA    ? Nitrite, UA neg   ? Leukocytes, UA Negative Negative  ? Appearance    ? Odor    ? ? ?NST: FHR baseline 140 bpm, Variability: moderate, Accelerations:present, Decelerations:  Absent= Cat 1/reactive ?Toco: none  ? ?ASSESSMENT: ?G1P0000 at [redacted]w[redacted]d with CHTN and Pre-eclampsia ?NST reactive ? ?PLAN: ?EFM strip reviewed by Dr. Charlotta Newton   ?Advised patient to take Tylenol and if headache, unresolved, then needs to go to MAU or if blood pressure worsens.  ?Recommendations: keep next appointment as scheduled.  Ok to not return to work today. Note given. ? ?Jobe Marker  ?07/04/2021 ?1:06 PM ? ?

## 2021-07-07 ENCOUNTER — Telehealth: Payer: Self-pay | Admitting: *Deleted

## 2021-07-07 ENCOUNTER — Encounter (HOSPITAL_COMMUNITY): Payer: Self-pay | Admitting: Obstetrics and Gynecology

## 2021-07-07 ENCOUNTER — Inpatient Hospital Stay (HOSPITAL_COMMUNITY)
Admission: AD | Admit: 2021-07-07 | Discharge: 2021-07-12 | DRG: 807 | Disposition: A | Discharge status: Home / Self Care | Payer: Medicaid Other | Attending: Obstetrics and Gynecology | Admitting: Obstetrics and Gynecology

## 2021-07-07 ENCOUNTER — Other Ambulatory Visit: Payer: Self-pay | Admitting: Women's Health

## 2021-07-07 ENCOUNTER — Other Ambulatory Visit: Payer: Self-pay

## 2021-07-07 DIAGNOSIS — J4520 Mild intermittent asthma, uncomplicated: Secondary | ICD-10-CM | POA: Diagnosis present

## 2021-07-07 DIAGNOSIS — R8271 Bacteriuria: Secondary | ICD-10-CM | POA: Diagnosis present

## 2021-07-07 DIAGNOSIS — O99214 Obesity complicating childbirth: Secondary | ICD-10-CM | POA: Diagnosis present

## 2021-07-07 DIAGNOSIS — O1002 Pre-existing essential hypertension complicating childbirth: Secondary | ICD-10-CM | POA: Diagnosis present

## 2021-07-07 DIAGNOSIS — I10 Essential (primary) hypertension: Secondary | ICD-10-CM | POA: Diagnosis present

## 2021-07-07 DIAGNOSIS — O99824 Streptococcus B carrier state complicating childbirth: Secondary | ICD-10-CM | POA: Diagnosis present

## 2021-07-07 DIAGNOSIS — O114 Pre-existing hypertension with pre-eclampsia, complicating childbirth: Secondary | ICD-10-CM | POA: Diagnosis present

## 2021-07-07 DIAGNOSIS — Z7982 Long term (current) use of aspirin: Secondary | ICD-10-CM | POA: Diagnosis not present

## 2021-07-07 DIAGNOSIS — Z87891 Personal history of nicotine dependence: Secondary | ICD-10-CM | POA: Diagnosis not present

## 2021-07-07 DIAGNOSIS — O10919 Unspecified pre-existing hypertension complicating pregnancy, unspecified trimester: Secondary | ICD-10-CM

## 2021-07-07 DIAGNOSIS — O99820 Streptococcus B carrier state complicating pregnancy: Secondary | ICD-10-CM | POA: Diagnosis not present

## 2021-07-07 DIAGNOSIS — O1413 Severe pre-eclampsia, third trimester: Secondary | ICD-10-CM | POA: Diagnosis present

## 2021-07-07 DIAGNOSIS — Z3A35 35 weeks gestation of pregnancy: Secondary | ICD-10-CM

## 2021-07-07 DIAGNOSIS — O9952 Diseases of the respiratory system complicating childbirth: Secondary | ICD-10-CM | POA: Diagnosis present

## 2021-07-07 DIAGNOSIS — O1414 Severe pre-eclampsia complicating childbirth: Secondary | ICD-10-CM | POA: Diagnosis not present

## 2021-07-07 DIAGNOSIS — O0993 Supervision of high risk pregnancy, unspecified, third trimester: Principal | ICD-10-CM

## 2021-07-07 DIAGNOSIS — O0990 Supervision of high risk pregnancy, unspecified, unspecified trimester: Secondary | ICD-10-CM

## 2021-07-07 LAB — COMPREHENSIVE METABOLIC PANEL
ALT: 49 U/L — ABNORMAL HIGH (ref 0–44)
AST: 32 U/L (ref 15–41)
Albumin: 2.7 g/dL — ABNORMAL LOW (ref 3.5–5.0)
Alkaline Phosphatase: 78 U/L (ref 38–126)
Anion gap: 6 (ref 5–15)
BUN: 8 mg/dL (ref 6–20)
CO2: 20 mmol/L — ABNORMAL LOW (ref 22–32)
Calcium: 8.2 mg/dL — ABNORMAL LOW (ref 8.9–10.3)
Chloride: 110 mmol/L (ref 98–111)
Creatinine, Ser: 0.65 mg/dL (ref 0.44–1.00)
GFR, Estimated: >60 mL/min (ref >60)
Glucose, Bld: 75 mg/dL (ref 70–99)
Potassium: 3.8 mmol/L (ref 3.5–5.1)
Sodium: 136 mmol/L (ref 135–145)
Total Bilirubin: 0.3 mg/dL (ref 0.3–1.2)
Total Protein: 6.2 g/dL — ABNORMAL LOW (ref 6.5–8.1)

## 2021-07-07 LAB — CBC
HCT: 31.4 % — ABNORMAL LOW (ref 36.0–46.0)
Hemoglobin: 10.6 g/dL — ABNORMAL LOW (ref 12.0–15.0)
MCH: 28.1 pg (ref 26.0–34.0)
MCHC: 33.8 g/dL (ref 30.0–36.0)
MCV: 83.3 fL (ref 80.0–100.0)
Platelets: 204 10*3/uL (ref 150–400)
RBC: 3.77 MIL/uL — ABNORMAL LOW (ref 3.87–5.11)
RDW: 12.6 % (ref 11.5–15.5)
WBC: 8.7 10*3/uL (ref 4.0–10.5)
nRBC: 0 % (ref 0.0–0.2)

## 2021-07-07 LAB — TYPE AND SCREEN
ABO/RH(D): O POS
Antibody Screen: NEGATIVE

## 2021-07-07 LAB — URINALYSIS, ROUTINE W REFLEX MICROSCOPIC
Bilirubin Urine: NEGATIVE
Glucose, UA: NEGATIVE mg/dL
Hgb urine dipstick: NEGATIVE
Ketones, ur: NEGATIVE mg/dL
Leukocytes,Ua: NEGATIVE
Nitrite: NEGATIVE
Protein, ur: NEGATIVE mg/dL
Specific Gravity, Urine: 1.018 (ref 1.005–1.030)
pH: 5 (ref 5.0–8.0)

## 2021-07-07 LAB — PROTEIN / CREATININE RATIO, URINE
Creatinine, Urine: 104.55 mg/dL
Protein Creatinine Ratio: 0.24 mg/mg{Cre} — ABNORMAL HIGH (ref 0.00–0.15)
Total Protein, Urine: 25 mg/dL

## 2021-07-07 MED ORDER — OXYTOCIN-SODIUM CHLORIDE 30-0.9 UT/500ML-% IV SOLN
2.5000 [IU]/h | INTRAVENOUS | Status: DC
  Administered 2021-07-10: 2.5 [IU]/h via INTRAVENOUS
  Filled 2021-07-07 (×3): qty 500

## 2021-07-07 MED ORDER — MAGNESIUM SULFATE 40 GM/1000ML IV SOLN
2.0000 g/h | INTRAVENOUS | Status: AC
  Administered 2021-07-07 – 2021-07-10 (×5): 2 g/h via INTRAVENOUS
  Filled 2021-07-07 (×4): qty 1000

## 2021-07-07 MED ORDER — OXYTOCIN BOLUS FROM INFUSION
333.0000 mL | Freq: Once | INTRAVENOUS | Status: AC
  Administered 2021-07-10: 333 mL via INTRAVENOUS

## 2021-07-07 MED ORDER — ONDANSETRON HCL 4 MG/2ML IJ SOLN
4.0000 mg | Freq: Four times a day (QID) | INTRAMUSCULAR | Status: DC | PRN
  Administered 2021-07-09: 4 mg via INTRAVENOUS
  Filled 2021-07-07: qty 2

## 2021-07-07 MED ORDER — LIDOCAINE HCL (PF) 1 % IJ SOLN: 30.0000 mL | INTRAMUSCULAR | Status: DC | PRN

## 2021-07-07 MED ORDER — FENTANYL CITRATE (PF) 100 MCG/2ML IJ SOLN
100.0000 ug | INTRAMUSCULAR | Status: DC | PRN
  Administered 2021-07-08 – 2021-07-09 (×4): 100 ug via INTRAVENOUS
  Filled 2021-07-07 (×4): qty 2

## 2021-07-07 MED ORDER — LACTATED RINGERS IV SOLN: 500.0000 mL | INTRAVENOUS | Status: DC | PRN

## 2021-07-07 MED ORDER — MISOPROSTOL 50MCG HALF TABLET
50.0000 ug | ORAL_TABLET | ORAL | Status: DC | PRN
  Administered 2021-07-07 – 2021-07-08 (×5): 50 ug via BUCCAL
  Filled 2021-07-07 (×5): qty 1

## 2021-07-07 MED ORDER — FLEET ENEMA 7-19 GM/118ML RE ENEM: 1.0000 | ENEMA | RECTAL | Status: DC | PRN

## 2021-07-07 MED ORDER — ACETAMINOPHEN 500 MG PO TABS
1000.0000 mg | ORAL_TABLET | Freq: Once | ORAL | Status: AC
  Administered 2021-07-07: 1000 mg via ORAL
  Filled 2021-07-07: qty 2

## 2021-07-07 MED ORDER — MAGNESIUM SULFATE BOLUS VIA INFUSION
4.0000 g | Freq: Once | INTRAVENOUS | Status: AC
  Administered 2021-07-07: 4 g via INTRAVENOUS
  Filled 2021-07-07: qty 1000

## 2021-07-07 MED ORDER — SODIUM CHLORIDE 0.9 % IV SOLN: 250.0000 mL | INTRAVENOUS | Status: DC | PRN

## 2021-07-07 MED ORDER — SODIUM CHLORIDE 0.9% FLUSH: 3.0000 mL | INTRAVENOUS | Status: DC | PRN

## 2021-07-07 MED ORDER — TERBUTALINE SULFATE 1 MG/ML IJ SOLN: 0.2500 mg | Freq: Once | INTRAMUSCULAR | Status: DC | PRN

## 2021-07-07 MED ORDER — METOCLOPRAMIDE HCL 10 MG PO TABS
10.0000 mg | ORAL_TABLET | Freq: Once | ORAL | Status: AC
  Administered 2021-07-07: 10 mg via ORAL
  Filled 2021-07-07: qty 1

## 2021-07-07 MED ORDER — SODIUM CHLORIDE 0.9% FLUSH: 3.0000 mL | Freq: Two times a day (BID) | INTRAVENOUS | Status: DC

## 2021-07-07 MED ORDER — LACTATED RINGERS IV SOLN: INTRAVENOUS | Status: DC

## 2021-07-07 MED ORDER — ACETAMINOPHEN 325 MG PO TABS
650.0000 mg | ORAL_TABLET | ORAL | Status: DC | PRN
  Administered 2021-07-09: 650 mg via ORAL
  Filled 2021-07-07: qty 2

## 2021-07-07 MED ORDER — OXYCODONE-ACETAMINOPHEN 5-325 MG PO TABS: 2.0000 | ORAL_TABLET | ORAL | Status: DC | PRN

## 2021-07-07 MED ORDER — OXYCODONE-ACETAMINOPHEN 5-325 MG PO TABS: 1.0000 | ORAL_TABLET | ORAL | Status: DC | PRN

## 2021-07-07 MED ORDER — PENICILLIN G POT IN DEXTROSE 60000 UNIT/ML IV SOLN
3.0000 10*6.[IU] | INTRAVENOUS | Status: DC
  Administered 2021-07-07 – 2021-07-09 (×13): 3 10*6.[IU] via INTRAVENOUS
  Filled 2021-07-07 (×15): qty 50

## 2021-07-07 MED ORDER — SODIUM CHLORIDE 0.9 % IV SOLN
5.0000 10*6.[IU] | Freq: Once | INTRAVENOUS | Status: AC
  Administered 2021-07-07: 5 10*6.[IU] via INTRAVENOUS
  Filled 2021-07-07: qty 5

## 2021-07-07 MED ORDER — SOD CITRATE-CITRIC ACID 500-334 MG/5ML PO SOLN: 30.0000 mL | ORAL | Status: DC | PRN

## 2021-07-07 MED ORDER — MAGNESIUM SULFATE 40 GM/1000ML IV SOLN
INTRAVENOUS | Status: AC
  Filled 2021-07-07: qty 1000

## 2021-07-07 MED ORDER — ALBUTEROL SULFATE (2.5 MG/3ML) 0.083% IN NEBU: 3.0000 mL | INHALATION_SOLUTION | Freq: Four times a day (QID) | RESPIRATORY_TRACT | Status: DC | PRN

## 2021-07-07 MED ORDER — CAFFEINE 200 MG PO TABS
200.0000 mg | ORAL_TABLET | Freq: Once | ORAL | Status: AC
  Administered 2021-07-07: 200 mg via ORAL
  Filled 2021-07-07: qty 1

## 2021-07-07 NOTE — MAU Note (Signed)
...  Roberta Guerrero is a 26 y.o. at [redacted]w[redacted]d here in MAU reporting: Elevated BP at home of 165/95 while getting ready for work around 1020 this morning. She is also endorsing a HA, visual floaters, and swelling of her hands. She states her HA began this morning around 0930 when she woke up. Denies RUQ/epigastric pain, VB, or LOF. +FM.  ? ?Pain score:  ?7/10 HA - entire head ? ?FHT: 160 initial external ? ? ?

## 2021-07-07 NOTE — H&P (Addendum)
OBSTETRIC ADMISSION HISTORY AND PHYSICAL ? ?Roberta Guerrero is a 26 y.o. female G1P0000 with IUP at 3142w5d by LMP c/w 8 week US presenting for IOL for cHTN w/ super-imposed pre-eclampsia with severe features. She was diagnosed with superimposed pre-eclampsia on 3/8 in the MAU. This morning she presented to the MAU with 7/10 headache, visual floaters, and swelling of her hands. BP has been elevated on admission to 156/97.  ? ?She reports +FMs, No LOF, no VB,  and no RUQ pain.  She plans on breast feeding. She declines birth control at this time. ?[facility]  ? ?Dating: By LMP c/w 8 wk US --->  Estimated Date of Delivery: 08/06/21 ? ?Sono:   ? ?@[redacted]w[redacted]d , CWD, normal anatomy, breech presentation, anterior placental lie, 2611g, 56% EFW ? ? ?Prenatal History/Complications:  ?Chronic Hypertension ?Superimposed Pre-eclampsia with severe features ?Mild Intermittent Asthma ?GBS Bacteriuria  ? ? ?Past Medical History: ?Past Medical History:  ?Diagnosis Date  ? Allergic rhinitis   ? Asthma   ? Headache   ? History of chlamydia   ? Hypertension   ? Menstrual migraine   ? ? ?Past Surgical History: ?Past Surgical History:  ?Procedure Laterality Date  ? CHOLECYSTECTOMY    ? MOUTH SURGERY    ? SKIN BIOPSY    ? ? ?Obstetrical History: ?OB History   ? ? Gravida  ?1  ? Para  ?0  ? Term  ?0  ? Preterm  ?0  ? AB  ?0  ? Living  ?0  ?  ? ? SAB  ?0  ? IAB  ?0  ? Ectopic  ?0  ? Multiple  ?0  ? Live Births  ?0  ?   ?  ?  ? ? ?Social History ?Social History  ? ?Socioeconomic History  ? Marital status: Single  ?  Spouse name: Not on file  ? Number of children: Not on file  ? Years of education: Not on file  ? Highest education level: Not on file  ?Occupational History  ? Not on file  ?Tobacco Use  ? Smoking status: Former  ?  Types: Cigars  ? Smokeless tobacco: Never  ?Vaping Use  ? Vaping Use: Never used  ?Substance and Sexual Activity  ? Alcohol use: Not Currently  ?  Comment: occasional  ? Drug use: Not  Currently  ?  Types: Marijuana  ? Sexual activity: Yes  ?  Birth control/protection: None  ?Other Topics Concern  ? Not on file  ?Social History Narrative  ? Not on file  ? ?Social Determinants of Health  ? ?Financial Resource Strain: Low Risk   ? Difficulty of Paying Living Expenses: Not hard at all  ?Food Insecurity: Food Insecurity Present  ? Worried About Programme researcher, broadcasting/film/videounning Out of Food in the Last Year: Sometimes true  ? Ran Out of Food in the Last Year: Sometimes true  ?Transportation Needs: No Transportation Needs  ? Lack of Transportation (Medical): No  ? Lack of Transportation (Non-Medical): No  ?Physical Activity: Sufficiently Active  ? Days of Exercise per Week: 4 days  ? Minutes of Exercise per Session: 60 min  ?Stress: No Stress Concern Present  ? Feeling of Stress : Not at all  ?Social Connections: Moderately Integrated  ? Frequency of Communication with Friends and Family: More than three times a week  ? Frequency of Social Gatherings with Friends and Family: More than three times a week  ? Attends Religious Services: More than  4 times per year  ? Active Member of Clubs or Organizations: No  ? Attends Banker Meetings: 1 to 4 times per year  ? Marital Status: Never married  ? ? ?Family History: ?Family History  ?Problem Relation Age of Onset  ? Hypertension Mother   ? Hypertension Father   ? Hyperlipidemia Father   ? Autism Brother   ? Autism Brother   ? Hypertension Maternal Uncle   ? Hypertension Maternal Grandmother   ? Alcohol abuse Maternal Grandfather   ? ? ?Allergies: ?Allergies  ?Allergen Reactions  ? Corn-Containing Products Rash  ? Other Rash  ?  Nuts  ? ? ?Medications Prior to Admission  ?Medication Sig Dispense Refill Last Dose  ? albuterol (VENTOLIN HFA) 108 (90 Base) MCG/ACT inhaler Inhale 2 puffs into the lungs every 6 (six) hours as needed for wheezing or shortness of breath. 18 g 3 Past Week  ? aspirin EC 81 MG tablet Take 2 tablets (162 mg total) by mouth daily. 60 tablet 6  07/06/2021  ? ferrous sulfate 325 (65 FE) MG tablet Take 1 tablet (325 mg total) by mouth every other day. 45 tablet 2 Past Week  ? budesonide (PULMICORT FLEXHALER) 180 MCG/ACT inhaler Inhale 1 puff into the lungs in the morning and at bedtime. (Patient not taking: Reported on 05/20/2021) 1 each 5   ? Spacer/Aero-Holding Chambers (AEROCHAMBER PLUS) inhaler Use with inhaler 1 each 2   ? triamcinolone ointment (KENALOG) 0.1 % Apply 1 application topically 2 (two) times daily. (Patient not taking: Reported on 07/04/2021) 454 g 1   ? ? ? ?Review of Systems  ? ?All systems reviewed and negative except as stated in HPI ? ?Blood pressure (!) 147/77, pulse (!) 102, temperature 98 ?F (36.7 ?C), temperature source Oral, resp. rate 16, height 5\' 5"  (1.651 m), weight 112.1 kg, last menstrual period 10/30/2020, SpO2 99 %. ?General appearance: alert, cooperative, and appears stated age ?Lungs: normal work of breathing on room air ?Heart: normal rate, peripheral perfusion intact ?Abdomen: soft, non-tender; gravid ?Extremities: no lower extremity edema, no calf tenderness ?Presentation: cephalic via bedside 11/01/2020 ?Fetal monitoring: Baseline: 130 bpm, Variability: Good {> 6 bpm), Accelerations: Reactive, and Decelerations: Absent ?Uterine activity: Frequency: Occasional ?Dilation: Closed ?Effacement (%): Thick ?Station: -3 ?Exam by:: Dr. [email or web address] ? ? ?Prenatal labs: ?ABO, Rh: --/--/PENDING (03/27 1134) ?Antibody: PENDING (03/27 1134) ?Rubella: 2.50 (10/20 1045) ?RPR: Non Reactive (02/07 0834)  ?HBsAg: Negative (10/20 1045)  ?HIV: Non Reactive (02/07 0834)  ?GBS: Positive/-- (10/20 0000)  ?2 hr Glucola normal ?Genetic screening  NT/IT negative, Panorama low-risk female ?Anatomy 08-02-1972: isolated EICF, resolved by [redacted]w[redacted]d ? ?Prenatal Transfer Tool  ?Maternal Diabetes: No ?Genetic Screening: Normal ?Maternal Ultrasounds/Referrals: Isolated EIF (echogenic intracardiac focus) ?Fetal Ultrasounds or other Referrals:  None ?Maternal Substance Abuse:   No ?Significant Maternal Medications:  None ?Significant Maternal Lab Results: Group B Strep positive ? ?Results for orders placed or performed during the hospital encounter of 07/07/21 (from the past 24 hour(s))  ?CBC  ? Collection Time: 07/07/21 11:34 AM  ?Result Value Ref Range  ? WBC 8.7 4.0 - 10.5 K/uL  ? RBC 3.77 (L) 3.87 - 5.11 MIL/uL  ? Hemoglobin 10.6 (L) 12.0 - 15.0 g/dL  ? HCT 31.4 (L) 36.0 - 46.0 %  ? MCV 83.3 80.0 - 100.0 fL  ? MCH 28.1 26.0 - 34.0 pg  ? MCHC 33.8 30.0 - 36.0 g/dL  ? RDW 12.6 11.5 - 15.5 %  ? Platelets 204 150 -  400 K/uL  ? nRBC 0.0 0.0 - 0.2 %  ?Comprehensive metabolic panel  ? Collection Time: 07/07/21 11:34 AM  ?Result Value Ref Range  ? Sodium 136 135 - 145 mmol/L  ? Potassium 3.8 3.5 - 5.1 mmol/L  ? Chloride 110 98 - 111 mmol/L  ? CO2 20 (L) 22 - 32 mmol/L  ? Glucose, Bld 75 70 - 99 mg/dL  ? BUN 8 6 - 20 mg/dL  ? Creatinine, Ser 0.65 0.44 - 1.00 mg/dL  ? Calcium 8.2 (L) 8.9 - 10.3 mg/dL  ? Total Protein 6.2 (L) 6.5 - 8.1 g/dL  ? Albumin 2.7 (L) 3.5 - 5.0 g/dL  ? AST 32 15 - 41 U/L  ? ALT 49 (H) 0 - 44 U/L  ? Alkaline Phosphatase 78 38 - 126 U/L  ? Total Bilirubin 0.3 0.3 - 1.2 mg/dL  ? GFR, Estimated >60 >60 mL/min  ? Anion gap 6 5 - 15  ?Type and screen [facility]  ? Collection Time: 07/07/21 11:34 AM  ?Result Value Ref Range  ? ABO/RH(D) PENDING   ? Antibody Screen PENDING   ? Sample Expiration    ?  07/10/2021,2359 ?Performed at [facility], [address], [place], [place] [address] ?  ?Protein / creatinine ratio, urine  ? Collection Time: 07/07/21 11:40 AM  ?Result Value Ref Range  ? Creatinine, Urine 104.55 mg/dL  ? Total Protein, Urine 25 mg/dL  ? Protein Creatinine Ratio 0.24 (H) 0.00 - 0.15 mg/mg[Cre]  ?Urinalysis, Routine w reflex microscopic  ? Collection Time: 07/07/21 11:40 AM  ?Result Value Ref Range  ? Color, Urine YELLOW YELLOW  ? APPearance HAZY (A) CLEAR  ? Specific Gravity, Urine 1.018 1.005 - 1.030  ? pH 5.0 5.0 - 8.0  ? Glucose, UA  NEGATIVE NEGATIVE mg/dL  ? Hgb urine dipstick NEGATIVE NEGATIVE  ? Bilirubin Urine NEGATIVE NEGATIVE  ? Ketones, ur NEGATIVE NEGATIVE mg/dL  ? Protein, ur NEGATIVE NEGATIVE mg/dL  ? Nitrite NEGATIVE NEGATIVE  ? Leukocytes,Ua

## 2021-07-07 NOTE — MAU Note (Signed)
Phlebotomy at bedside.

## 2021-07-07 NOTE — Telephone Encounter (Signed)
Patient states her BP this morning was 163/95.  States she has a headache as well along with her face and arms "feeling weird". Advised patient to go to [facility] for evaluation. Pt agreeable to go.   ?

## 2021-07-07 NOTE — MAU Provider Note (Signed)
?History  ?  ? ?CSN: [id number] ? ?Arrival date and time: 07/07/21 1112 ? ? Event Date/Time  ? First Provider Initiated Contact with Patient 07/07/21 1147   ?  ? ?Chief Complaint  ?Patient presents with  ? Headache  ? Hypertension  ? ?HPI ?Roberta Guerrero is a 26 y.o. G1P0000 at [redacted]w[redacted]d who presents for evaluation of headache and elevated blood pressures. She states she woke up this morning with a headache. She rates the headache a 7/10 and has not taken anything for the pain. She has not eaten today and has had one bottle of water. She reports she checked her blood pressure and it was 160s/100s. She reports some visual changes when she looks to the side. She denies any right upper quadrant pain. She denies any abdominal pain, vaginal bleeding or discharge. She reports normal fetal movement.  ? ?OB History   ? ? Gravida  ?1  ? Para  ?0  ? Term  ?0  ? Preterm  ?0  ? AB  ?0  ? Living  ?0  ?  ? ? SAB  ?0  ? IAB  ?0  ? Ectopic  ?0  ? Multiple  ?0  ? Live Births  ?0  ?   ?  ?  ? ? ?Past Medical History:  ?Diagnosis Date  ? Allergic rhinitis   ? Asthma   ? Headache   ? History of chlamydia   ? Hypertension   ? Menstrual migraine   ? ? ?Past Surgical History:  ?Procedure Laterality Date  ? CHOLECYSTECTOMY    ? MOUTH SURGERY    ? SKIN BIOPSY    ? ? ?Family History  ?Problem Relation Age of Onset  ? Hypertension Mother   ? Hypertension Father   ? Hyperlipidemia Father   ? Autism Brother   ? Autism Brother   ? Hypertension Maternal Uncle   ? Hypertension Maternal Grandmother   ? Alcohol abuse Maternal Grandfather   ? ? ?Social History  ? ?Tobacco Use  ? Smoking status: Former  ?  Types: Cigars  ? Smokeless tobacco: Never  ?Vaping Use  ? Vaping Use: Never used  ?Substance Use Topics  ? Alcohol use: Not Currently  ?  Comment: occasional  ? Drug use: Not Currently  ?  Types: Marijuana  ? ? ?Allergies:  ?Allergies  ?Allergen Reactions  ? Corn-Containing Products Rash  ? Other Rash  ?  Nuts  ? ? ?Medications Prior to Admission   ?Medication Sig Dispense Refill Last Dose  ? albuterol (VENTOLIN HFA) 108 (90 Base) MCG/ACT inhaler Inhale 2 puffs into the lungs every 6 (six) hours as needed for wheezing or shortness of breath. 18 g 3 Past Week  ? aspirin EC 81 MG tablet Take 2 tablets (162 mg total) by mouth daily. 60 tablet 6 07/06/2021  ? ferrous sulfate 325 (65 FE) MG tablet Take 1 tablet (325 mg total) by mouth every other day. 45 tablet 2 Past Week  ? budesonide (PULMICORT FLEXHALER) 180 MCG/ACT inhaler Inhale 1 puff into the lungs in the morning and at bedtime. (Patient not taking: Reported on 05/20/2021) 1 each 5   ? Spacer/Aero-Holding Chambers (AEROCHAMBER PLUS) inhaler Use with inhaler 1 each 2   ? triamcinolone ointment (KENALOG) 0.1 % Apply 1 application topically 2 (two) times daily. (Patient not taking: Reported on 07/04/2021) 454 g 1   ? ? ?Review of Systems  ?Constitutional: Negative.  Negative for fatigue and fever.  ?HENT: Negative.    ?  Respiratory: Negative.  Negative for shortness of breath.   ?Cardiovascular: Negative.  Negative for chest pain.  ?Gastrointestinal: Negative.  Negative for abdominal pain, constipation, diarrhea, nausea and vomiting.  ?Genitourinary: Negative.  Negative for dysuria, vaginal bleeding and vaginal discharge.  ?Neurological:  Positive for headaches. Negative for dizziness.  ?Physical Exam  ? ?Blood pressure (!) 142/103, pulse 94, temperature 97.8 ?F (36.6 ?C), temperature source Oral, resp. rate 17, height 5\' 5"  (1.651 m), weight 112.1 kg, last menstrual period 10/30/2020, SpO2 99 %. ? ?Patient Vitals for the past 24 hrs: ? BP Temp Temp src Pulse Resp SpO2 Height Weight  ?07/07/21 1647 (!) 147/77 -- -- (!) 102 16 -- -- --  ?07/07/21 1625 (!) 156/97 98 ?F (36.7 ?C) Oral 91 16 -- 5\' 5"  (1.651 m) 112.1 kg  ?07/07/21 1501 121/60 -- -- 85 -- -- -- --  ?07/07/21 1431 131/71 -- -- 95 -- -- -- --  ?07/07/21 1416 (!) 142/85 -- -- 92 -- -- -- --  ?07/07/21 1401 126/65 -- -- (!) 103 -- -- -- --  ?07/07/21 1346  131/64 -- -- 84 -- -- -- --  ?07/07/21 1331 130/80 -- -- 82 -- -- -- --  ?07/07/21 1316 133/86 -- -- 86 -- -- -- --  ?07/07/21 1301 127/84 -- -- 89 -- -- -- --  ?07/07/21 1246 131/89 -- -- 88 -- -- -- --  ?07/07/21 1231 131/88 -- -- 91 -- -- -- --  ?07/07/21 1216 139/86 -- -- 97 -- -- -- --  ?07/07/21 1201 (!) 140/91 -- -- 90 -- 99 % -- --  ?07/07/21 1143 (!) 142/103 -- -- 94 -- -- -- --  ?07/07/21 1124 (!) 150/97 97.8 ?F (36.6 ?C) Oral 88 17 99 % 5\' 5"  (1.651 m) 112.1 kg  ? ? ?Physical Exam ?Vitals and nursing note reviewed.  ?Constitutional:   ?   General: She is not in acute distress. ?   Appearance: She is well-developed.  ?HENT:  ?   Head: Normocephalic.  ?Eyes:  ?   Pupils: Pupils are equal, round, and reactive to light.  ?Cardiovascular:  ?   Rate and Rhythm: Normal rate and regular rhythm.  ?   Heart sounds: Normal heart sounds.  ?Pulmonary:  ?   Effort: Pulmonary effort is normal. No respiratory distress.  ?   Breath sounds: Normal breath sounds.  ?Abdominal:  ?   General: Bowel sounds are normal. There is no distension.  ?   Palpations: Abdomen is soft.  ?   Tenderness: There is no abdominal tenderness.  ?Skin: ?   General: Skin is warm and dry.  ?Neurological:  ?   Mental Status: She is alert and oriented to person, place, and time.  ?   Motor: No abnormal muscle tone.  ?   Coordination: Coordination normal.  ?   Deep Tendon Reflexes: Reflexes are normal and symmetric. Reflexes normal.  ?Psychiatric:     ?   Behavior: Behavior normal.     ?   Thought Content: Thought content normal.     ?   Judgment: Judgment normal.  ? ?Fetal Tracing: ? ?Baseline: 130 ?Variability: moderate ?Accels: 15x15 ?Decels: none ? ?Toco: none ? ?MAU Course  ?Procedures ?Results for orders placed or performed during the hospital encounter of 07/07/21 (from the past 24 hour(s))  ?CBC     Status: Abnormal  ? Collection Time: 07/07/21 11:34 AM  ?Result Value Ref Range  ? WBC 8.7 4.0 - 10.5 K/uL  ?  RBC 3.77 (L) 3.87 - 5.11 MIL/uL  ?  Hemoglobin 10.6 (L) 12.0 - 15.0 g/dL  ? HCT 31.4 (L) 36.0 - 46.0 %  ? MCV 83.3 80.0 - 100.0 fL  ? MCH 28.1 26.0 - 34.0 pg  ? MCHC 33.8 30.0 - 36.0 g/dL  ? RDW 12.6 11.5 - 15.5 %  ? Platelets 204 150 - 400 K/uL  ? nRBC 0.0 0.0 - 0.2 %  ?Comprehensive metabolic panel     Status: Abnormal  ? Collection Time: 07/07/21 11:34 AM  ?Result Value Ref Range  ? Sodium 136 135 - 145 mmol/L  ? Potassium 3.8 3.5 - 5.1 mmol/L  ? Chloride 110 98 - 111 mmol/L  ? CO2 20 (L) 22 - 32 mmol/L  ? Glucose, Bld 75 70 - 99 mg/dL  ? BUN 8 6 - 20 mg/dL  ? Creatinine, Ser 0.65 0.44 - 1.00 mg/dL  ? Calcium 8.2 (L) 8.9 - 10.3 mg/dL  ? Total Protein 6.2 (L) 6.5 - 8.1 g/dL  ? Albumin 2.7 (L) 3.5 - 5.0 g/dL  ? AST 32 15 - 41 U/L  ? ALT 49 (H) 0 - 44 U/L  ? Alkaline Phosphatase 78 38 - 126 U/L  ? Total Bilirubin 0.3 0.3 - 1.2 mg/dL  ? GFR, Estimated >60 >60 mL/min  ? Anion gap 6 5 - 15  ?Type and screen [facility]     Status: None  ? Collection Time: 07/07/21 11:34 AM  ?Result Value Ref Range  ? ABO/RH(D) O POS   ? Antibody Screen NEG   ? Sample Expiration    ?  07/10/2021,2359 ?Performed at [facility], [place] [address] [place], [place] [address] ?  ?Protein / creatinine ratio, urine     Status: Abnormal  ? Collection Time: 07/07/21 11:40 AM  ?Result Value Ref Range  ? Creatinine, Urine 104.55 mg/dL  ? Total Protein, Urine 25 mg/dL  ? Protein Creatinine Ratio 0.24 (H) 0.00 - 0.15 mg/mg[Cre]  ?Urinalysis, Routine w reflex microscopic     Status: Abnormal  ? Collection Time: 07/07/21 11:40 AM  ?Result Value Ref Range  ? Color, Urine YELLOW YELLOW  ? APPearance HAZY (A) CLEAR  ? Specific Gravity, Urine 1.018 1.005 - 1.030  ? pH 5.0 5.0 - 8.0  ? Glucose, UA NEGATIVE NEGATIVE mg/dL  ? Hgb urine dipstick NEGATIVE NEGATIVE  ? Bilirubin Urine NEGATIVE NEGATIVE  ? Ketones, ur NEGATIVE NEGATIVE mg/dL  ? Protein, ur NEGATIVE NEGATIVE mg/dL  ? Nitrite NEGATIVE NEGATIVE  ? Leukocytes,Ua NEGATIVE NEGATIVE  ?  ?MDM ?UA ?CBC, CMP,  Protein/creat ratio ?Tylenol and Reglan PO ? ?Pt informed that the ultrasound is considered a limited OB ultrasound and is not intended to be a complete ultrasound exam.  Patient also informed that the ultrasound is not

## 2021-07-08 ENCOUNTER — Other Ambulatory Visit: Payer: Medicaid Other

## 2021-07-08 ENCOUNTER — Encounter: Payer: Medicaid Other | Admitting: Women's Health

## 2021-07-08 ENCOUNTER — Encounter: Payer: Medicaid Other | Admitting: Obstetrics & Gynecology

## 2021-07-08 DIAGNOSIS — O1414 Severe pre-eclampsia complicating childbirth: Secondary | ICD-10-CM | POA: Diagnosis not present

## 2021-07-08 DIAGNOSIS — O99820 Streptococcus B carrier state complicating pregnancy: Secondary | ICD-10-CM | POA: Diagnosis not present

## 2021-07-08 DIAGNOSIS — Z3A35 35 weeks gestation of pregnancy: Secondary | ICD-10-CM | POA: Diagnosis not present

## 2021-07-08 LAB — MAGNESIUM: Magnesium: 4.8 mg/dL — ABNORMAL HIGH (ref 1.7–2.4)

## 2021-07-08 LAB — CBC
HCT: 29.8 % — ABNORMAL LOW (ref 36.0–46.0)
Hemoglobin: 9.8 g/dL — ABNORMAL LOW (ref 12.0–15.0)
MCH: 27.5 pg (ref 26.0–34.0)
MCHC: 32.9 g/dL (ref 30.0–36.0)
MCV: 83.7 fL (ref 80.0–100.0)
Platelets: 202 10*3/uL (ref 150–400)
RBC: 3.56 MIL/uL — ABNORMAL LOW (ref 3.87–5.11)
RDW: 12.5 % (ref 11.5–15.5)
WBC: 9.4 10*3/uL (ref 4.0–10.5)
nRBC: 0 % (ref 0.0–0.2)

## 2021-07-08 LAB — COMPREHENSIVE METABOLIC PANEL
ALT: 46 U/L — ABNORMAL HIGH (ref 0–44)
AST: 32 U/L (ref 15–41)
Albumin: 2.6 g/dL — ABNORMAL LOW (ref 3.5–5.0)
Alkaline Phosphatase: 69 U/L (ref 38–126)
Anion gap: 7 (ref 5–15)
BUN: 5 mg/dL — ABNORMAL LOW (ref 6–20)
CO2: 22 mmol/L (ref 22–32)
Calcium: 7 mg/dL — ABNORMAL LOW (ref 8.9–10.3)
Chloride: 106 mmol/L (ref 98–111)
Creatinine, Ser: 0.65 mg/dL (ref 0.44–1.00)
GFR, Estimated: >60 mL/min (ref >60)
Glucose, Bld: 92 mg/dL (ref 70–99)
Potassium: 3.6 mmol/L (ref 3.5–5.1)
Sodium: 135 mmol/L (ref 135–145)
Total Bilirubin: 0.6 mg/dL (ref 0.3–1.2)
Total Protein: 5.9 g/dL — ABNORMAL LOW (ref 6.5–8.1)

## 2021-07-08 LAB — RPR: RPR Ser Ql: NONREACTIVE

## 2021-07-08 MED ORDER — TERBUTALINE SULFATE 1 MG/ML IJ SOLN: 0.2500 mg | Freq: Once | INTRAMUSCULAR | Status: DC | PRN

## 2021-07-08 MED ORDER — OXYTOCIN-SODIUM CHLORIDE 30-0.9 UT/500ML-% IV SOLN
1.0000 m[IU]/min | INTRAVENOUS | Status: DC
  Administered 2021-07-08 – 2021-07-09 (×2): 2 m[IU]/min via INTRAVENOUS

## 2021-07-08 MED ORDER — MISOPROSTOL 25 MCG QUARTER TABLET
25.0000 ug | ORAL_TABLET | ORAL | Status: DC | PRN
  Administered 2021-07-08: 25 ug via VAGINAL
  Filled 2021-07-08: qty 1

## 2021-07-08 NOTE — Progress Notes (Signed)
Roberta Guerrero is a 26 y.o. G1P0000 at [redacted]w[redacted]d admitted for induction of labor due to [place] with SIPE with SF (headaches). ? ?Subjective: ?Reports feels ok. Got some rest. Ok with check and balloon placement at this time. ? ?Objective: ?BP (!) 142/92   Pulse 82   Temp 97.6 ?F (36.4 ?C) (Oral)   Resp 18   Ht 5\' 5"  (1.651 m)   Wt 112.1 kg   LMP 10/30/2020 (Exact Date)   SpO2 99%   BMI 41.13 kg/m?  ?I/O last 3 completed shifts: ?In: 933.3 [P.O.:480; I.V.:203.3; IV Piggyback:250] ?Out: 400 [Urine:400] ?Total I/O ?In: 2774.2 [P.O.:1820; I.V.:803.8; IV Piggyback:150.3] ?Out: [id number] ? ?FHT:  FHR: 130 bpm, variability: moderate,  accelerations:  Present,  decelerations:  Absent ?UC:   irregular, every 2-5 minutes ?SVE:   Dilation: 1 ?Effacement (%): 50 ?Station: -3 ?Exam by:: Cy Blamer, MD ? ?Labs: ?Lab Results  ?Component Value Date  ? WBC 9.4 07/08/2021  ? HGB 9.8 (L) 07/08/2021  ? HCT 29.8 (L) 07/08/2021  ? MCV 83.7 07/08/2021  ? PLT 202 07/08/2021  ? ? ?Assessment / Plan: ?G1P0000 at [redacted]w[redacted]d admitted for induction of labor due to [place] with SIPE with SF (headaches). ? ?Labor:  s/p cytotec x3. Now cervix 1 cm and thick and ballotable. Cooks balloon placed with speculum guidance. Upon placement checked manually to ensure placement in appropriate place. Patient and fetus tolerated procedure well. Will also do another dose of Cytotec.  ?Preeclampsia:  on magnesium sulfate, 5 AM labs - normal platelets and AST. ALT mildly elevated at 46 which is at baseline from admission and lower than during prenatal care. No headache and BP normotensive to mild range.  ?-continue to monitor ?Fetal Wellbeing:  Category I ? ?I/D:   GBS pos>PCN ? ?Renard Matter, MD, MPH ?OB Fellow, [facility] ? ?

## 2021-07-08 NOTE — Progress Notes (Signed)
Roberta Guerrero is a 26 y.o. G1P0000 at [redacted]w[redacted]d admitted for induction of labor due to preE with SF (HA). ? ? ? ?Subjective: ?Feels ok. Feeling some cramping. Denies headache ? ? ?Objective: ?BP 102/85   Pulse 85   Temp 98.2 ?F (36.8 ?C) (Oral)   Resp 16   Ht 5\' 5"  (1.651 m)   Wt 112.1 kg   LMP 10/30/2020 (Exact Date)   SpO2 99%   BMI 41.13 kg/m?  ?I/O last 3 completed shifts: ?In: 933.3 [P.O.:480; I.V.:203.3; IV Piggyback:250] ?Out: 400 [Urine:400] ?Total I/O ?In: 1946.3 [P.O.:1460; I.V.:436.3; IV Piggyback:50] ?Out: 2430 [Urine:2430] ? ?FHT:  FHR: 130 bpm, variability: moderate,  accelerations:  Present,  decelerations:  Absent ?UC:   regular, every 2-5 minutes ?SVE:   Dilation: Fingertip ?Effacement (%): 50 ?Station: Ballotable ?Exam by:: [email or web address], MD ? ?Labs: ?Lab Results  ?Component Value Date  ? WBC 8.7 07/07/2021  ? HGB 10.6 (L) 07/07/2021  ? HCT 31.4 (L) 07/07/2021  ? MCV 83.3 07/07/2021  ? PLT 204 07/07/2021  ? ? ?Assessment / Plan: ?G1P0000 at [redacted]w[redacted]d admitted for induction of labor due to preE with SF (HA). ? ?Labor: now s/p cytotec x2. Continues to be fingertip external with a tight mostly closed internal os. Offered speculum guided balloon placement now and additional cytotec or additional cytotec now with FB placement attempt at next check. Patient opts to do additional cytotec now and FB at next check. ?Preeclampsia:  on magnesium sulfate, BP mostly mild range. No severe range BP here. HA now resolved. Will plan for repeat labs in AM ?Fetal Wellbeing:  Category I ?Pain Control:  Labor support without medications ?I/D:   GBS pos>PCN ? ?[redacted]w[redacted]d ?07/08/2021, 2:33 AM ? ? ?

## 2021-07-08 NOTE — Progress Notes (Signed)
Labor Progress Note ?Roberta Guerrero is a 26 y.o. G1P0000 at [redacted]w[redacted]d presented for IOL due cHTN w/ SIPE with SF  ? ?S: Just got a nap. Doing well. Not having too much cramping.  ? ?O:  ?BP 138/84 (BP Location: Right Arm)   Pulse 92   Temp 98.1 ?F (36.7 ?C) (Oral)   Resp 16   Ht 5\' 5"  (1.651 m)   Wt 112.1 kg   LMP 10/30/2020 (Exact Date)   SpO2 99%   BMI 41.13 kg/m?  ?EFM: 125/mod/10x10/none ? ?CVE: Dilation: 2 ?Effacement (%): 50 ?Cervical Position: Posterior ?Station: -3 ?Presentation: Vertex ?Exam by:: Dr. Higinio Plan ? ? ?A&P: 26 y.o. G1P0000 [redacted]w[redacted]d  ?#Labor: Cooks FB has been in place for about 8 hours now. Checked around, cervix is about 2 cm and still relatively thick when cooks in correct location. Placed tension on tubing and taped to her thigh again. Will give additional cytotec, place vaginally this time (has been buccal).  ?#Pain: PRN  ?#FWB: Cat I ?#GBS positive ? ?#CHTN with SIPE/SF: BP has been mild range only. HA is resolved and otherwise asymptomatic. Pre-e labs this am still WNL. Cont to monitor.  ? ?Patriciaann Clan, DO ?2:52 PM  ?

## 2021-07-08 NOTE — Progress Notes (Signed)
Roberta Guerrero is a 26 y.o. G1P0000 at [redacted]w[redacted]d admitted for induction of labor due to cHTN with SIPE with SF (headache). ? ?Subjective: ?Doing well. Sitting on birthing ball.  ? ?Objective: ?BP 127/72   Pulse 88   Temp 98.1 ?F (36.7 ?C) (Oral)   Resp 18   Ht 5\' 5"  (1.651 m)   Wt 112.1 kg   LMP 10/30/2020 (Exact Date)   SpO2 99%   BMI 41.13 kg/m?  ?I/O last 3 completed shifts: ?In: 5817.9 [P.O.:2960; I.V.:2307.9; IV Piggyback:550] ?Out: 6980 [Urine:6680; Emesis/NG output:300] ?Total I/O ?In: 242 [I.V.:242] ?Out: 600 [Urine:600] ? ?FHT:  FHR: 140 bpm, variability: moderate,  accelerations:  Abscent,  decelerations:  Absent ?UC:   difficult to trace but likely every 2-3 minutes ?SVE:   Dilation: 3.5 ?Effacement (%): 50 ?Station: -3 ?Exam by:: [email or web address], MD ? ?Labs: ?Lab Results  ?Component Value Date  ? WBC 9.4 07/08/2021  ? HGB 9.8 (L) 07/08/2021  ? HCT 29.8 (L) 07/08/2021  ? MCV 83.7 07/08/2021  ? PLT 202 07/08/2021  ? ? ?Assessment / Plan: ?G1P0000 at [redacted]w[redacted]d admitted for induction of labor due to cHTN with SIPE with SF (headache). ? ?Labor:  Cooks balloon now out. Cervix 3-4 cm. Head well applied. AROM performed during current check with clear fluid (moderate amount). Tolerated well by fetus and patient. Will plan to start pitocin 2x2 if contractions not frequent. Plan to reassess in 4 hours and can place IUPC if needed  ?Preeclampsia:  on magnesium sulfate, labs this AM normal. Currently no headache. BP normotensive to mild range. Plan for recheck of labs at [facility] or sooner if clinically warranted.  ?Fetal Wellbeing:  Category I ?Pain Control:  Labor support without medications ?I/D:   GBS pos>PCN ? ? ?[facility] ?07/08/2021, 9:15 PM ? ? ?

## 2021-07-09 ENCOUNTER — Inpatient Hospital Stay (HOSPITAL_COMMUNITY): Payer: Medicaid Other | Admitting: Anesthesiology

## 2021-07-09 LAB — COMPREHENSIVE METABOLIC PANEL
ALT: 48 U/L — ABNORMAL HIGH (ref 0–44)
ALT: 50 U/L — ABNORMAL HIGH (ref 0–44)
AST: 32 U/L (ref 15–41)
AST: 33 U/L (ref 15–41)
Albumin: 2.8 g/dL — ABNORMAL LOW (ref 3.5–5.0)
Albumin: 2.9 g/dL — ABNORMAL LOW (ref 3.5–5.0)
Alkaline Phosphatase: 80 U/L (ref 38–126)
Alkaline Phosphatase: 93 U/L (ref 38–126)
Anion gap: 8 (ref 5–15)
Anion gap: 8 (ref 5–15)
BUN: <5 mg/dL — ABNORMAL LOW (ref 6–20)
BUN: 5 mg/dL — ABNORMAL LOW (ref 6–20)
CO2: 23 mmol/L (ref 22–32)
CO2: 22 mmol/L (ref 22–32)
Calcium: 6.9 mg/dL — ABNORMAL LOW (ref 8.9–10.3)
Calcium: 6.6 mg/dL — ABNORMAL LOW (ref 8.9–10.3)
Chloride: 103 mmol/L (ref 98–111)
Chloride: 103 mmol/L (ref 98–111)
Creatinine, Ser: 0.71 mg/dL (ref 0.44–1.00)
Creatinine, Ser: 0.63 mg/dL (ref 0.44–1.00)
GFR, Estimated: >60 mL/min (ref >60)
GFR, Estimated: >60 mL/min (ref >60)
Glucose, Bld: 104 mg/dL — ABNORMAL HIGH (ref 70–99)
Glucose, Bld: 99 mg/dL (ref 70–99)
Potassium: 3.8 mmol/L (ref 3.5–5.1)
Potassium: 4.3 mmol/L (ref 3.5–5.1)
Sodium: 134 mmol/L — ABNORMAL LOW (ref 135–145)
Sodium: 133 mmol/L — ABNORMAL LOW (ref 135–145)
Total Bilirubin: 0.4 mg/dL (ref 0.3–1.2)
Total Bilirubin: 0.7 mg/dL (ref 0.3–1.2)
Total Protein: 6.6 g/dL (ref 6.5–8.1)
Total Protein: 6.9 g/dL (ref 6.5–8.1)

## 2021-07-09 LAB — CBC WITH DIFFERENTIAL/PLATELET
Abs Immature Granulocytes: 0.08 10*3/uL — ABNORMAL HIGH (ref 0.00–0.07)
Basophils Absolute: 0 10*3/uL (ref 0.0–0.1)
Basophils Relative: 0 %
Eosinophils Absolute: 0.1 10*3/uL (ref 0.0–0.5)
Eosinophils Relative: 1 %
HCT: 33 % — ABNORMAL LOW (ref 36.0–46.0)
Hemoglobin: 11.1 g/dL — ABNORMAL LOW (ref 12.0–15.0)
Immature Granulocytes: 1 %
Lymphocytes Relative: 12 %
Lymphs Abs: 1.3 10*3/uL (ref 0.7–4.0)
MCH: 27.5 pg (ref 26.0–34.0)
MCHC: 33.6 g/dL (ref 30.0–36.0)
MCV: 81.7 fL (ref 80.0–100.0)
Monocytes Absolute: 1 10*3/uL (ref 0.1–1.0)
Monocytes Relative: 9 %
Neutro Abs: 8.2 10*3/uL — ABNORMAL HIGH (ref 1.7–7.7)
Neutrophils Relative %: 77 %
Platelets: 223 10*3/uL (ref 150–400)
RBC: 4.04 MIL/uL (ref 3.87–5.11)
RDW: 12.5 % (ref 11.5–15.5)
WBC: 10.7 10*3/uL — ABNORMAL HIGH (ref 4.0–10.5)
nRBC: 0 % (ref 0.0–0.2)

## 2021-07-09 LAB — CBC
HCT: 34.1 % — ABNORMAL LOW (ref 36.0–46.0)
Hemoglobin: 11.1 g/dL — ABNORMAL LOW (ref 12.0–15.0)
MCH: 27 pg (ref 26.0–34.0)
MCHC: 32.6 g/dL (ref 30.0–36.0)
MCV: 83 fL (ref 80.0–100.0)
Platelets: 244 10*3/uL (ref 150–400)
RBC: 4.11 MIL/uL (ref 3.87–5.11)
RDW: 12.7 % (ref 11.5–15.5)
WBC: 13.4 10*3/uL — ABNORMAL HIGH (ref 4.0–10.5)
nRBC: 0 % (ref 0.0–0.2)

## 2021-07-09 LAB — MAGNESIUM: Magnesium: 5.8 mg/dL — ABNORMAL HIGH (ref 1.7–2.4)

## 2021-07-09 MED ORDER — DIPHENHYDRAMINE HCL 50 MG/ML IJ SOLN: 12.5000 mg | INTRAMUSCULAR | Status: DC | PRN

## 2021-07-09 MED ORDER — PHENYLEPHRINE 40 MCG/ML (10ML) SYRINGE FOR IV PUSH (FOR BLOOD PRESSURE SUPPORT): 80.0000 ug | PREFILLED_SYRINGE | INTRAVENOUS | Status: DC | PRN

## 2021-07-09 MED ORDER — EPHEDRINE 5 MG/ML INJ: 10.0000 mg | INTRAVENOUS | Status: DC | PRN

## 2021-07-09 MED ORDER — LIDOCAINE HCL (PF) 1 % IJ SOLN
INTRAMUSCULAR | Status: DC | PRN
  Administered 2021-07-09: 8 mL via EPIDURAL

## 2021-07-09 MED ORDER — LACTATED RINGERS IV SOLN: 500.0000 mL | Freq: Once | INTRAVENOUS | Status: DC

## 2021-07-09 MED ORDER — FENTANYL-BUPIVACAINE-NACL 0.5-0.125-0.9 MG/250ML-% EP SOLN: 12.0000 mL/h | EPIDURAL | Status: DC | PRN

## 2021-07-09 MED ORDER — FENTANYL-BUPIVACAINE-NACL 0.5-0.125-0.9 MG/250ML-% EP SOLN
12.0000 mL/h | EPIDURAL | Status: DC | PRN
  Administered 2021-07-09: 12 mL/h via EPIDURAL
  Filled 2021-07-09: qty 250

## 2021-07-09 MED ORDER — MISOPROSTOL 50MCG HALF TABLET
50.0000 ug | ORAL_TABLET | ORAL | Status: DC | PRN
  Administered 2021-07-09: 50 ug via BUCCAL
  Filled 2021-07-09: qty 1

## 2021-07-09 NOTE — Progress Notes (Signed)
Labor Progress Note ?Roberta Guerrero is a 26 y.o. G1P0000 at [redacted]w[redacted]d who presented for IOL due to cHTN with SIPE with SF (headache).  ? ?S: Doing well. Feeling her contractions. No other concerns at this time. Family at bedside.  ? ?O:  ?BP (!) 141/89   Pulse (!) 103   Temp 98 ?F (36.7 ?C) (Oral)   Resp 18   Ht 5\' 5"  (1.651 m)   Wt 112.1 kg   LMP 10/30/2020 (Exact Date)   SpO2 99%   BMI 41.13 kg/m?  ? ?EFM: Baseline 135 bpm, moderate variability, + accels, no decels ?Toco: Every 2-5 minutes, MVUs inadequate  ? ?CVE: Dilation: 4 ?Effacement (%): 50 ?Cervical Position: Posterior ?Station: -2 ?Presentation: Vertex ?Exam by:: L. [email or web address], RN ? ?A&P: 26 y.o. G1P0000 [redacted]w[redacted]d   ? ?#Labor: Pitocin restarted at 1211. Working on achieving adequate contraction pattern. Will continue to up titrate Pitocin until achieving adequate MVUs per IUPC. Will allow for several hours of adequate contractions before repeating cervical exam to reduce infection risk. Dr. [redacted]w[redacted]d at bedside to discuss plan with patient who voiced understanding. All questions and concerns addressed.  ?#Pain: PRN ?#FWB: Cat 1  ?#GBS positive; receiving PCN ? ?#cHTN with SIPE w/ SF: On Mag. AM CBC and CMP stable. Will repeat CBC, CMP, and Mg level at 1800. No symptoms currently. BP normal to mild range. Will continue to monitor.  ? ?Charlotta Newton, MD ?5:53 PM ? ?

## 2021-07-09 NOTE — Progress Notes (Signed)
Roberta Guerrero is a 26 y.o. G1P0000 at [redacted]w[redacted]d admitted for induction of labor due to [facility] with SIPE w/SF (HA). ? ?Subjective: ?Feeling contractions more painfully but coping with them. Ok with cervical check at this time. ? ?Objective: ?BP 133/84   Pulse 87   Temp 98.6 ?F (37 ?C) (Axillary)   Resp 18   Ht 5\' 5"  (1.651 m)   Wt 112.1 kg   LMP 10/30/2020 (Exact Date)   SpO2 99%   BMI 41.13 kg/m?  ?I/O last 3 completed shifts: ?In: 7554.6 [P.O.:3620; I.V.:3434.6; IV Piggyback:500] ?Out: 7780 [Urine:7480; Emesis/NG output:300] ?Total I/O ?In: 139.5 [I.V.:139.5] ?Out: 200 [Urine:200] ? ?FHT:  FHR: 130 bpm, variability: moderate,  accelerations:  Present,  decelerations:  Absent ?UC:   coupling pattern, at times every 1 min and at other times spaced at ?SVE:   Dilation: 4.5 ?Effacement (%): 50 ?Station: -2 ?Exam by:: [email or web address], MD ? ?Labs: ?Lab Results  ?Component Value Date  ? WBC 10.7 (H) 07/09/2021  ? HGB 11.1 (L) 07/09/2021  ? HCT 33.0 (L) 07/09/2021  ? MCV 81.7 07/09/2021  ? PLT 223 07/09/2021  ? ? ?Assessment / Plan: ?G1P0000 at [redacted]w[redacted]d admitted for induction of labor due to cHTN with SIPE w/SF (HA). ? ?Labor:  Cervix remains ~4 cm and 50%effaced after AROM and pitocin with contractions adequate but somewhat irregular pattern. Head with some descent in pelvis. Discussed with patient that cervix could potentially benefit for further ripening and therefore could do another cytotec to see if  that helps. Patient agreeable. Also discussed in detail that frequent position changes can help facilitate descent and more optimal positioning of fetus.   ? ?Discussed that pitocin would likely be restarted in 4 hours from cytotec dose. Also discussed team would assess if cervix continues to be unchanged despite high doses of pitocin and AROM for long period of time - and at that time she could be recommended for a cesarean delivery.  ? ? ?Preeclampsia:  on magnesium sulfate, HA improves with tylenol. Labs stable. BP normotensive  during last few checks. Will continue to monitor. ?Fetal Wellbeing:  Category I ? ?I/D:   GBS pos>PCN ? ?[redacted]w[redacted]d ?07/09/2021, 9:17 AM ? ? ?

## 2021-07-09 NOTE — Progress Notes (Signed)
Patient ID: Roberta Guerrero, female   DOB: [birth year], 26 y.o.   MRN: [id number] ?Hurting more with contractions ? ?Vitals:  ? 07/09/21 1609 07/09/21 1743 07/09/21 1822 07/09/21 1914  ?BP: (!) 141/89 (!) 123/103 133/80 (!) 138/102  ?Pulse: (!) 103 88 88 88  ?Resp:    19  ?Temp:    98.2 ?F (36.8 ?C)  ?TempSrc:    Oral  ?SpO2:      ?Weight:      ?Height:      ? ?FHR stable, reassuring ? ?Cervexam deferred ? ? ?

## 2021-07-09 NOTE — Anesthesia Preprocedure Evaluation (Signed)
Anesthesia Evaluation  ?Patient identified by MRN, date of birth, ID band ?Patient awake ? ? ? ?Reviewed: ?Allergy & Precautions, H&P , NPO status , Patient's Chart, lab work & pertinent test results, reviewed documented beta blocker date and time  ? ?Airway ?Mallampati: II ? ?TM Distance: >3 FB ?Neck ROM: full ? ? ? Dental ?no notable dental hx. ?(+) Teeth Intact, Dental Advisory Given ?  ?Pulmonary ?asthma , former smoker,  ?  ?Pulmonary exam normal ?breath sounds clear to auscultation ? ? ? ? ? ? Cardiovascular ?hypertension, Pt. on medications ?negative cardio ROS ?Normal cardiovascular exam ?Rhythm:regular Rate:Normal ? ? ?  ?Neuro/Psych ? Headaches, negative psych ROS  ? GI/Hepatic ?negative GI ROS, Neg liver ROS,   ?Endo/Other  ?negative endocrine ROS ? Renal/GU ?negative Renal ROS  ?negative genitourinary ?  ?Musculoskeletal ? ? Abdominal ?  ?Peds ? Hematology ?negative hematology ROS ?(+)   ?Anesthesia Other Findings ? ? Reproductive/Obstetrics ?(+) Pregnancy ? ?  ? ? ? ? ? ? ? ? ? ? ? ? ? ?  ?  ? ? ? ? ? ? ? ? ?Anesthesia Physical ?Anesthesia Plan ? ?ASA: 3 ? ?Anesthesia Plan: Epidural  ? ?Post-op Pain Management: Minimal or no pain anticipated  ? ?Induction:  ? ?PONV Risk Score and Plan: 2 and Treatment may vary due to age or medical condition ? ?Airway Management Planned: Natural Airway ? ?Additional Equipment:  ? ?Intra-op Plan:  ? ?Post-operative Plan: Extubation in OR ? ?Informed Consent: I have reviewed the patients History and Physical, chart, labs and discussed the procedure including the risks, benefits and alternatives for the proposed anesthesia with the patient or authorized representative who has indicated his/her understanding and acceptance.  ? ? ? ? ? ?Plan Discussed with: Anesthesiologist ? ?Anesthesia Plan Comments:   ? ? ? ? ? ? ?Anesthesia Quick Evaluation ? ?

## 2021-07-09 NOTE — Anesthesia Procedure Notes (Signed)
Epidural ?Patient location during procedure: OB ?Start time: 07/09/2021 10:39 PM ?End time: 07/09/2021 10:42 PM ? ?Staffing ?Anesthesiologist: Janeece Riggers, MD ? ?Preanesthetic Checklist ?Completed: patient identified, IV checked, site marked, risks and benefits discussed, surgical consent, monitors and equipment checked, pre-op evaluation and timeout performed ? ?Epidural ?Patient position: sitting ?Prep: DuraPrep and site prepped and draped ?Patient monitoring: continuous pulse ox and blood pressure ?Approach: midline ?Location: L4-L5 ?Injection technique: LOR air ? ?Needle:  ?Needle type: Tuohy  ?Needle gauge: 17 G ?Needle length: 9 cm and 9 ?Needle insertion depth: 8 cm ?Catheter type: closed end flexible ?Catheter size: 19 Gauge ?Catheter at skin depth: 13 cm ?Test dose: negative ? ?Assessment ?Events: blood not aspirated, injection not painful, no injection resistance, no paresthesia and negative IV test ? ? ? ?

## 2021-07-09 NOTE — Progress Notes (Signed)
Roberta Guerrero is a 26 y.o. G1P0000 at [redacted]w[redacted]d admitted for induction of labor due to cHTN with SIPE with SF (headache). ?  ? ?Subjective: ?Reports has a headache now. Initially did not want any medications but upon further discussion would like some Tylenol. Feeling contractions. They come and go. Rates them a 7/10. Ok with check now and IUPC if warranted ? ?Objective: ?BP (!) 129/94   Pulse 83   Temp 98.2 ?F (36.8 ?C) (Axillary)   Resp 18   Ht 5\' 5"  (1.651 m)   Wt 112.1 kg   LMP 10/30/2020 (Exact Date)   SpO2 99%   BMI 41.13 kg/m?  ?I/O last 3 completed shifts: ?In: 5817.9 [P.O.:2960; I.V.:2307.9; IV Piggyback:550] ?Out: 6980 [Urine:6680; Emesis/NG output:300] ?Total I/O ?In: 406.5 [I.V.:356.5; IV Piggyback:50] ?Out: 600 [Urine:600] ? ?FHT:  FHR: 130 bpm, variability: moderate,  accelerations:  Present,  decelerations:  Absent ?UC:  difficult to trace on external toco. Every 2-4 mins. Possible coupling ?SVE:   Dilation: 4.5 ?Effacement (%): 50 ?Station: -3 ?Exam by:: [email or web address], MD ? ?Labs: ?Lab Results  ?Component Value Date  ? WBC 9.4 07/08/2021  ? HGB 9.8 (L) 07/08/2021  ? HCT 29.8 (L) 07/08/2021  ? MCV 83.7 07/08/2021  ? PLT 202 07/08/2021  ? ? ?Assessment / Plan: ?26 y.o. G1P0000 at [redacted]w[redacted]d admitted for induction of labor due to cHTN with SIPE with SF (headache). ?  ? ?Labor:  Cervix still ~ 4 cm. Likely minimally more dilated than before (from 3.5 to 4.5). Still with some thickness and fetal head high in pelvis. Discussed movement and sitting on ball. Or peanut ball. IUPC placed and will uptitrate pitocin as appropriate. Will plan to reassess in 4 hours.  ?Preeclampsia:  Currently with HA. Will get Tylenol. Last set of labs normal ~0500. Will recheck labs at 0500 unless headache doesn't resolve (then would check sooner) ?Fetal Wellbeing:  Category I ?Pain Control:  Labor support without medications ?I/D:   GBS pos>PCN ? ? ?[redacted]w[redacted]d ?07/09/2021, 1:14 AM ? ? ?

## 2021-07-09 NOTE — Progress Notes (Signed)
Roberta Guerrero is a 26 y.o. G1P0000 at [redacted]w[redacted]d admitted for induction of labor due to [facility] with SIPE w/SF (HA). ? ?Subjective: ?Bouncing on ball ? ?Objective: ?BP 133/84   Pulse 87   Temp 98.6 ?F (37 ?C) (Axillary)   Resp 18   Ht 5\' 5"  (1.651 m)   Wt 112.1 kg   LMP 10/30/2020 (Exact Date)   SpO2 99%   BMI 41.13 kg/m?  ?I/O last 3 completed shifts: ?In: 7554.6 [P.O.:3620; I.V.:3434.6; IV Piggyback:500] ?Out: 7780 [Urine:7480; Emesis/NG output:300] ?Total I/O ?In: 139.5 [I.V.:139.5] ?Out: 650 [Urine:650] ? ?FHT:  FHR: 130 bpm, variability: moderate,  accelerations:  Present,  decelerations:  Absent ?UC:   ~8 minutes ?SVE:   Dilation: 4.5 ?Effacement (%): 50 ?Station: -2 ?Exam by:: Roberta Blamer, MD ? ?Labs: ?Lab Results  ?Component Value Date  ? WBC 10.7 (H) 07/09/2021  ? HGB 11.1 (L) 07/09/2021  ? HCT 33.0 (L) 07/09/2021  ? MCV 81.7 07/09/2021  ? PLT 223 07/09/2021  ? ? ?Assessment / Plan: ?G1P0000 at [redacted]w[redacted]d admitted for induction of labor due to [place] with SIPE w/SF (HA). ? ?Labor:  ?Will plan for recheck and restarting pitocin after patient eats and encourage movement. ? ?Preeclampsia:  on magnesium sulfate, Labs stable. BP normotensive most recently. Will continue to monitor. ?Fetal Wellbeing:  Category I ? ?I/D:   GBS pos>PCN ? ?Roberta Guerrero ?07/09/2021, 10:10 AM ? ? ?

## 2021-07-10 ENCOUNTER — Encounter (HOSPITAL_COMMUNITY): Payer: Self-pay | Admitting: Family Medicine

## 2021-07-10 DIAGNOSIS — Z3A35 35 weeks gestation of pregnancy: Secondary | ICD-10-CM

## 2021-07-10 DIAGNOSIS — O99820 Streptococcus B carrier state complicating pregnancy: Secondary | ICD-10-CM

## 2021-07-10 DIAGNOSIS — O1414 Severe pre-eclampsia complicating childbirth: Secondary | ICD-10-CM

## 2021-07-10 LAB — COMPREHENSIVE METABOLIC PANEL
ALT: 46 U/L — ABNORMAL HIGH (ref 0–44)
AST: 32 U/L (ref 15–41)
Albumin: 2.7 g/dL — ABNORMAL LOW (ref 3.5–5.0)
Alkaline Phosphatase: 91 U/L (ref 38–126)
Anion gap: 8 (ref 5–15)
BUN: <5 mg/dL — ABNORMAL LOW (ref 6–20)
CO2: 21 mmol/L — ABNORMAL LOW (ref 22–32)
Calcium: 6.7 mg/dL — ABNORMAL LOW (ref 8.9–10.3)
Chloride: 101 mmol/L (ref 98–111)
Creatinine, Ser: 0.65 mg/dL (ref 0.44–1.00)
GFR, Estimated: >60 mL/min (ref >60)
Glucose, Bld: 120 mg/dL — ABNORMAL HIGH (ref 70–99)
Potassium: 3.9 mmol/L (ref 3.5–5.1)
Sodium: 130 mmol/L — ABNORMAL LOW (ref 135–145)
Total Bilirubin: 0.5 mg/dL (ref 0.3–1.2)
Total Protein: 6.5 g/dL (ref 6.5–8.1)

## 2021-07-10 LAB — CBC
HCT: 33.2 % — ABNORMAL LOW (ref 36.0–46.0)
Hemoglobin: 11.4 g/dL — ABNORMAL LOW (ref 12.0–15.0)
MCH: 28.6 pg (ref 26.0–34.0)
MCHC: 34.3 g/dL (ref 30.0–36.0)
MCV: 83.2 fL (ref 80.0–100.0)
Platelets: 231 10*3/uL (ref 150–400)
RBC: 3.99 MIL/uL (ref 3.87–5.11)
RDW: 12.5 % (ref 11.5–15.5)
WBC: 14.7 10*3/uL — ABNORMAL HIGH (ref 4.0–10.5)
nRBC: 0 % (ref 0.0–0.2)

## 2021-07-10 LAB — MAGNESIUM: Magnesium: 5.5 mg/dL — ABNORMAL HIGH (ref 1.7–2.4)

## 2021-07-10 MED ORDER — METHYLERGONOVINE MALEATE 0.2 MG/ML IJ SOLN: 0.2000 mg | INTRAMUSCULAR | Status: DC | PRN

## 2021-07-10 MED ORDER — TETANUS-DIPHTH-ACELL PERTUSSIS 5-2.5-18.5 LF-MCG/0.5 IM SUSY: 0.5000 mL | PREFILLED_SYRINGE | Freq: Once | INTRAMUSCULAR | Status: DC

## 2021-07-10 MED ORDER — ACETAMINOPHEN 325 MG PO TABS: 650.0000 mg | ORAL_TABLET | ORAL | Status: DC | PRN

## 2021-07-10 MED ORDER — COCONUT OIL OIL: 1.0000 "application " | TOPICAL_OIL | Status: DC | PRN

## 2021-07-10 MED ORDER — SENNOSIDES-DOCUSATE SODIUM 8.6-50 MG PO TABS
2.0000 | ORAL_TABLET | Freq: Every day | ORAL | Status: DC
  Administered 2021-07-11: 2 via ORAL
  Filled 2021-07-10 (×2): qty 2

## 2021-07-10 MED ORDER — FERROUS SULFATE 325 (65 FE) MG PO TABS
325.0000 mg | ORAL_TABLET | ORAL | Status: DC
  Administered 2021-07-10 – 2021-07-12 (×2): 325 mg via ORAL
  Filled 2021-07-10 (×2): qty 1

## 2021-07-10 MED ORDER — PRENATAL MULTIVITAMIN CH
1.0000 | ORAL_TABLET | Freq: Every day | ORAL | Status: DC
  Administered 2021-07-10 – 2021-07-11 (×2): 1 via ORAL
  Filled 2021-07-10 (×2): qty 1

## 2021-07-10 MED ORDER — METHYLERGONOVINE MALEATE 0.2 MG PO TABS: 0.2000 mg | ORAL_TABLET | ORAL | Status: DC | PRN

## 2021-07-10 MED ORDER — SIMETHICONE 80 MG PO CHEW: 80.0000 mg | CHEWABLE_TABLET | ORAL | Status: DC | PRN

## 2021-07-10 MED ORDER — ONDANSETRON HCL 4 MG PO TABS: 4.0000 mg | ORAL_TABLET | ORAL | Status: DC | PRN

## 2021-07-10 MED ORDER — MEDROXYPROGESTERONE ACETATE 150 MG/ML IM SUSP: 150.0000 mg | INTRAMUSCULAR | Status: DC | PRN

## 2021-07-10 MED ORDER — WITCH HAZEL-GLYCERIN EX PADS: 1.0000 "application " | MEDICATED_PAD | CUTANEOUS | Status: DC | PRN

## 2021-07-10 MED ORDER — DIPHENHYDRAMINE HCL 25 MG PO CAPS: 25.0000 mg | ORAL_CAPSULE | Freq: Four times a day (QID) | ORAL | Status: DC | PRN

## 2021-07-10 MED ORDER — IBUPROFEN 600 MG PO TABS
600.0000 mg | ORAL_TABLET | Freq: Four times a day (QID) | ORAL | Status: DC
  Administered 2021-07-10 – 2021-07-12 (×8): 600 mg via ORAL
  Filled 2021-07-10 (×9): qty 1

## 2021-07-10 MED ORDER — MEASLES, MUMPS & RUBELLA VAC IJ SOLR: 0.5000 mL | Freq: Once | INTRAMUSCULAR | Status: DC

## 2021-07-10 MED ORDER — DIBUCAINE (PERIANAL) 1 % EX OINT: 1.0000 "application " | TOPICAL_OINTMENT | CUTANEOUS | Status: DC | PRN

## 2021-07-10 MED ORDER — LACTATED RINGERS IV SOLN: INTRAVENOUS | Status: DC

## 2021-07-10 MED ORDER — DOCUSATE SODIUM 100 MG PO CAPS
100.0000 mg | ORAL_CAPSULE | Freq: Two times a day (BID) | ORAL | Status: DC
  Administered 2021-07-11: 100 mg via ORAL
  Filled 2021-07-10 (×3): qty 1

## 2021-07-10 MED ORDER — ONDANSETRON HCL 4 MG/2ML IJ SOLN: 4.0000 mg | INTRAMUSCULAR | Status: DC | PRN

## 2021-07-10 MED ORDER — BENZOCAINE-MENTHOL 20-0.5 % EX AERO: 1.0000 "application " | INHALATION_SPRAY | CUTANEOUS | Status: DC | PRN

## 2021-07-10 NOTE — Plan of Care (Signed)
?  Problem: Education: ?Goal: Knowledge of General Education information will improve ?Description: Including pain rating scale, medication(s)/side effects and non-pharmacologic comfort measures ?Outcome: Progressing ?  ?Problem: Health Behavior/Discharge Planning: ?Goal: Ability to manage health-related needs will improve ?Outcome: Progressing ?  ?Problem: Clinical Measurements: ?Goal: Ability to maintain clinical measurements within normal limits will improve ?Outcome: Progressing ?Goal: Will remain free from infection ?Outcome: Progressing ?Goal: Diagnostic test results will improve ?Outcome: Progressing ?Goal: Respiratory complications will improve ?Outcome: Progressing ?Goal: Cardiovascular complication will be avoided ?Outcome: Progressing ?  ?Problem: Activity: ?Goal: Risk for activity intolerance will decrease ?Outcome: Progressing ?  ?Problem: Nutrition: ?Goal: Adequate nutrition will be maintained ?Outcome: Progressing ?  ?Problem: Coping: ?Goal: Level of anxiety will decrease ?Outcome: Progressing ?  ?Problem: Elimination: ?Goal: Will not experience complications related to bowel motility ?Outcome: Progressing ?Goal: Will not experience complications related to urinary retention ?Outcome: Progressing ?  ?Problem: Pain Managment: ?Goal: General experience of comfort will improve ?Outcome: Progressing ?  ?Problem: Safety: ?Goal: Ability to remain free from injury will improve ?Outcome: Progressing ?  ?Problem: Skin Integrity: ?Goal: Risk for impaired skin integrity will decrease ?Outcome: Progressing ?  ?Problem: Education: ?Goal: Knowledge of condition will improve ?Outcome: Progressing ?Goal: Individualized Educational Video(s) ?Outcome: Progressing ?Goal: Individualized Newborn Educational Video(s) ?Outcome: Progressing ?  ?Problem: Activity: ?Goal: Will verbalize the importance of balancing activity with adequate rest periods ?Outcome: Progressing ?Goal: Ability to tolerate increased activity will  improve ?Outcome: Progressing ?  ?Problem: Coping: ?Goal: Ability to identify and utilize available resources and services will improve ?Outcome: Progressing ?  ?Problem: Life Cycle: ?Goal: Chance of risk for complications during the postpartum period will decrease ?Outcome: Progressing ?  ?Problem: Role Relationship: ?Goal: Ability to demonstrate positive interaction with newborn will improve ?Outcome: Progressing ?  ?Problem: Skin Integrity: ?Goal: Demonstration of wound healing without infection will improve ?Outcome: Progressing ?  ?Problem: Education: ?Goal: Knowledge of disease or condition will improve ?Outcome: Progressing ?Goal: Knowledge of the prescribed therapeutic regimen will improve ?Outcome: Progressing ?  ?Problem: Fluid Volume: ?Goal: Peripheral tissue perfusion will improve ?Outcome: Progressing ?  ?Problem: Clinical Measurements: ?Goal: Complications related to disease process, condition or treatment will be avoided or minimized ?Outcome: Progressing ?  ?

## 2021-07-10 NOTE — Progress Notes (Addendum)
LABOR PROGRESS NOTE ? ?Subjective: Patient is reporting pelvic pressure without discomfort at bs.  ? ?Objective: ?Blood pressure 140/88, pulse 88, temperature 98.3 ?F (36.8 ?C), temperature source Oral, resp. rate 18, height 5\' 5"  (1.651 m), weight 112.1 kg, last menstrual period 10/30/2020, SpO2 99 %. ?Temp (24hrs), Avg:98.1 ?F (36.7 ?C), Min:97.7 ?F (36.5 ?C), Max:98.6 ?F (37 ?C) ? ? ?Fetal Heart Tones ?Baseline: 120 ?Variability: moderate ?Accelerations: none ?Decelerations: present; early, variable ?Overall assessment: cat 2 ? ?Contractions: q3.5-4.5 min ? ?Cervical Exam: 9.5/90%/+1 per RN  ? ?ASSESSMENT AND PLAN:   ?IUP at [redacted]w[redacted]d in active labor pattern ?Contine current management ? ?Anticipate SVD ? ?Electronically Signed By: ?[redacted]w[redacted]d, SNM ?07/10/21 2:29 AM  ? ?Attestation: ? ?I confirm that I have verified the information documented in the  SNM ?s note and that I have also personally reperformed the physical exam and all medical decision making activities.  ? ?07/12/21, CNM ?  ?

## 2021-07-10 NOTE — Lactation Note (Signed)
This note was copied from a baby's chart. ?Lactation Consultation Note ? ?Patient Name: Roberta Guerrero ?Today's Date: 07/10/2021 ?Reason for consult: Initial assessment;Primapara;1st time breastfeeding;Late-preterm 34-36.6wks;Infant < 6lbs ?Age:26 hours ? ?Maternal Data ?Has patient been taught Hand Expression?: Yes ?Does the patient have breastfeeding experience prior to this delivery?: No ? ?Lactation conducted an initial consult with Ms. Landry Mellow and baby "[place]." Lactation visited Ms. [place] in L&D, and then attempted to follow up earlier in the day, but this was our first full consult. ? ?Ms. Folck states that baby last ate around 2.5 hours ago. I showed them how to use their feeding log to record feeds, voids and stools. I assisted dad with changing a meconium diaper, and then we placed baby in football hold on the left side. I reviewed hand expression. Ms. Blicharz has copious colostrum. Her spouse was helpful in expressing her milk, and I showed them how to finger feed it to baby. ? ?Baby was too sleepy to latch. I reviewed LPI guidelines and recommended supplementing baby with 10 mls. Parents are choosing to use Similac. They have been educated about donor breast milk. ? ?I then helped Ms. Roethler use her DEBP. Crystal, RN, assisted with placement of a top to hold her flanges in place. I shared the pump settings with Ms. Golda and observed her pump. I provided reassurance regarding her milk volumes and provided education on what to expect on days 1-3 of pumping. ? ?Plan: ?Breast feed on demand 8-12 times a day. Wake baby to feed, as needed if sleeping longer than 3-4 hours. ?Supplement following the LPI guidelines (green sheet) which was provided and reviewed. ?Pump q 3 hours to stimulate the breasts. ? ?Feeding ?Mother's Current Feeding Choice: Breast Milk and Formula ?Nipple Type: Slow - flow ? ?LATCH Score ?Latch: Too sleepy or reluctant, no latch achieved, no sucking elicited. ? ?Audible Swallowing: None ? ?Type  of Nipple: Everted at rest and after stimulation ? ?Comfort (Breast/Nipple): Soft / non-tender ? ?Hold (Positioning): Assistance needed to correctly position infant at breast and maintain latch. ? ?LATCH Score: 5 ? ?Lactation Tools Discussed/Used ?Tools: Pump;Flanges;Hands-free pumping top ?Flange Size: 24;27 (started with 24, but 27 may work better) ?Breast pump type: Double-Electric Breast Pump ?Pump Education: Setup, frequency, and cleaning ?Reason for Pumping: LPI; baby is too sleepy to latch ?Pumping frequency: rec q3 hours ?Pumped volume: 0 mL ? ?Interventions ?Interventions: Breast feeding basics reviewed;Assisted with latch;Skin to skin;Hand express;Adjust position;Support pillows;DEBP;Expressed milk;Education;LC Services brochure ? ?Discharge ?Pump:  (states that she has ordered from from her insurance and it's on the way - possibly a Medela pump) ? ?Consult Status ?Consult Status: Follow-up ?Date: 07/11/21 ?Follow-up type: In-patient ? ? ? ?Lenore Manner ?07/10/2021, 3:54 PM ? ? ? ?

## 2021-07-10 NOTE — Progress Notes (Signed)
Chaplain responded to page for neonatal code blue at 334 am.  When chaplain arrived, nurse informed her that baby was okay.   ?Rev. [place] Roann Merk ?Pager [phone] ?

## 2021-07-10 NOTE — Discharge Summary (Signed)
? ?  Postpartum Discharge Summary ? ?Date of Service updated: 07/12/21 ? ?   ?Patient Name: Roberta Guerrero ?DOB: 1995/04/17 ?MRN: [id number] ? ?Date of admission: 07/07/2021 ?Delivery date:07/10/2021  ?Delivering provider: Hansel Feinstein L  ?Date of discharge: 07/12/2021 ? ?Admitting diagnosis: Severe preeclampsia, third trimester [O14.13] ?Intrauterine pregnancy: [redacted]w[redacted]d    ?Secondary diagnosis:  Principal Problem: ?  Severe preeclampsia, third trimester ?Active Problems: ?  Chronic hypertension ?  Supervision of high-risk pregnancy ?  GBS bacteriuria ? ?Additional problems: short umbilical cord, mild shoulder dystocia likely due to low fetal tone    ?Discharge diagnosis: Preterm Pregnancy Delivered, CHTN with superimposed preeclampsia, and mild shoulder dystocia and short umbilical cord                                               ?Post partum procedures: Magnesium x 24 hrs ?Augmentation: AROM, Pitocin, Cytotec, and IP Foley ?Complications: None ? ?Hospital course: Induction of Labor With Vaginal Delivery   ?26y.o. yo G1P0000 at 336w1das admitted to the hospital 07/07/2021 for induction of labor.  Indication for induction: Preeclampsia.  Patient had an uncomplicated labor course as follows: ?Membrane Rupture Time/Date: 8:40 PM ,07/08/2021   ?Delivery Method:Vaginal, Spontaneous  ?Episiotomy: None  ?Lacerations:  Labial  ?Details of delivery can be found in separate delivery note.  Patient had a routine postpartum course. She received magnesium x 24 hrs. Started on Procardia for BP BP/ She had good response.  She progressed to ambulating, voiding and tolerating diet.  Patient is discharged home 07/12/21. ? ?Newborn Data: ?Birth date:07/10/2021  ?Birth time:3:32 AM  ?Gender:Female  ?Living status:Living  ?Apgars:8 ,9  ?WeMPNTIR:[id number]  ? ?Magnesium Sulfate received: Yes: Seizure prophylaxis ?BMZ received: No ?Rhophylac:N/A ?MMR:No ?T-DaP:Given prenatally ?Flu: No ?Transfusion:No ? ?Physical exam  ?Vitals:  ? 07/11/21 1955  07/11/21 2358 07/12/21 0400 07/12/21 0858  ?BP: 138/90 138/77 129/78 (!) 159/103  ?Pulse: 79 81 81 77  ?Resp: _0 ?Temp: 98.1 ?F (36.7 ?C) 98.8 ?F (37.1 ?C) 98.3 ?F (36.8 ?C)   ?TempSrc: Oral Oral Oral   ?SpO2: 98% 99% 99%   ?Weight:      ?Height:      ? ?General: alert ?Lochia: appropriate ?Uterine Fundus: firm ?Incision: Healing well with no significant drainage ?DVT Evaluation: No evidence of DVT seen on physical exam. ?Labs: ?Lab Results  ?Component Value Date  ? WBC 14.7 (H) 07/10/2021  ? HGB 11.4 (L) 07/10/2021  ? HCT 33.2 (L) 07/10/2021  ? MCV 83.2 07/10/2021  ? PLT 231 07/10/2021  ? ? ?  Latest Ref Rng & Units 07/10/2021  ?  5:02 AM  ?CMP  ?Glucose 70 - 99 mg/dL 120    ?BUN 6 - 20 mg/dL <5    ?Creatinine 0.44 - 1.00 mg/dL 0.65    ?Sodium 135 - 145 mmol/L 130    ?Potassium 3.5 - 5.1 mmol/L 3.9    ?Chloride 98 - 111 mmol/L 101    ?CO2 22 - 32 mmol/L 21    ?Calcium 8.9 - 10.3 mg/dL 6.7    ?Total Protein 6.5 - 8.1 g/dL 6.5    ?Total Bilirubin 0.3 - 1.2 mg/dL 0.5    ?Alkaline Phos 38 - 126 U/L 91    ?AST 15 - 41 U/L 32    ?ALT 0 - 44 U/L 46    ? ?  Edinburgh Score: ? ?  07/10/2021  ?  5:00 PM  ?Edinburgh Postnatal Depression Scale Screening Tool  ?I have been able to laugh and see the funny side of things. 0  ?I have looked forward with enjoyment to things. 0  ?I have blamed myself unnecessarily when things went wrong. 0  ?I have been anxious or worried for no good reason. 2  ?I have felt scared or panicky for no good reason. 0  ?Things have been getting on top of me. 0  ?I have been so unhappy that I have had difficulty sleeping. 0  ?I have felt sad or miserable. 0  ?I have been so unhappy that I have been crying. 0  ?The thought of harming myself has occurred to me. 0  ?Edinburgh Postnatal Depression Scale Total 2  ? ? ? ? ?After visit meds:  ?Allergies as of 07/12/2021   ? ?   Reactions  ? Other Rash  ? Nuts  ? ?  ? ?  ?Medication List  ?  ? ?STOP taking these medications   ? ?aspirin EC 81 MG tablet ?   ?Pulmicort Flexhaler 180 MCG/ACT inhaler ?Generic drug: budesonide ?  ?triamcinolone ointment 0.1 % ?Commonly known as: KENALOG ?  ? ?  ? ?TAKE these medications   ? ?AeroChamber Plus inhaler ?Use with inhaler ?  ?albuterol 108 (90 Base) MCG/ACT inhaler ?Commonly known as: Ventolin HFA ?Inhale 2 puffs into the lungs every 6 (six) hours as needed for wheezing or shortness of breath. ?  ?ferrous sulfate 325 (65 FE) MG tablet ?Take 1 tablet (325 mg total) by mouth every other day. ?  ?ibuprofen 600 MG tablet ?Commonly known as: ADVIL ?Take 1 tablet (600 mg total) by mouth every 6 (six) hours. ?  ?NIFEdipine 30 MG 24 hr tablet ?Commonly known as: ADALAT CC ?Take 1 tablet (30 mg total) by mouth daily. ?Start taking on: July 13, 2021 ?  ?triamterene-hydrochlorothiazide 37.5-25 MG tablet ?Commonly known as: MAXZIDE-25 ?Take 1 tablet by mouth daily. ?  ? ?  ? ?Please schedule this patient for Postpartum visit in: 1 week with the following provider: Any provider ?In-Person ?For C/S patients schedule nurse incision check in weeks 2 weeks: no ?High risk pregnancy complicated by: HTN ?Delivery mode:  SVD ?Anticipated Birth Control:  other/unsure ?PP Procedures needed: BP check  ?Edinburgh: negative Schedule Integrated BH visit: no  ?no relevant baby issues ? ? ?Discharge home in stable condition ?Infant Feeding: Breast ?Infant Disposition:home with mother ?Discharge instruction: per After Visit Summary and Postpartum booklet. ?Activity: Advance as tolerated. Pelvic rest for 6 weeks.  ?Diet: routine diet ?Anticipated Birth Control: Unsure ?Postpartum Appointment:1 week ?Additional Postpartum F/U: BP check 1 week ?Future Appointments: ?Future Appointments  ?Date Time Provider [place]  ?07/24/2021 11:10 AM Janyth Pupa, DO [facility]  ?08/14/2021 10:10 AM Janyth Pupa, DO [facility]  ? ?Follow up Visit: ? Follow-up Information   ? ? [facility] Follow up.   ?Specialty: Obstetrics and Gynecology ?Why: 1  week for BP check ?4 weeks for PP visit ?Contact information: ?[address] [place] ?[phone] ? ?  ?  ? ?  ?  ? ?  ? ? ? ?  ? ?07/12/2021 ?Chancy Milroy, MD ? ? ?

## 2021-07-10 NOTE — Lactation Note (Signed)
This note was copied from a baby's chart. ?Lactation Consultation Note ?Baby totally has no interest in BF at this time. Baby has mucous in mouth. LC used bulb syring to clear bubbles from mouth. ?Attempted to express colostrum in baby's mouth baby wouldn't open mouth or suck. ?Encouraged mom to cont. STS and try to BF when baby is more alert. ?Mom encouraged to feed baby 8-12 times/24 hours and with feeding cues.   ?Mom will be f/u today. ? ?Patient Name: Roberta Guerrero ?Today's Date: 07/10/2021 ?Reason for consult: L&D Initial assessment;Primapara;Late-preterm 34-36.6wks ?Age:82 hours ? ?Maternal Data ?  ? ?Feeding ?  ? ?LATCH Score ?Latch: Too sleepy or reluctant, no latch achieved, no sucking elicited. ? ?Audible Swallowing: None ? ?Type of Nipple: Everted at rest and after stimulation ? ?Comfort (Breast/Nipple): Soft / non-tender ? ?Hold (Positioning): Full assist, staff holds infant at breast ? ?LATCH Score: 4 ? ? ?Lactation Tools Discussed/Used ?  ? ?Interventions ?Interventions: Skin to skin;Assisted with latch;Hand express;Support pillows ? ?Discharge ?  ? ?Consult Status ?Consult Status: Follow-up from L&D ?Date: 07/10/21 ?Follow-up type: In-patient ? ? ? ?Charyl Dancer ?07/10/2021, 4:33 AM ? ? ? ?

## 2021-07-10 NOTE — Anesthesia Postprocedure Evaluation (Signed)
Anesthesia Post Note ? ?Patient: Roberta Guerrero ? ?Procedure(s) Performed: AN AD HOC LABOR EPIDURAL ? ?  ? ?Patient location during evaluation: Mother Baby ?Anesthesia Type: Epidural ?Level of consciousness: awake and alert and oriented ?Pain management: satisfactory to patient ?Vital Signs Assessment: post-procedure vital signs reviewed and stable ?Respiratory status: respiratory function stable ?Cardiovascular status: stable ?Postop Assessment: no headache, no backache, epidural receding, patient able to bend at knees, no signs of nausea or vomiting, adequate PO intake and able to ambulate ?Anesthetic complications: no ? ? ?No notable events documented. ? ?Last Vitals:  ?Vitals:  ? 07/10/21 1603 07/10/21 1705  ?BP: 129/73   ?Pulse: 81   ?Resp: 18 17  ?Temp: 36.6 ?C   ?SpO2: 98%   ?  ?Last Pain:  ?Vitals:  ? 07/10/21 1704  ?TempSrc:   ?PainSc: 0-No pain  ? ?Pain Goal: Patients Stated Pain Goal: 0 (07/10/21 0545) ? ?  ?  ?  ?  ?  ?  ?  ? ?[name] ? ? ? ? ?

## 2021-07-10 NOTE — Lactation Note (Signed)
This note was copied from a baby's chart. ?Lactation Consultation Note ? ?Patient Name: Roberta Guerrero ?Today's Date: 07/10/2021 ?  ?Lactation attempted to visit [name]. She was asleep upon entry. I spoke with her support person and asked if they would page when she's ready to be seen. I followed up with the RN, Crystal, and I asked her to contact me when [name] is awake. Baby is receiving DBM.  ?  ? ?Consult Status ?Consult Status: Follow-up ?Date: 07/10/21 ?Follow-up type: In-patient ? ? ? ?Walker Shadow ?07/10/2021, 12:41 PM ? ? ? ?

## 2021-07-11 ENCOUNTER — Other Ambulatory Visit: Payer: Medicaid Other

## 2021-07-11 LAB — TYPE AND SCREEN
ABO/RH(D): O POS
Antibody Screen: NEGATIVE

## 2021-07-11 MED ORDER — TRIAMTERENE-HCTZ 37.5-25 MG PO TABS
1.0000 | ORAL_TABLET | Freq: Every day | ORAL | Status: DC
  Administered 2021-07-11 – 2021-07-12 (×2): 1 via ORAL
  Filled 2021-07-11 (×2): qty 1

## 2021-07-11 MED ORDER — NIFEDIPINE ER OSMOTIC RELEASE 30 MG PO TB24
30.0000 mg | ORAL_TABLET | Freq: Every day | ORAL | Status: DC
  Administered 2021-07-11 – 2021-07-12 (×2): 30 mg via ORAL
  Filled 2021-07-11 (×2): qty 1

## 2021-07-11 NOTE — Lactation Note (Addendum)
This note was copied from a baby's chart. ?Lactation Consultation Note ? ?Patient Name: Roberta Guerrero ?Today's Date: 07/11/2021 ?Reason for consult: Follow-up assessment;Mother's request;Difficult latch;Primapara;1st time breastfeeding;Late-preterm 34-36.6wks;Infant < 6lbs;Infant weight loss;Breastfeeding assistance;Other (Comment) (PIH) ?Age:26 hours ? ?Mom working on increasing volume with q 3hr feedings. LC attempted a latch in cross cradle prone but infant arching back and not able to sustain it. Mom offer some formula calm her down then try a latch.  ? ?Best position to get depth on breast cross cradle prone with Mom reclined.  ? ?Mom to call for latch assistance with next feeding.  ? ?LPTI guidelines reviewed to reduce calorie loss.  ?Plan 1. To feed based on cues 8-12x 24hr period.  ?2. Mom to offer breast and look for signs of milk transfer.  ?3. Mom to supplement with EBM first followed by formula with pace bottle feeding and slow flow nipple.  ?4. DEBP q 3hrs for 15 min  ?All questions answered at the end of the visit.  ?Mom offer breast and bottle for one feeding and bottle for next, to reduce calorie loss.  ? ?Maternal Data ?Has patient been taught Hand Expression?: Yes ? ?Feeding ?Mother's Current Feeding Choice: Breast Milk and Formula ?Nipple Type: Slow - flow ? ?LATCH Score ?  ? ?  ? ?  ? ?  ? ?  ? ?  ? ? ?Lactation Tools Discussed/Used ?Tools: Pump;Flanges ?Breast pump type: Double-Electric Breast Pump ?Pump Education: Setup, frequency, and cleaning;Milk Storage ?Reason for Pumping: increase stimulation ?Pumping frequency: q 3hrs for 15 min ? ?Interventions ?Interventions: Breast feeding basics reviewed;Assisted with latch;Skin to skin;Breast massage;Hand express;Breast compression;Adjust position;Support pillows;Position options;Expressed milk;DEBP;Education;Pace feeding;Infant Driven Feeding Algorithm education;LPT handout/interventions ? ?Discharge ?  ? ?Consult Status ?Consult Status:  Follow-up ?Date: 07/12/21 ?Follow-up type: In-patient ? ? ? ?Roberta Asbridge  Guerrero ?07/11/2021, 4:16 PM ? ? ? ?

## 2021-07-11 NOTE — Lactation Note (Addendum)
This note was copied from a baby's chart. ?Lactation Consultation Note ? ?Patient Name: Roberta Guerrero ?Today's Date: 07/11/2021 ?Reason for consult: Follow-up assessment;Mother's request;Difficult latch;1st time breastfeeding;Late-preterm 34-36.6wks;Infant < 6lbs;Infant weight loss (LPTI, high weight loss -8%, mom mostly been formula feeding but wants to BF infant. Mom fitted with 24 mm NS due infant having multple bottle nipples and mot previously latching at the breast.) ?Age:26 hours ?Mom made attempts to latch infant on her right breast using the football hold position, infant arched back and would not sustained latch. ?Mom fitted with 24 mm NS, pre-filled with 0.5 mls of formula and re-latched infant on right breast with football hold position, infant latched with depth and sustained latch with swallows infant BF for 12 minutes. ?Afterwards dad pace feed infant 35 mls of formula with slow flow ( yellow bottle nipple). ?Per mom, she has not been using DEBP, LC discussed the importance of pumping to help stimulate and establish milk supply. ?Mom open to pumping, LC re-fitted mom with 27 mm breast flange which is best fit, reviewed hand expression, mom hand expressed and was still pumping, she expressed 11 mls of EBM in flanges, LC left room. ?Mom understands EBM is safe at room temperature for 4 hours whereas formula is safe up to 1 hour.  ?LC suggested  once mom is discharge to use community resources: mom to participate in online BF support group, call LC hotline, [facility] outpatient clinic or [facility] support group with free clinic ?Mom's plan: ?1- Mom will apply 24 mm NS prior to latching infant at breast and will follow LPTI feeding policy and limit total feedings to 30 minutes or less. ?2- Mom will offer her EBM first that she hand expressed or pumped and then formula after latch infant at breast, mom will offer total 35 mls + per feeding due to high infant  weight loss. ?3- Mom will continue to use DEBP every  3 hours for 15 minutes on initial setting. ?4- Mom will call RN if she need further latch asisstance or help with apply 24 mm NS.  ?Maternal Data ?  ? ?Feeding ?Mother's Current Feeding Choice: Breast Milk and Formula ? ?LATCH Score ?Latch: Grasps breast easily, tongue down, lips flanged, rhythmical sucking. (Infant stopped arching and sustained latch with 24 mm NS.) ? ?Audible Swallowing: Spontaneous and intermittent ? ?Type of Nipple: Everted at rest and after stimulation ? ?Comfort (Breast/Nipple): Soft / non-tender ? ?Hold (Positioning): Assistance needed to correctly position infant at breast and maintain latch. ? ?LATCH Score: 9 ? ? ?Lactation Tools Discussed/Used ?Tools: Pump ?Flange Size: 27 (Mom re-sized with 27 mm breast flange) ?Breast pump type: Double-Electric Breast Pump ?Pump Education: Setup, frequency, and cleaning;Milk Storage ?Reason for Pumping: LPTI, previously not latching at breast and mom using 24 mm NS. ?Pumping frequency: Mom will start pumping every 3 hours for 15 minutes on inital setting. ?Pumped volume: 11 mL (Mom expressed 11 mls of EBM with hand expression and pumping.) ? ?Interventions ?Interventions: Assisted with latch;Skin to skin;Breast compression;Adjust position;Support pillows;Position options;Expressed milk;Hand express;DEBP;Education ? ?Discharge ?  ? ?Consult Status ?Consult Status: Follow-up ?Date: 07/12/21 ?Follow-up type: In-patient ? ? ? ?Danelle Earthly ?07/11/2021, 9:48 PM ? ? ? ?

## 2021-07-11 NOTE — Progress Notes (Signed)
Post Partum Day 1 TSVD, IOL for CHTN SISPEC ?Subjective: ?Pt has no complaints this morning. Sitting up on couch with family. Denies HA or visual changes. ?Tolerating diet. Voiding without problems. Pain controlled ? ?Objective: ?Blood pressure (!) 144/85, pulse 77, temperature 98.6 ?F (37 ?C), temperature source Oral, resp. rate 18, height 5\' 5"  (1.651 m), weight 112.1 kg, last menstrual period 10/30/2020, SpO2 96 %, unknown if currently breastfeeding. ? ?Physical Exam:  ?General: alert ?Lochia: appropriate ?Uterine Fundus: firm ?Incision: healing well ?DVT Evaluation: No evidence of DVT seen on physical exam. ? ?Recent Labs  ?  07/09/21 ?1815 07/10/21 ?0502  ?HGB 11.1* 11.4*  ?HCT 34.1* 33.2*  ? ? ?Assessment/Plan: ?Stable. S/P Magnesium. BP slight elevated. Will start Procardia and Maxzide. Reviewed with pt. Plan discharge home tomorrow if BP stable. ? ? LOS: 4 days  ? ?07/12/21 ?07/11/2021, 11:52 AM  ? ? ?

## 2021-07-11 NOTE — Plan of Care (Signed)
?  Problem: Education: ?Goal: Knowledge of General Education information will improve ?Description: Including pain rating scale, medication(s)/side effects and non-pharmacologic comfort measures ?Outcome: Progressing ?  ?Problem: Health Behavior/Discharge Planning: ?Goal: Ability to manage health-related needs will improve ?Outcome: Progressing ?  ?Problem: Clinical Measurements: ?Goal: Ability to maintain clinical measurements within normal limits will improve ?Outcome: Progressing ?Goal: Will remain free from infection ?Outcome: Progressing ?Goal: Diagnostic test results will improve ?Outcome: Progressing ?Goal: Respiratory complications will improve ?Outcome: Progressing ?Goal: Cardiovascular complication will be avoided ?Outcome: Progressing ?  ?Problem: Activity: ?Goal: Risk for activity intolerance will decrease ?Outcome: Progressing ?  ?Problem: Nutrition: ?Goal: Adequate nutrition will be maintained ?Outcome: Progressing ?  ?Problem: Coping: ?Goal: Level of anxiety will decrease ?Outcome: Progressing ?  ?Problem: Elimination: ?Goal: Will not experience complications related to bowel motility ?Outcome: Progressing ?Goal: Will not experience complications related to urinary retention ?Outcome: Progressing ?  ?Problem: Pain Managment: ?Goal: General experience of comfort will improve ?Outcome: Progressing ?  ?Problem: Safety: ?Goal: Ability to remain free from injury will improve ?Outcome: Progressing ?  ?Problem: Skin Integrity: ?Goal: Risk for impaired skin integrity will decrease ?Outcome: Progressing ?  ?Problem: Education: ?Goal: Knowledge of condition will improve ?Outcome: Progressing ?Goal: Individualized Educational Video(s) ?Outcome: Progressing ?Goal: Individualized Newborn Educational Video(s) ?Outcome: Progressing ?  ?Problem: Activity: ?Goal: Will verbalize the importance of balancing activity with adequate rest periods ?Outcome: Progressing ?Goal: Ability to tolerate increased activity will  improve ?Outcome: Progressing ?  ?Problem: Coping: ?Goal: Ability to identify and utilize available resources and services will improve ?Outcome: Progressing ?  ?Problem: Life Cycle: ?Goal: Chance of risk for complications during the postpartum period will decrease ?Outcome: Progressing ?  ?Problem: Role Relationship: ?Goal: Ability to demonstrate positive interaction with newborn will improve ?Outcome: Progressing ?  ?Problem: Skin Integrity: ?Goal: Demonstration of wound healing without infection will improve ?Outcome: Progressing ?  ?Problem: Education: ?Goal: Knowledge of disease or condition will improve ?Outcome: Progressing ?Goal: Knowledge of the prescribed therapeutic regimen will improve ?Outcome: Progressing ?  ?Problem: Fluid Volume: ?Goal: Peripheral tissue perfusion will improve ?Outcome: Progressing ?  ?Problem: Clinical Measurements: ?Goal: Complications related to disease process, condition or treatment will be avoided or minimized ?Outcome: Progressing ?  ?

## 2021-07-11 NOTE — Lactation Note (Incomplete Revision)
This note was copied from a baby's chart. ?Lactation Consultation Note ? ?Patient Name: Roberta Guerrero ?Today's Date: 07/11/2021 ?Reason for consult: Follow-up assessment;Mother's request;Difficult latch;1st time breastfeeding;Late-preterm 34-36.6wks;Infant < 6lbs;Infant weight loss (LPTI, high weight loss -8%, mom mostly been formula feeding but wants to BF infant. Mom fitted with 24 mm NS due infant having multple bottle nipples and mot previously latching at the breast.) ?Age:42 hours ?Mom made attempts to latch infant on her right breast using the football hold position, infant arched back and would not sustained latch. ?Mom fitted with 24 mm NS, pre-filled with 0.5 mls of formula and re-latched infant on right breast with football hold position, infant latched with depth and sustained latch with swallows infant BF for 12 minutes. ?Afterwards dad pace feed infant 35 mls of formula with slow flow ( yellow bottle nipple). ?Per mom, she has not been using DEBP, LC discussed the importance of pumping to help stimulate and establish milk supply. ?Mom open to pumping, LC re-fitted mom with 27 mm breast flange which is best fit, reviewed hand expression, mom hand expressed and was still pumping, she expressed 11 mls of EBM in flanges, LC left room. ?Mom understands EBM is safe at room temperature for 4 hours whereas formula is safe up to 1 hour.  ?LC suggested  once mom is discharge to use community resources: mom to participate in online BF support group, call LC hotline, LC outpatient clinic or [facility] support group with free clinic ?Mom's plan: ?1- Mom will apply 24 mm NS prior to latching infant at breast and will follow LPTI feeding policy and limit total feedings to 30 minutes or less. ?2- Mom will offer her EBM first that she hand expressed or pumped and then formula after latch infant at breast, mom will offer total 35 mls + per feeding due to high infant  weight loss. ?3- Mom will continue to use DEBP every  3 hours for 15 minutes on initial setting. ?4- Mom will call RN if she need further latch asisstance or help with apply 24 mm NS.  ?Maternal Data ?  ? ?Feeding ?Mother's Current Feeding Choice: Breast Milk and Formula ? ?LATCH Score ?Latch: Grasps breast easily, tongue down, lips flanged, rhythmical sucking. (Infant stopped arching and sustained latch with 24 mm NS.) ? ?Audible Swallowing: Spontaneous and intermittent ? ?Type of Nipple: Everted at rest and after stimulation ? ?Comfort (Breast/Nipple): Soft / non-tender ? ?Hold (Positioning): Assistance needed to correctly position infant at breast and maintain latch. ? ?LATCH Score: 9 ? ? ?Lactation Tools Discussed/Used ?Tools: Pump ?Flange Size: 27 (Mom re-sized with 27 mm breast flange) ?Breast pump type: Double-Electric Breast Pump ?Pump Education: Setup, frequency, and cleaning;Milk Storage ?Reason for Pumping: LPTI, previously not latching at breast and mom using 24 mm NS. ?Pumping frequency: Mom will start pumping every 3 hours for 15 minutes on inital setting. ?Pumped volume: 11 mL (Mom expressed 11 mls of EBM with hand expression and pumping.) ? ?Interventions ?Interventions: Assisted with latch;Skin to skin;Breast compression;Adjust position;Support pillows;Position options;Expressed milk;Hand express;DEBP;Education ? ?Discharge ?  ? ?Consult Status ?Consult Status: Follow-up ?Date: 07/12/21 ?Follow-up type: In-patient ? ? ? ?Roberta Guerrero ?07/11/2021, 9:48 PM ? ? ? ?

## 2021-07-12 ENCOUNTER — Ambulatory Visit: Payer: Self-pay

## 2021-07-12 MED ORDER — NIFEDIPINE ER 30 MG PO TB24: 30.0000 mg | ORAL_TABLET | Freq: Every day | ORAL | 1 refills | Status: DC

## 2021-07-12 MED ORDER — IBUPROFEN 600 MG PO TABS: 600.0000 mg | ORAL_TABLET | Freq: Four times a day (QID) | ORAL | 0 refills | Status: AC

## 2021-07-12 MED ORDER — TRIAMTERENE-HCTZ 37.5-25 MG PO TABS: 1.0000 | ORAL_TABLET | Freq: Every day | ORAL | 0 refills | Status: DC

## 2021-07-12 NOTE — Lactation Note (Signed)
This note was copied from a baby's chart. ?Lactation Consultation Note ? ?Patient Name: Roberta Guerrero ?Today's Date: 07/12/2021 ?  ?Age:26 hours ? ? LC went in to see how feeding going. Mom and Dad went home and infant in care of grandmother. ?[place] checked in with RN Lynnda Child feeding going well. RN to check in with Mom when she returns to see if she would like to be seen today.  ?Mom has a plan established that she is following and going well according to RN and grandmother.  ? ?Maternal Data ?  ? ?Feeding ?Nipple Type: Slow - flow ? ?LATCH Score ?  ? ?  ? ?  ? ?  ? ?  ? ?  ? ? ?Lactation Tools Discussed/Used ?  ? ?Interventions ?  ? ?Discharge ?  ? ?Consult Status ?  ? ? ? ?Randale Carvalho  [name] ?07/12/2021, 1:17 PM ? ? ? ?

## 2021-07-14 ENCOUNTER — Encounter: Payer: Self-pay | Admitting: *Deleted

## 2021-07-15 ENCOUNTER — Encounter: Payer: Medicaid Other | Admitting: Women's Health

## 2021-07-16 ENCOUNTER — Encounter: Payer: Medicaid Other | Admitting: Advanced Practice Midwife

## 2021-07-16 ENCOUNTER — Other Ambulatory Visit: Payer: Medicaid Other

## 2021-07-16 ENCOUNTER — Other Ambulatory Visit: Payer: Self-pay | Admitting: Obstetrics and Gynecology

## 2021-07-16 LAB — SURGICAL PATHOLOGY

## 2021-07-17 ENCOUNTER — Ambulatory Visit: Payer: Medicaid Other

## 2021-07-17 ENCOUNTER — Ambulatory Visit (INDEPENDENT_AMBULATORY_CARE_PROVIDER_SITE_OTHER): Payer: Medicaid Other | Admitting: *Deleted

## 2021-07-17 DIAGNOSIS — I10 Essential (primary) hypertension: Secondary | ICD-10-CM

## 2021-07-17 NOTE — Progress Notes (Addendum)
? ?  NURSE VISIT- BLOOD PRESSURE CHECK ? ?SUBJECTIVE:  ?Roberta Guerrero is a 26 y.o. G88P0101 female here for BP check. She is postpartum, delivery date 07/10/21    ? ?HYPERTENSION ROS: ? ?Postpartum:  ?Severe headaches that don't go away with tylenol/other medicines: No  ?Visual changes (seeing spots/double/blurred vision) Yes  ?Severe pain under right breast breast or in center of upper chest No  ?Severe nausea/vomiting No  ?Taking medicines as instructed yes ? ? ?OBJECTIVE:  ?BP 139/83 (BP Location: Right Arm, Patient Position: Sitting, Cuff Size: Normal)   Pulse 60   Wt 233 lb (105.7 kg)   Breastfeeding Yes   BMI 38.77 kg/m?   ?Appearance alert, well appearing, and in no distress and oriented to person, place, and time. ? ?ASSESSMENT: ?Postpartum  blood pressure check ? ?PLAN: ?Discussed with Nigel Berthold, CNM   ?Recommendations: stop medicine 2 days before next visit   ?Follow-up: as scheduled  ? ?Roberta Guerrero  ?07/17/2021 ?11:58 AM ?Chart reviewed for nurse visit. Agree with plan of care.  ?Christin Fudge, CNM ?07/17/2021 4:21 PM  ? ?

## 2021-07-22 ENCOUNTER — Encounter: Payer: Medicaid Other | Admitting: Obstetrics & Gynecology

## 2021-07-22 ENCOUNTER — Other Ambulatory Visit: Payer: Medicaid Other

## 2021-07-24 ENCOUNTER — Encounter: Payer: Medicaid Other | Admitting: Obstetrics & Gynecology

## 2021-07-25 ENCOUNTER — Other Ambulatory Visit: Payer: Medicaid Other

## 2021-07-29 ENCOUNTER — Other Ambulatory Visit: Payer: Medicaid Other

## 2021-07-29 ENCOUNTER — Encounter: Payer: Medicaid Other | Admitting: Obstetrics & Gynecology

## 2021-08-06 ENCOUNTER — Telehealth: Payer: Self-pay

## 2021-08-06 NOTE — Telephone Encounter (Signed)
Roberta Guerrero conducted a home visit and called reporting an elevated BP reading of 142/94 @ approx 1200. Pt had not taken her Nifedipine as she usually takes it at 1400. She denies any headaches, chest pain, or N/V, but told Roberta Guerrero that she has continued seeing spots/floaters. Pt has no way to check BP at home. Spoke with Roberta Nipple PA who recommended to instruct pt regarding warning signs and keep appt on 5/1 as scheduled unless pt has symptoms. ? ?Called pt to inform her of [name]'s recommendations. Pt confirmed understanding. ?

## 2021-08-11 ENCOUNTER — Encounter: Payer: Self-pay | Admitting: Women's Health

## 2021-08-11 ENCOUNTER — Ambulatory Visit (INDEPENDENT_AMBULATORY_CARE_PROVIDER_SITE_OTHER): Payer: Medicaid Other | Admitting: Women's Health

## 2021-08-11 DIAGNOSIS — Z8759 Personal history of other complications of pregnancy, childbirth and the puerperium: Secondary | ICD-10-CM

## 2021-08-11 DIAGNOSIS — I10 Essential (primary) hypertension: Secondary | ICD-10-CM | POA: Diagnosis not present

## 2021-08-11 DIAGNOSIS — Z3009 Encounter for other general counseling and advice on contraception: Secondary | ICD-10-CM | POA: Diagnosis not present

## 2021-08-11 MED ORDER — NIFEDIPINE ER 60 MG PO TB24: 60.0000 mg | ORAL_TABLET | Freq: Every day | ORAL | 3 refills | Status: DC

## 2021-08-11 NOTE — Progress Notes (Signed)
? ?POSTPARTUM VISIT ?Patient name: Roberta Guerrero MRN [id number]  Date of birth: Jan 19, 1996 ?Chief Complaint:   ?Postpartum Care ? ?History of Present Illness:   ?Roberta Guerrero is a 26 y.o. G44P0101 African American female being seen today for a postpartum visit. She is 4 weeks postpartum following a spontaneous vaginal delivery at 36.1 gestational weeks. IOL: yes, for [facility] w/ superimposed severe pre-e. Anesthesia: epidural.  Laceration: none.  Complications: <8UXL shoulder dystocia resolved by mcrobers>grasping of Lt axilla. Inpatient contraception: no.   ?Pregnancy complicated by [facility] w/ superimposed severe pre-e . ?Tobacco use: former . Substance use disorder: no. ?Last pap smear: 06/06/20 and results were NILM w/ HRHPV not done. Next pap smear due: 2025 ?No LMP recorded. ? ?Postpartum course has been uncomplicated. D/C'd on procardia 4m daily, takes at 12-2 o'clock daily, so hasn't had yet today. BP has been up at home, even w/ taking meds per pt. Bleeding none. Bowel function is normal. Bladder function is normal. Urinary incontinence? no, fecal incontinence? no ?Patient is not sexually active. Last sexual activity: prior to birth of baby. Desired contraception: Condoms, declines phexxi.  Patient does want a pregnancy in the future.  Desired family size is 4 children. Has had 3 dreams she got pregnant w/ triplets.  ? Upstream - 08/11/21 0917   ? ?  ? Pregnancy Intention Screening  ? Does the patient want to become pregnant in the next year? Unsure   ? Does the patient's partner want to become pregnant in the next year? Unsure   ? Would the patient like to discuss contraceptive options today? No   ?  ? Contraception Wrap Up  ? Current Method No Contraceptive Precautions   ? End Method No Contraception Precautions   ? Contraception Counseling Provided Yes   ? ?  ?  ? ?  ? ?The pregnancy intention screening data noted above was reviewed. Potential methods of contraception were discussed. The patient elected to  proceed with No Contraception Precautions. ? ?Edinburgh Postpartum Depression Screening: negative ? Edinburgh Postnatal Depression Scale - 08/11/21 0916   ? ?  ? Edinburgh Postnatal Depression Scale:  In the Past 7 Days  ? I have been able to laugh and see the funny side of things. 0   ? I have looked forward with enjoyment to things. 0   ? I have blamed myself unnecessarily when things went wrong. 2   ? I have been anxious or worried for no good reason. 2   ? I have felt scared or panicky for no good reason. 0   ? Things have been getting on top of me. 0   ? I have been so unhappy that I have had difficulty sleeping. 0   ? I have felt sad or miserable. 0   ? I have been so unhappy that I have been crying. 0   ? The thought of harming myself has occurred to me. 0   ? Edinburgh Postnatal Depression Scale Total 4   ? ?  ?  ? ?  ?  ? ?  05/20/2021  ?  8:44 AM 01/30/2021  ?  9:36 AM 06/06/2020  ? 10:07 AM  ?GAD 7 : Generalized Anxiety Score  ?Nervous, Anxious, on Edge 0 0 0  ?Control/stop worrying 0 0 0  ?Worry too much - different things 0 0 0  ?Trouble relaxing 0 0 0  ?Restless 0 0 0  ?Easily annoyed or irritable 0 0 0  ?Afraid - awful  might happen 0 0 0  ?Total GAD 7 Score 0 0 0  ? ? ? ?Baby's course has been uncomplicated. Baby is feeding by breast and bottle: milk supply inadequate , pumping only. Infant has a pediatrician/family doctor? Yes.  Childcare strategy if returning to work/school: family.  Pt has material needs met for her and baby: Yes.   ?Review of Systems:   ?Pertinent items are noted in HPI ?Denies Abnormal vaginal discharge w/ itching/odor/irritation, headaches, visual changes, shortness of breath, chest pain, abdominal pain, severe nausea/vomiting, or problems with urination or bowel movements. ?Pertinent History Reviewed:  ?Reviewed past medical,surgical, obstetrical and family history.  ?Reviewed problem list, medications and allergies. ?OB History  ?Gravida Para Term Preterm AB Living  ?1 1 0 1 0 1   ?SAB IAB Ectopic Multiple Live Births  ?0 0 0 0 1  ?  ?# Outcome Date GA Lbr Len/2nd Weight Sex Delivery Anes PTL Lv  ?1 Preterm 07/10/21 [redacted]w[redacted]d/ 00:17 5 lb 12 oz (2.608 kg) F Vag-Spont EPI  LIV  ? ?Physical Assessment:  ? ?Vitals:  ? 08/11/21 0915 08/11/21 0919  ?BP: (!) 154/92 (!) 147/87  ?Pulse: 61 (!) 58  ?Weight: 242 lb (109.8 kg)   ?Body mass index is 40.27 kg/m?. ? ?     Physical Examination:  ? General appearance: alert, well appearing, and in no distress ? Mental status: alert, oriented to person, place, and time ? Skin: warm & dry  ? Cardiovascular: normal heart rate noted  ? Respiratory: normal respiratory effort, no distress  ? Breasts: deferred, no complaints  ? Abdomen: soft, non-tender  ? Pelvic: examination not indicated. Thin prep pap obtained: No ? Rectal: not examined ? Extremities: Edema: none  ? ?Chaperone: N/A   ?      ?No results found for this or any previous visit (from the past 24 hour(s)).  ?Assessment & Plan:  ?1) Postpartum exam ?2) 4 wks s/p [facility] severe [facility] ?3) breast & bottle feeding>milk tips given ?4) Depression screening ?5) Contraception counseling> plans condoms, declines phexxi ?6) ? Shoulder dystocia> 36wk, baby 5lb12oz, <113m, resolved by mcroberts and grasping Lt axilla ?7) CHTN>no meds immediate pre-pregnancy, increase procardia to 6082maily, f/u at end of week, take med at least 1hr before visit ? ?Essential components of care per ACOG recommendations: ? ?1.  Mood and well being:  ?If positive depression screen, discussed and plan developed.  ?If using tobacco we discussed reduction/cessation and risk of relapse ?If current substance abuse, we discussed and referral to local resources was offered.  ? ?2. Infant care and feeding:  ?If breastfeeding, discussed returning to work, pumping, breastfeeding-associated pain, guidance regarding return to fertility while lactating if not using another method. If needed,  patient was provided with a letter to be allowed to pump q 2-3hrs to support lactation in a private location with access to a refrigerator to store breastmilk.   ?Recommended that all caregivers be immunized for flu, pertussis and other preventable communicable diseases ?If pt does not have material needs met for her/baby, referred to local resources for help obtaining these. ? ?3. Sexuality, contraception and birth spacing ?Provided guidance regarding sexuality, management of dyspareunia, and resumption of intercourse ?Discussed avoiding interpregnancy interval <6mt33mand recommended birth spacing of 18 months ? ?4. Sleep and fatigue ?Discussed coping options for fatigue and sleep disruption ?Encouraged family/partner/community support of 4 hrs of uninterrupted sleep to help with mood and fatigue ? ?  5. Physical recovery  ?If pt had a C/S, assessed incisional pain and providing guidance on normal vs prolonged recovery ?If pt had a laceration, perineal healing and pain reviewed.  ?If urinary or fecal incontinence, discussed management and referred to PT or uro/gyn if indicated  ?Patient is safe to resume physical activity. Discussed attainment of healthy weight. ? ?6.  Chronic disease management ?Discussed pregnancy complications if any, and their implications for future childbearing and long-term maternal health. ?Review recommendations for prevention of recurrent pregnancy complications, such as 17 hydroxyprogesterone caproate to reduce risk for recurrent PTB not applicable, or aspirin to reduce risk of preeclampsia yes. ?Pt had GDM: no. If yes, 2hr GTT scheduled: not applicable. ?Reviewed medications and non-pregnant dosing including consideration of whether pt is breastfeeding using a reliable resource such as LactMed: yes ?Referred for f/u w/ PCP or subspecialist providers as indicated: not applicable ? ?7. Health maintenance ?Mammogram at 25yo or earlier if indicated ?Pap smears as indicated ? ?Meds:  ?Meds  ordered this encounter  ?Medications  ? NIFEdipine (ADALAT CC) 60 MG 24 hr tablet  ?  Sig: Take 1 tablet (60 mg total) by mouth daily.  ?  Dispense:  30 tablet  ?  Refill:  3  ?  Order Specific Question:   Su

## 2021-08-11 NOTE — Patient Instructions (Signed)
Tips To Increase Milk Supply Lots of water! Enough so that your urine is clear Plenty of calories, if you're not getting enough calories, your milk supply can decrease Breastfeed/pump often, every 2-3 hours x 20-30mins Fenugreek 3 pills 3 times a day, this may make your urine smell like maple syrup Mother's Milk Tea Lactation cookies, google for the recipe Real oatmeal Body Armor sports drinks Greater Than hydration drink  

## 2021-08-14 ENCOUNTER — Ambulatory Visit: Payer: Medicaid Other | Admitting: Obstetrics & Gynecology

## 2021-11-06 ENCOUNTER — Other Ambulatory Visit (HOSPITAL_COMMUNITY)
Admission: RE | Admit: 2021-11-06 | Discharge: 2021-11-06 | Disposition: A | Discharge status: Home / Self Care | Payer: Medicaid Other | Source: Ambulatory Visit | Attending: Obstetrics & Gynecology | Admitting: Obstetrics & Gynecology

## 2021-11-06 ENCOUNTER — Encounter: Payer: Self-pay | Admitting: Obstetrics & Gynecology

## 2021-11-06 ENCOUNTER — Ambulatory Visit (INDEPENDENT_AMBULATORY_CARE_PROVIDER_SITE_OTHER): Payer: Medicaid Other | Admitting: Obstetrics & Gynecology

## 2021-11-06 VITALS — BP 148/93 | HR 67 | Ht 65.0 in | Wt 261.2 lb

## 2021-11-06 DIAGNOSIS — N898 Other specified noninflammatory disorders of vagina: Secondary | ICD-10-CM | POA: Diagnosis not present

## 2021-11-06 DIAGNOSIS — Z3009 Encounter for other general counseling and advice on contraception: Secondary | ICD-10-CM | POA: Diagnosis not present

## 2021-11-06 DIAGNOSIS — Z30017 Encounter for initial prescription of implantable subdermal contraceptive: Secondary | ICD-10-CM

## 2021-11-06 DIAGNOSIS — Z3202 Encounter for pregnancy test, result negative: Secondary | ICD-10-CM | POA: Diagnosis not present

## 2021-11-06 LAB — POCT URINE PREGNANCY: Preg Test, Ur: NEGATIVE

## 2021-11-06 MED ORDER — ETONOGESTREL 68 MG ~~LOC~~ IMPL
68.0000 mg | DRUG_IMPLANT | Freq: Once | SUBCUTANEOUS | Status: AC
  Administered 2021-11-06: 68 mg via SUBCUTANEOUS

## 2021-11-06 NOTE — Progress Notes (Signed)
GYN VISIT Patient name: Roberta Guerrero MRN [id number]  Date of birth: Aug 08, 1995 Chief Complaint:   Contraception  History of Present Illness:   Roberta Guerrero is a 26 y.o. G73P0101 female being seen today for the following concerns:  -Contraceptive management: Recently had a baby and wishes to discuss contraception.  Previously had Nexplanon, overall happy with device.  No bleeding, some hormonal changes, but tolerable.  -Vaginal odor: Recently noted vaginal odor during IC and wants to check for infection.  Denies irregular discharge.  Denies pelvic or abdominal pain.  She has not tried any medication.  No urinary concerns.  No other acute complaint  Patient's last menstrual period was 10/22/2021.     05/20/2021    8:44 AM 01/30/2021    9:36 AM 06/06/2020   10:06 AM 07/14/2016    4:25 PM  Depression screen PHQ 2/9  Decreased Interest 0 0 0 0  Down, Depressed, Hopeless 0 0 0 0  PHQ - 2 Score 0 0 0 0  Altered sleeping 1 1 0   Tired, decreased energy 2 3 2    Change in appetite 0 2 0   Feeling bad or failure about yourself  0 0 1   Trouble concentrating 0 0 0   Moving slowly or fidgety/restless 0 0 0   Suicidal thoughts 0 0 0   PHQ-9 Score 3 6 3       Review of Systems:   Pertinent items are noted in HPI Denies fever/chills, dizziness, headaches, visual disturbances, fatigue, shortness of breath, chest pain, abdominal pain, vomiting, no problems with periods, bowel movements, urination, or intercourse unless otherwise stated above.  Pertinent History Reviewed:  Reviewed past medical,surgical, social, obstetrical and family history.  Reviewed problem list, medications and allergies. Physical Assessment:   Vitals:   11/06/21 1431  BP: (!) 148/93  Pulse: 67  Weight: 261 lb 3.2 oz (118.5 kg)  Height: 5\' 5"  (1.651 m)  Body mass index is 43.47 kg/m.       Physical Examination:   General appearance: alert, well appearing, and in no distress  Psych: mood appropriate, normal  affect  Skin: warm & dry   Cardiovascular: normal heart rate noted  Respiratory: normal respiratory effort, no distress  Pelvic: VULVA: normal appearing vulva with no masses, tenderness or lesions, VAGINA: frothy white to yellow discharge noted, CERVIX: normal appearing cervix without discharge or lesions  Extremities: no edema   Chaperone:  pt declined      Procedure Note: Nexplanon insertion  Risks/benefits/side effects of Nexplanon have been discussed with patient and her questions have been answered. Patient is aware of the common side effect of irregular bleeding, which the incidence of decreases over time.   Results for orders placed or performed in visit on 11/06/21 (from the past 24 hour(s))  POCT urine pregnancy   Collection Time: 11/06/21  3:01 PM  Result Value Ref Range   Preg Test, Ur Negative Negative     She is right-handed, so her left arm, approximately 10 cm proximal from the elbow, was cleansed with alcohol and anesthetized with 2 mL of 1% Lidocaine with Epi.  The area was cleansed again with betadine and the Nexplanon was inserted per manufacturer's recommendations without difficulty.  A pressure bandage were applied.  Assessment & Plan:  1) Contraceptive management, Nexplanon insertion -contraceptive options reviewed -due to cHTN, ideally progesterone only option -pt desires Nexplanon, placed today, see above procedure note -f/u prn  2) Vaginal odor -vaginitis panel obtained,  further management pending results   Orders Placed This Encounter  Procedures   POCT urine pregnancy    Return for Feb 2024 annual.   Myna Hidalgo, DO Attending Obstetrician & Gynecologist, [facility] for [facility], [facility]

## 2021-11-10 ENCOUNTER — Other Ambulatory Visit: Payer: Self-pay | Admitting: Obstetrics & Gynecology

## 2021-11-10 DIAGNOSIS — B9689 Other specified bacterial agents as the cause of diseases classified elsewhere: Secondary | ICD-10-CM

## 2021-11-10 LAB — CERVICOVAGINAL ANCILLARY ONLY
Bacterial Vaginitis (gardnerella): POSITIVE — AB
Candida Glabrata: NEGATIVE
Candida Vaginitis: NEGATIVE
Chlamydia: NEGATIVE
Comment: NEGATIVE
Comment: NEGATIVE
Comment: NEGATIVE
Comment: NEGATIVE
Comment: NEGATIVE
Comment: NORMAL
Neisseria Gonorrhea: NEGATIVE
Trichomonas: NEGATIVE

## 2021-11-10 MED ORDER — METRONIDAZOLE 500 MG PO TABS: 500.0000 mg | ORAL_TABLET | Freq: Two times a day (BID) | ORAL | 0 refills | Status: AC

## 2021-11-10 NOTE — Progress Notes (Signed)
Rx sent in for BV

## 2021-11-12 ENCOUNTER — Other Ambulatory Visit: Payer: Self-pay | Admitting: Advanced Practice Midwife

## 2021-12-07 ENCOUNTER — Other Ambulatory Visit: Payer: Self-pay | Admitting: Women's Health

## 2021-12-08 ENCOUNTER — Telehealth: Payer: Self-pay | Admitting: Women's Health

## 2021-12-08 ENCOUNTER — Ambulatory Visit (INDEPENDENT_AMBULATORY_CARE_PROVIDER_SITE_OTHER): Payer: Medicaid Other | Admitting: *Deleted

## 2021-12-08 DIAGNOSIS — I10 Essential (primary) hypertension: Secondary | ICD-10-CM | POA: Diagnosis not present

## 2021-12-08 MED ORDER — TRIAMTERENE-HCTZ 37.5-25 MG PO TABS
1.0000 | ORAL_TABLET | Freq: Every day | ORAL | 11 refills | Status: DC
Start: 2021-12-08 — End: 2022-09-17

## 2021-12-08 NOTE — Progress Notes (Signed)
   NURSE VISIT- BLOOD PRESSURE CHECK  SUBJECTIVE:  Roberta Guerrero is a 26 y.o. G45P0101 female here for BP check. She is a GYN patient    HYPERTENSION ROS:   GYN patient: Taking medicines as instructed yes Headaches  Yes Chest pain No Shortness of breath Yes Swelling in legs/ankles Yes  OBJECTIVE:  BP (!) 143/88 (BP Location: Right Arm, Patient Position: Sitting, Cuff Size: Normal)   Pulse 66   Appearance alert, well appearing, and in no distress.  ASSESSMENT: GYN  blood pressure check  PLAN: Discussed with Joellyn Haff, CNM, [facility]   Recommendations: new prescription will be sent   Follow-up: in 2 days   Annamarie Dawley  12/08/2021 9:44 AM

## 2021-12-08 NOTE — Patient Instructions (Signed)
Primary Care Providers Dr. Zack Hall ([place]) [phone] [facility] [phone] [facility] ([place]) [phone] [facility] ([place]) [phone] [facility] () [phone] [facility] ([place]) [phone] [facility] [place] [phone] [facility] [phone]  

## 2021-12-08 NOTE — Addendum Note (Signed)
Addended by: Cheral Marker on: 12/08/2021 04:12 PM   Modules accepted: Orders

## 2021-12-08 NOTE — Telephone Encounter (Signed)
Pt states she wants to know wether to stop the 1st bp medication or take them both. Please advise.

## 2021-12-08 NOTE — Telephone Encounter (Signed)
Returned patient's call.  Advised to stop Nifedipine and to start taking Maxide.  Pt verbalized understanding with no further questions.

## 2022-01-06 ENCOUNTER — Ambulatory Visit: Payer: Medicaid Other | Admitting: Obstetrics & Gynecology

## 2022-01-16 ENCOUNTER — Ambulatory Visit: Payer: Medicaid Other | Admitting: Adult Health

## 2022-02-16 ENCOUNTER — Other Ambulatory Visit (INDEPENDENT_AMBULATORY_CARE_PROVIDER_SITE_OTHER): Payer: Medicaid Other

## 2022-02-16 ENCOUNTER — Other Ambulatory Visit (HOSPITAL_COMMUNITY)
Admission: RE | Admit: 2022-02-16 | Discharge: 2022-02-16 | Disposition: A | Discharge status: Home / Self Care | Payer: Medicaid Other | Source: Ambulatory Visit | Attending: Obstetrics & Gynecology | Admitting: Obstetrics & Gynecology

## 2022-02-16 DIAGNOSIS — N898 Other specified noninflammatory disorders of vagina: Secondary | ICD-10-CM | POA: Insufficient documentation

## 2022-02-16 NOTE — Progress Notes (Signed)
   NURSE VISIT- VAGINITIS/STD/POC  SUBJECTIVE:  Roberta Guerrero is a 26 y.o. G1P0101 GYN patientfemale here for a vaginal swab for vaginitis screening, STD screen.  She reports the following symptoms: discharge described as normal and physiologic and odor for 2 weeks. Denies abnormal vaginal bleeding, significant pelvic pain, fever, or UTI symptoms.  OBJECTIVE:  There were no vitals taken for this visit.  Appears well, in no apparent distress  ASSESSMENT: Vaginal swab for vaginitis screening and STD screening  PLAN: Self-collected vaginal probe for Gonorrhea, Chlamydia, Trichomonas, Bacterial Vaginosis, Yeast sent to lab Treatment: to be determined once results are received Follow-up as needed if symptoms persist/worsen, or new symptoms develop  Andrez Grime  02/16/2022 10:37 AM

## 2022-02-17 ENCOUNTER — Encounter: Payer: Self-pay | Admitting: Adult Health

## 2022-02-17 ENCOUNTER — Other Ambulatory Visit: Payer: Self-pay | Admitting: Adult Health

## 2022-02-17 DIAGNOSIS — N760 Acute vaginitis: Secondary | ICD-10-CM | POA: Insufficient documentation

## 2022-02-17 DIAGNOSIS — B9689 Other specified bacterial agents as the cause of diseases classified elsewhere: Secondary | ICD-10-CM | POA: Insufficient documentation

## 2022-02-17 LAB — CERVICOVAGINAL ANCILLARY ONLY
Bacterial Vaginitis (gardnerella): POSITIVE — AB
Candida Glabrata: NEGATIVE
Candida Vaginitis: NEGATIVE
Chlamydia: NEGATIVE
Comment: NEGATIVE
Comment: NEGATIVE
Comment: NEGATIVE
Comment: NEGATIVE
Comment: NEGATIVE
Comment: NORMAL
Neisseria Gonorrhea: NEGATIVE
Trichomonas: NEGATIVE

## 2022-02-17 MED ORDER — METRONIDAZOLE 500 MG PO TABS: 500.0000 mg | ORAL_TABLET | Freq: Two times a day (BID) | ORAL | 0 refills | Status: DC

## 2022-02-17 NOTE — Progress Notes (Signed)
+  BV on vaginal swab will rx flagyl,no sex or alcohol while taking  ?

## 2022-03-04 ENCOUNTER — Ambulatory Visit (HOSPITAL_COMMUNITY)
Admission: EM | Admit: 2022-03-04 | Discharge: 2022-03-04 | Disposition: A | Discharge status: Home / Self Care | Payer: Medicaid Other | Attending: Physician Assistant | Admitting: Physician Assistant

## 2022-03-04 ENCOUNTER — Encounter (HOSPITAL_COMMUNITY): Payer: Self-pay | Admitting: Physician Assistant

## 2022-03-04 DIAGNOSIS — J069 Acute upper respiratory infection, unspecified: Secondary | ICD-10-CM | POA: Insufficient documentation

## 2022-03-04 DIAGNOSIS — Z1152 Encounter for screening for COVID-19: Secondary | ICD-10-CM | POA: Diagnosis not present

## 2022-03-04 DIAGNOSIS — J029 Acute pharyngitis, unspecified: Secondary | ICD-10-CM | POA: Diagnosis not present

## 2022-03-04 LAB — POCT RAPID STREP A, ED / UC: Streptococcus, Group A Screen (Direct): NEGATIVE

## 2022-03-04 LAB — RESP PANEL BY RT-PCR (FLU A&B, COVID) ARPGX2
Influenza A by PCR: NEGATIVE
Influenza B by PCR: NEGATIVE
SARS Coronavirus 2 by RT PCR: NEGATIVE

## 2022-03-04 MED ORDER — FLUTICASONE PROPIONATE 50 MCG/ACT NA SUSP: 1.0000 | Freq: Every day | NASAL | 2 refills | Status: AC

## 2022-03-04 MED ORDER — PROMETHAZINE-DM 6.25-15 MG/5ML PO SYRP: 5.0000 mL | ORAL_SOLUTION | Freq: Four times a day (QID) | ORAL | 0 refills | Status: DC | PRN

## 2022-03-04 NOTE — Discharge Instructions (Addendum)
Your strep was negative.  We will contact you if your culture is positive and we need to start antibiotics.  Monitor your MyChart for your COVID and flu results.  Use Flonase for nasal congestion.  Use Promethazine DM for cough.  This make you sleepy do not drive or drink alcohol with taking it.  Use over-the-counter medications as needed for additional symptom relief.  Make sure you drink plenty of fluid.  If anything changes or worsens please return for reevaluation.

## 2022-03-04 NOTE — ED Triage Notes (Signed)
Pt is here for a sore throat, bilateral ear pain, headache , back pain, neck pain, body aches , chills and fever x2days

## 2022-03-04 NOTE — ED Provider Notes (Signed)
[facility]    CSN: [id number] Arrival date & time: 03/04/22  1028      History   Chief Complaint Chief Complaint  Patient presents with   Back Pain   Sore Throat   Fever   Otalgia   Cough   Headache   Shortness of Breath    HPI Roberta Guerrero is a 26 y.o. female.   Patient presents today with a 3-day history of URI symptoms including sore throat, headache, body aches, fever, chills.  Reports mild congestion and cough but these are minimal.  Denies any chest pain, shortness of breath, nausea, vomiting, diarrhea.  She has not tried any over-the-counter medication for symptom management.  She does have a history of allergies and has been taking her allergy medication as prescribed.  She also has a history of asthma but has not required use of her albuterol inhaler since symptoms began.  She has had COVID with last episode in August 2022.  She has not had COVID vaccines.  She has had her flu shot.  She denies any recent antibiotics or steroids.  Reports that her sore throat is her primary concern and is currently rated 7 on a 0-10 pain scale, described as sharp, worse with swallowing, no alleviating factors identified.  Denies any muffled voice, dysphagia, swelling of her throat, shortness of breath.    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 Authorizing Provider  fluticasone (FLONASE) 50 MCG/ACT nasal spray Place 1 spray into both nostrils daily. 03/04/22  Yes Haron Beilke K, PA-C  promethazine-dextromethorphan (PROMETHAZINE-DM) 6.25-15 MG/5ML syrup Take 5 mLs by mouth 4 (four) times daily as needed for cough. 03/04/22  Yes Kai Railsback K, PA-C  ibuprofen (ADVIL) 600 MG tablet Take 1 tablet (600 mg total) by mouth every 6 (six) hours. Patient not taking: Reported on 12/08/2021 07/12/21   Hermina Staggers, MD  metroNIDAZOLE (FLAGYL) 500 MG tablet Take 1 tablet (500 mg total) by mouth 2 (two) times daily. 02/17/22   Adline Potter, NP  Spacer/Aero-Holding Chambers (AEROCHAMBER PLUS) inhaler Use with inhaler 05/09/21   Domenick Gong, MD  triamterene-hydrochlorothiazide (MAXZIDE-25) 37.5-25 MG tablet Take 1 tablet by mouth daily. 12/08/21   Cheral Marker, CNM  VENTOLIN HFA 108 (90 Base) MCG/ACT inhaler INHALE 2 PUFFS INTO THE LUNGS EVERY 6 HOURS AS NEEDED FOR WHEEZING OR SHORTNESS OF BREATH 11/17/21   Cresenzo-Dishmon, Scarlette Calico, CNM    Family History Family History  Problem Relation Age of Onset   Hypertension Mother    Hypertension Father    Hyperlipidemia Father    Autism Brother    Autism Brother    Hypertension Maternal Uncle    Hypertension Maternal Grandmother    Alcohol abuse Maternal Grandfather     Social History Social History   Tobacco Use   Smoking status: Former    Types: Cigars   Smokeless tobacco: Never  Water quality scientist Use: Never used  Substance Use Topics   Alcohol use: Not Currently    Comment: occasional   Drug use: Not Currently    Types: Marijuana     Allergies   Other   Review of Systems Review of Systems  Constitutional:  Positive for activity change, fatigue and fever. Negative for appetite change.  HENT:  Positive for congestion and sore throat. Negative for sinus pressure and sneezing.   Respiratory:  Positive for cough. Negative for shortness of breath.   Cardiovascular:  Negative for chest pain.  Gastrointestinal:  Negative for abdominal pain, diarrhea, nausea and vomiting.  Musculoskeletal:  Positive for arthralgias and myalgias.  Neurological:  Negative for dizziness, light-headedness and headaches.     Physical Exam Triage Vital Signs ED Triage Vitals  Enc Vitals Group     BP 03/04/22 1045 (!) 127/96     Pulse Rate 03/04/22 1045 (!) 101     Resp 03/04/22 1045 18     Temp 03/04/22 1045 99.7 F (37.6 C)     Temp Source 03/04/22 1045 Oral     SpO2 03/04/22 1045 100 %     Weight --      Height --      Head Circumference --      Peak Flow --      Pain Score 03/04/22 1044 7     Pain Loc --      Pain Edu? --      Excl. in GC? --    No data found.  Updated Vital Signs BP (!) 127/96 (BP Location: Left Arm)   Pulse (!) 101   Temp 99.7 F (37.6 C) (Oral)   Resp 18   SpO2 100%   Visual Acuity Right Eye Distance:   Left Eye Distance:   Bilateral Distance:    Right Eye Near:   Left Eye Near:    Bilateral Near:     Physical Exam Vitals reviewed.  Constitutional:      General: She is awake. She is not in acute distress.    Appearance: Normal appearance. She is well-developed. She is not ill-appearing.     Comments: Very pleasant female appears stated age in no acute distress sitting comfortably in exam room  HENT:     Head: Normocephalic and atraumatic.     Right Ear: Tympanic membrane, ear canal and external ear normal. Tympanic membrane is not  erythematous or bulging.     Left Ear: Tympanic membrane, ear canal and external ear normal. Tympanic membrane is not erythematous or bulging.     Nose:  Right Sinus: No maxillary sinus tenderness or frontal sinus tenderness.     Left Sinus: No maxillary sinus tenderness or frontal sinus tenderness.     Mouth/Throat:     Pharynx: Uvula midline. Posterior oropharyngeal erythema present. No oropharyngeal exudate.     Tonsils: Tonsillar exudate present. No tonsillar abscesses. 2+ on the right. 2+ on the left.  Cardiovascular:     Rate and Rhythm: Normal rate and regular rhythm.     Heart sounds: Normal heart sounds, S1 normal and S2 normal. No murmur heard. Pulmonary:     Effort: Pulmonary effort is normal.     Breath sounds: Normal breath sounds. No wheezing, rhonchi or rales.     Comments: Clear to auscultation bilaterally Psychiatric:        Behavior: Behavior is cooperative.      UC Treatments / Results  Labs (all labs ordered are listed, but only abnormal results are displayed) Labs Reviewed  CULTURE, GROUP A STREP ([facility])  RESP PANEL BY RT-PCR (FLU A&B, COVID) ARPGX2  POCT RAPID STREP A, ED / UC    EKG   Radiology No results found.  Procedures Procedures (including critical care time)  Medications Ordered in UC Medications - No data to display  Initial Impression / Assessment and Plan / UC Course  I have reviewed the triage vital signs and the nursing notes.  Pertinent labs & imaging results that were available during my care of the patient were reviewed by me and considered in my medical decision making (see chart for details).     Strep testing was negative in clinic today.  Suspect viral etiology.  COVID/flu testing was obtained and is pending.  Patient will monitor MyChart for these results.  Will treat with symptomatic care including Promethazine DM.  Patient was instructed that this is sedating and she should not drive or drink alcohol while taking this.   She can use Flonase for nasal congestion.  Recommended over-the-counter medications as needed for additional symptoms.  She is to rest and drink plenty of fluid.  She is to return as needed with persistent or worsening symptoms.  Final Clinical Impressions(s) / UC Diagnoses   Final diagnoses:  Upper respiratory tract infection, unspecified type  Sore throat     Discharge Instructions      Your strep was negative.  We will contact you if your culture is positive and we need to start antibiotics.  Monitor your MyChart for your COVID and flu results.  Use Flonase for nasal congestion.  Use Promethazine DM for cough.  This make you sleepy do not drive or drink alcohol with taking it.  Use over-the-counter medications as needed for additional symptom relief.  Make sure you drink plenty of fluid.  If anything changes or worsens please return for reevaluation.     ED Prescriptions     Medication Sig Dispense Auth. Provider   promethazine-dextromethorphan (PROMETHAZINE-DM) 6.25-15 MG/5ML syrup Take 5 mLs by mouth 4 (four) times daily as needed for cough. 118 mL Kyran Connaughton K, PA-C   fluticasone (FLONASE) 50 MCG/ACT nasal spray Place 1 spray into both nostrils daily. 16 g Almeda Ezra K, PA-C      I have reviewed the PDMP during this encounter.   Jeani Hawking, PA-C 03/04/22 1144

## 2022-03-05 LAB — CULTURE, GROUP A STREP (THRC)

## 2022-06-02 ENCOUNTER — Encounter: Payer: Self-pay | Admitting: Family Medicine

## 2022-06-02 ENCOUNTER — Other Ambulatory Visit: Payer: Self-pay | Admitting: Family Medicine

## 2022-06-02 ENCOUNTER — Ambulatory Visit (INDEPENDENT_AMBULATORY_CARE_PROVIDER_SITE_OTHER): Payer: Medicaid Other | Admitting: Family Medicine

## 2022-06-02 VITALS — BP 124/83 | HR 96 | Ht 65.0 in | Wt 251.0 lb

## 2022-06-02 DIAGNOSIS — R7301 Impaired fasting glucose: Secondary | ICD-10-CM

## 2022-06-02 DIAGNOSIS — N898 Other specified noninflammatory disorders of vagina: Secondary | ICD-10-CM | POA: Diagnosis not present

## 2022-06-02 DIAGNOSIS — E785 Hyperlipidemia, unspecified: Secondary | ICD-10-CM | POA: Diagnosis not present

## 2022-06-02 DIAGNOSIS — E039 Hypothyroidism, unspecified: Secondary | ICD-10-CM | POA: Diagnosis not present

## 2022-06-02 DIAGNOSIS — K2970 Gastritis, unspecified, without bleeding: Secondary | ICD-10-CM

## 2022-06-02 LAB — POCT URINALYSIS DIPSTICK
Bilirubin, UA: NEGATIVE
Blood, UA: NEGATIVE
Glucose, UA: NEGATIVE
Ketones, UA: NEGATIVE
Leukocytes, UA: NEGATIVE
Nitrite, UA: NEGATIVE
Protein, UA: NEGATIVE
Spec Grav, UA: >=1.03 — AB (ref 1.010–1.025)
Urobilinogen, UA: 1 E.U./dL
pH, UA: 6 (ref 5.0–8.0)

## 2022-06-02 LAB — POCT URINE PREGNANCY: Preg Test, Ur: NEGATIVE

## 2022-06-02 MED ORDER — PANTOPRAZOLE SODIUM 20 MG PO TBEC: 20.0000 mg | DELAYED_RELEASE_TABLET | Freq: Once | ORAL | 1 refills | Status: DC | PRN

## 2022-06-02 NOTE — Patient Instructions (Signed)
It was pleasure meeting with you today. Please take medications as prescribed. Follow up with your primary health provider if any health concerns arises. Follow up in 1 week for your annual physical

## 2022-06-02 NOTE — Progress Notes (Signed)
New Patient Office Visit   Subjective   Patient ID: Roberta Guerrero, female    DOB: [birth year]  Age: 27 y.o. MRN: [id number]  CC:  Chief Complaint  Patient presents with   Establish Care   Fatigue    Patient complains of fatigue daily.    Abdominal Pain    Patient states every day she has upset stomach and needs to have a bowel movement.   Vaginal Discharge    Patient complains of abnormal discharge for 2 weeks.     HPI Roberta Guerrero 27 year old female,presents to establish care. She  has a past medical history of Allergic rhinitis, Asthma, Headache, History of chlamydia, Hypertension, and Menstrual migraine.  Patient complains of foul smelling urine and clear vaginal discharge She has had symptoms for 2 weeks. Patient also complains of back pain. Patient denies fever and headache. Patient does not have a history of recurrent UTI.  Patient does not have a history of pyelonephritis.    Abdominal Pain This is a chronic problem. The current episode started more than 1 month ago. The problem occurs intermittently. The problem has been unchanged. The pain is located in the generalized abdominal region. The pain is at a severity of 6/10. The pain is moderate. The quality of the pain is cramping. The abdominal pain does not radiate. Associated symptoms include nausea. Pertinent negatives include no constipation, diarrhea, dysuria, fever, frequency, hematochezia, hematuria or melena. The pain is aggravated by eating and movement. The pain is relieved by Bowel movements. She has tried nothing for the symptoms.  Vaginal Discharge The patient's primary symptoms include vaginal discharge. This is a recurrent problem. The current episode started 1 to 4 weeks ago. The problem occurs daily. The problem has been gradually worsening. The patient is experiencing no pain. Associated symptoms include abdominal pain and nausea. Pertinent negatives include no constipation, diarrhea, dysuria, fever, flank  pain, frequency or hematuria. The vaginal discharge was clear, watery and scant. There has been no bleeding. She has not been passing clots. She has not been passing tissue. The symptoms are aggravated by intercourse. She has tried nothing for the symptoms. She is sexually active. It is unknown whether or not her partner has an STD. Contraceptive use: Nexplanon. Her past medical history is significant for an STD.      Outpatient Encounter Medications as of 06/02/2022  Medication Sig   fluticasone (FLONASE) 50 MCG/ACT nasal spray Place 1 spray into both nostrils daily.   pantoprazole (PROTONIX) 20 MG tablet Take 1 tablet (20 mg total) by mouth once as needed for up to 1 dose for indigestion or heartburn.   Spacer/Aero-Holding Chambers (AEROCHAMBER PLUS) inhaler Use with inhaler   triamterene-hydrochlorothiazide (MAXZIDE-25) 37.5-25 MG tablet Take 1 tablet by mouth daily.   VENTOLIN HFA 108 (90 Base) MCG/ACT inhaler INHALE 2 PUFFS INTO THE LUNGS EVERY 6 HOURS AS NEEDED FOR WHEEZING OR SHORTNESS OF BREATH   ibuprofen (ADVIL) 600 MG tablet Take 1 tablet (600 mg total) by mouth every 6 (six) hours. (Patient not taking: Reported on 12/08/2021)   metroNIDAZOLE (FLAGYL) 500 MG tablet Take 1 tablet (500 mg total) by mouth 2 (two) times daily.   promethazine-dextromethorphan (PROMETHAZINE-DM) 6.25-15 MG/5ML syrup Take 5 mLs by mouth 4 (four) times daily as needed for cough.   No facility-administered encounter medications on file as of 06/02/2022.    Past Surgical History:  Procedure Laterality Date   CHOLECYSTECTOMY     MOUTH SURGERY  SKIN BIOPSY      Review of Systems  Constitutional:  Negative for fever.  Gastrointestinal:  Positive for abdominal pain and nausea. Negative for constipation, diarrhea, hematochezia and melena.  Genitourinary:  Positive for vaginal discharge. Negative for dysuria, flank pain, frequency and hematuria.      Objective    BP 124/83   Pulse 96   Ht 5' 5"$  (1.651  m)   Wt 251 lb (113.9 kg)   SpO2 100%   BMI 41.77 kg/m   Physical Exam Vitals reviewed.  Constitutional:      Appearance: She is obese.  Cardiovascular:     Rate and Rhythm: Normal rate and regular rhythm.     Heart sounds: No murmur heard. Pulmonary:     Effort: Pulmonary effort is normal.     Breath sounds: Normal breath sounds.  Abdominal:     General: Abdomen is flat. Bowel sounds are normal. There is no distension.     Palpations: Abdomen is soft. There is no fluid wave or mass.     Tenderness: There is no abdominal tenderness. There is no right CVA tenderness, left CVA tenderness, guarding or rebound. Negative signs include psoas sign.  Skin:    General: Skin is warm and dry.     Capillary Refill: Capillary refill takes less than 2 seconds.  Neurological:     General: No focal deficit present.     Mental Status: She is alert and oriented to person, place, and time.  Psychiatric:        Mood and Affect: Mood normal.       Assessment & Plan:  Vaginal discharge Assessment & Plan: Labs ordered NuSwab, pregnancy test, urinalysis Will follow up with results.  Orders: -     POCT urinalysis dipstick -     POCT urine pregnancy -     NuSwab Vaginitis Plus (VG+)  Hypothyroidism, unspecified type -     TSH + free T4  Hyperlipidemia, unspecified hyperlipidemia type -     Lipid panel -     CMP14+EGFR -     CBC with Differential/Platelet  IFG (impaired fasting glucose) -     Hemoglobin A1c  Gastritis without bleeding, unspecified chronicity, unspecified gastritis type Assessment & Plan: Labs ordered GI profile stool  Prescribed Protonix 20 mg Advise patient  to start lifestyle modifications eliminating irritating food such as lactose from dairy or gluten rich foods,  follow diet low in saturated fat, reduce dietary salt intake, avoid fatty foods, avoid NSAIDS Follow up in 4 weeks  Orders: -     GI Profile, Stool, PCR -     Fecal occult blood,  imunochemical  Other orders -     Pantoprazole Sodium; Take 1 tablet (20 mg total) by mouth once as needed for up to 1 dose for indigestion or heartburn.  Dispense: 30 tablet; Refill: 1    Return in about 1 week (around 06/09/2022) for Annual Physical.   Renard Hamper Ria Comment, FNP

## 2022-06-02 NOTE — Assessment & Plan Note (Addendum)
Labs ordered GI profile stool  Prescribed Protonix 20 mg Advise patient  to start lifestyle modifications eliminating irritating food such as lactose from dairy or gluten rich foods,  follow diet low in saturated fat, reduce dietary salt intake, avoid fatty foods, avoid NSAIDS Follow up in 4 weeks

## 2022-06-02 NOTE — Assessment & Plan Note (Signed)
Labs ordered NuSwab, pregnancy test, urinalysis Will follow up with results.

## 2022-06-03 LAB — CBC WITH DIFFERENTIAL/PLATELET
Basophils Absolute: 0.1 10*3/uL (ref 0.0–0.2)
Basos: 1 %
EOS (ABSOLUTE): 0.2 10*3/uL (ref 0.0–0.4)
Eos: 3 %
Hematocrit: 38 % (ref 34.0–46.6)
Hemoglobin: 12.6 g/dL (ref 11.1–15.9)
Immature Grans (Abs): 0 10*3/uL (ref 0.0–0.1)
Immature Granulocytes: 0 %
Lymphocytes Absolute: 1.7 10*3/uL (ref 0.7–3.1)
Lymphs: 35 %
MCH: 27.6 pg (ref 26.6–33.0)
MCHC: 33.2 g/dL (ref 31.5–35.7)
MCV: 83 fL (ref 79–97)
Monocytes Absolute: 0.5 10*3/uL (ref 0.1–0.9)
Monocytes: 11 %
Neutrophils Absolute: 2.4 10*3/uL (ref 1.4–7.0)
Neutrophils: 50 %
Platelets: 271 10*3/uL (ref 150–450)
RBC: 4.57 x10E6/uL (ref 3.77–5.28)
RDW: 12.7 % (ref 11.7–15.4)
WBC: 4.8 10*3/uL (ref 3.4–10.8)

## 2022-06-03 LAB — LIPID PANEL
Chol/HDL Ratio: 3.7 ratio (ref 0.0–4.4)
Cholesterol, Total: 162 mg/dL (ref 100–199)
HDL: 44 mg/dL (ref >39)
LDL Chol Calc (NIH): 110 mg/dL — ABNORMAL HIGH (ref 0–99)
Triglycerides: 37 mg/dL (ref 0–149)
VLDL Cholesterol Cal: 8 mg/dL (ref 5–40)

## 2022-06-03 LAB — CMP14+EGFR
ALT: 13 IU/L (ref 0–32)
AST: 17 IU/L (ref 0–40)
Albumin/Globulin Ratio: 1.5 (ref 1.2–2.2)
Albumin: 3.7 g/dL — ABNORMAL LOW (ref 4.0–5.0)
Alkaline Phosphatase: 58 IU/L (ref 44–121)
BUN/Creatinine Ratio: 11 (ref 9–23)
BUN: 9 mg/dL (ref 6–20)
Bilirubin Total: 0.3 mg/dL (ref 0.0–1.2)
CO2: 21 mmol/L (ref 20–29)
Calcium: 8.5 mg/dL — ABNORMAL LOW (ref 8.7–10.2)
Chloride: 104 mmol/L (ref 96–106)
Creatinine, Ser: 0.82 mg/dL (ref 0.57–1.00)
Globulin, Total: 2.5 g/dL (ref 1.5–4.5)
Glucose: 94 mg/dL (ref 70–99)
Potassium: 3.9 mmol/L (ref 3.5–5.2)
Sodium: 138 mmol/L (ref 134–144)
Total Protein: 6.2 g/dL (ref 6.0–8.5)
eGFR: 101 mL/min/{1.73_m2} (ref >59)

## 2022-06-03 LAB — TSH+FREE T4
Free T4: 1.34 ng/dL (ref 0.82–1.77)
TSH: 0.867 u[IU]/mL (ref 0.450–4.500)

## 2022-06-03 LAB — HEMOGLOBIN A1C
Est. average glucose Bld gHb Est-mCnc: 100 mg/dL
Hgb A1c MFr Bld: 5.1 % (ref 4.8–5.6)

## 2022-06-05 ENCOUNTER — Other Ambulatory Visit: Payer: Self-pay | Admitting: Family Medicine

## 2022-06-05 MED ORDER — TERCONAZOLE 0.8 % VA CREA: 1.0000 | TOPICAL_CREAM | Freq: Every day | VAGINAL | 1 refills | Status: AC

## 2022-06-06 LAB — NUSWAB VAGINITIS PLUS (VG+)
Candida albicans, NAA: POSITIVE — AB
Candida glabrata, NAA: NEGATIVE
Chlamydia trachomatis, NAA: NEGATIVE
Neisseria gonorrhoeae, NAA: NEGATIVE
Trich vag by NAA: NEGATIVE

## 2022-06-11 ENCOUNTER — Ambulatory Visit: Payer: Medicaid Other | Admitting: Obstetrics and Gynecology

## 2022-06-15 ENCOUNTER — Ambulatory Visit (INDEPENDENT_AMBULATORY_CARE_PROVIDER_SITE_OTHER): Payer: Medicaid Other | Admitting: Family Medicine

## 2022-06-15 ENCOUNTER — Other Ambulatory Visit: Payer: Self-pay | Admitting: Family Medicine

## 2022-06-15 ENCOUNTER — Encounter: Payer: Self-pay | Admitting: Family Medicine

## 2022-06-15 VITALS — BP 136/82 | HR 64 | Ht 65.0 in | Wt 251.0 lb

## 2022-06-15 DIAGNOSIS — J4520 Mild intermittent asthma, uncomplicated: Secondary | ICD-10-CM

## 2022-06-15 DIAGNOSIS — K2970 Gastritis, unspecified, without bleeding: Secondary | ICD-10-CM

## 2022-06-15 MED ORDER — ALBUTEROL SULFATE HFA 108 (90 BASE) MCG/ACT IN AERS: 2.0000 | INHALATION_SPRAY | Freq: Four times a day (QID) | RESPIRATORY_TRACT | 2 refills | Status: DC | PRN

## 2022-06-15 MED ORDER — FAMOTIDINE 20 MG PO TABS: 20.0000 mg | ORAL_TABLET | Freq: Every evening | ORAL | 1 refills | Status: AC

## 2022-06-15 MED ORDER — BUDESONIDE-FORMOTEROL FUMARATE 80-4.5 MCG/ACT IN AERO: 2.0000 | INHALATION_SPRAY | Freq: Two times a day (BID) | RESPIRATORY_TRACT | 12 refills | Status: AC

## 2022-06-15 NOTE — Progress Notes (Deleted)
Complete physical exam  Patient: Roberta Guerrero   DOB: [birth year]   27 y.o. Female  MRN: [id number]  Subjective:    Chief Complaint  Patient presents with   Annual Exam    Roberta Guerrero is a 27 y.o. female who presents today for a complete physical exam. She reports consuming a {diet types:17450} diet. {types:19826} She generally feels {DESC; WELL/FAIRLY WELL/POORLY:18703}. She reports sleeping {DESC; WELL/FAIRLY WELL/POORLY:18703}. She {does/does not:200015} have additional problems to discuss today.    Most recent fall risk assessment:    06/15/2022    2:53 PM  [place] in the past year? 0  Number falls in past yr: 0  Injury with Fall? 0  Risk for fall due to : No Fall Risks  Follow up Falls evaluation completed     Most recent depression screenings:    06/15/2022    2:53 PM 06/02/2022    8:15 AM  PHQ 2/9 Scores  PHQ - 2 Score 2 0  PHQ- 9 Score 7 5    {VISON DENTAL STD PSA (Optional):27386}  Patient Care Team: Del Ria Comment, Lamar Benes, FNP as PCP - General (Family Medicine)   Outpatient Medications Prior to Visit  Medication Sig   fluticasone (FLONASE) 50 MCG/ACT nasal spray Place 1 spray into both nostrils daily.   ibuprofen (ADVIL) 600 MG tablet Take 1 tablet (600 mg total) by mouth every 6 (six) hours.   metroNIDAZOLE (FLAGYL) 500 MG tablet Take 1 tablet (500 mg total) by mouth 2 (two) times daily.   pantoprazole (PROTONIX) 20 MG tablet Take 1 tablet (20 mg total) by mouth once as needed for up to 1 dose for indigestion or heartburn.   promethazine-dextromethorphan (PROMETHAZINE-DM) 6.25-15 MG/5ML syrup Take 5 mLs by mouth 4 (four) times daily as needed for cough.   Spacer/Aero-Holding Chambers (AEROCHAMBER PLUS) inhaler Use with inhaler   triamterene-hydrochlorothiazide (MAXZIDE-25) 37.5-25 MG tablet Take 1 tablet by mouth daily.   VENTOLIN HFA 108 (90 Base) MCG/ACT inhaler INHALE 2 PUFFS INTO THE LUNGS EVERY 6 HOURS AS NEEDED FOR WHEEZING OR SHORTNESS  OF BREATH   No facility-administered medications prior to visit.    ROS     Objective:    BP 136/82   Pulse 64   Ht '5\' 5"'$  (1.651 m)   Wt 251 lb (113.9 kg)   SpO2 92%   BMI 41.77 kg/m  {Vitals History (Optional):23777}  Physical Exam   No results found for any visits on 06/15/22.    Assessment & Plan:    Routine Health Maintenance and Physical Exam  Immunization History  Administered Date(s) Administered   HPV Quadrivalent 01/19/2011   Hepatitis A 12/07/2008, 01/19/2011   Influenza,inj,Quad PF,6+ Mos 02/23/2013   Influenza-Unspecified 03/18/2006, 02/08/2007, 01/22/2010, 01/19/2011   Meningococcal Conjugate 11/02/2007   Tdap 11/02/2007, 05/20/2021    Health Maintenance  Topic Date Due   HPV VACCINES (2 - 2-dose series) 07/20/2011   COVID-19 Vaccine (1) 06/18/2022 (Originally 08/10/1996)   INFLUENZA VACCINE  07/12/2022 (Originally 11/11/2021)   PAP-Cervical Cytology Screening  06/07/2023   PAP SMEAR-Modifier  06/07/2023   DTaP/Tdap/Td (3 - Td or Tdap) 05/21/2031   Hepatitis C Screening  Completed   HIV Screening  Completed    Discussed health benefits of physical activity, and encouraged her to engage in regular exercise appropriate for her age and condition.  There are no diagnoses linked to this encounter.  No follow-ups on file.     Renard Hamper Ria Comment, FNP

## 2022-06-15 NOTE — Progress Notes (Signed)
Patient Office Visit   Subjective   Patient ID: [name], female    DOB: 1995-08-02  Age: 27 y.o. MRN: [id number]  CC:  Chief Complaint  Patient presents with   Annual Exam    HPI Roberta Guerrero 27 year old female, present for asthma treatment She  has a past medical history of Allergic rhinitis, Asthma, Headache, History of chlamydia, Hypertension, and Menstrual migraine.  Patient's symptoms include chest tightness, dyspnea, non-productive cough, and wheezing. Associated symptoms include dyspnea, nasal congestion, rhinorrhea , sneezing, and wheezing. The patient has been suffering from these symptoms for approximately several years. Patient reports got worse afther childbirth. Symptoms have been gradually worsening since their onset. Medications used in the past to treat these symptoms include beta agonist inhalers. Suspected precipitants include cold air, dust, exercise, occupational exposure, pollens, smoke, and strong odors. Patient is awoken from sleep approximately none times per night. Patient has required Emergency Room treatment for these symptoms, and has not required hospitalization. The patient has not been intubated in the past.     Outpatient Encounter Medications as of 06/15/2022  Medication Sig   albuterol (VENTOLIN HFA) 108 (90 Base) MCG/ACT inhaler Inhale 2 puffs into the lungs every 6 (six) hours as needed for wheezing or shortness of breath.   budesonide-formoterol (SYMBICORT) 80-4.5 MCG/ACT inhaler Inhale 2 puffs into the lungs 2 (two) times daily.   famotidine (PEPCID) 20 MG tablet Take 1 tablet (20 mg total) by mouth at bedtime.   fluticasone (FLONASE) 50 MCG/ACT nasal spray Place 1 spray into both nostrils daily.   ibuprofen (ADVIL) 600 MG tablet Take 1 tablet (600 mg total) by mouth every 6 (six) hours.   metroNIDAZOLE (FLAGYL) 500 MG tablet Take 1 tablet (500 mg total) by mouth 2 (two) times daily.   pantoprazole (PROTONIX) 20 MG tablet Take 1 tablet (20 mg  total) by mouth once as needed for up to 1 dose for indigestion or heartburn.   promethazine-dextromethorphan (PROMETHAZINE-DM) 6.25-15 MG/5ML syrup Take 5 mLs by mouth 4 (four) times daily as needed for cough.   Spacer/Aero-Holding Chambers (AEROCHAMBER PLUS) inhaler Use with inhaler   triamterene-hydrochlorothiazide (MAXZIDE-25) 37.5-25 MG tablet Take 1 tablet by mouth daily.   [DISCONTINUED] VENTOLIN HFA 108 (90 Base) MCG/ACT inhaler INHALE 2 PUFFS INTO THE LUNGS EVERY 6 HOURS AS NEEDED FOR WHEEZING OR SHORTNESS OF BREATH   No facility-administered encounter medications on file as of 06/15/2022.    Past Surgical History:  Procedure Laterality Date   CHOLECYSTECTOMY     MOUTH SURGERY     SKIN BIOPSY      Review of Systems  Constitutional:  Negative for chills and fever.  Respiratory:  Positive for cough, shortness of breath and wheezing. Negative for hemoptysis and sputum production.       Objective    BP 136/82   Pulse 64   Ht '5\' 5"'$  (1.651 m)   Wt 251 lb (113.9 kg)   SpO2 92%   BMI 41.77 kg/m   Physical Exam Constitutional:      Appearance: Normal appearance.  Cardiovascular:     Rate and Rhythm: Normal rate.     Pulses: Normal pulses.  Pulmonary:     Effort: Pulmonary effort is normal. No respiratory distress.     Breath sounds: Normal breath sounds. No wheezing or rhonchi.  Chest:     Chest wall: No tenderness.  Musculoskeletal:        General: Normal range of motion.  Skin:  General: Skin is warm and dry.     Capillary Refill: Capillary refill takes less than 2 seconds.  Neurological:     General: No focal deficit present.     Mental Status: She is alert.  Psychiatric:        Mood and Affect: Mood normal.        Thought Content: Thought content normal.       Assessment & Plan:  Mild intermittent asthma without complication Assessment & Plan: Prescribed Symbicort inhaler to be taken in morning and evening. Encouraged non pharmacological interventions  such as avoiding allergens, having SABA inhaler handy at all times. Asthma action plan reviewed and updated. Discussed signs and symptoms of respiratory distress and need to present to the ED. Follow up in 3 months or sooner if needed.Patient/parent verbalizes understanding regarding plan of care and all questions answered.   Orders: -     Budesonide-Formoterol Fumarate; Inhale 2 puffs into the lungs 2 (two) times daily.  Dispense: 1 each; Refill: 12 -     Albuterol Sulfate HFA; Inhale 2 puffs into the lungs every 6 (six) hours as needed for wheezing or shortness of breath.  Dispense: 8 g; Refill: 2  Other orders -     Famotidine; Take 1 tablet (20 mg total) by mouth at bedtime.  Dispense: 30 tablet; Refill: 1    Return in about 4 weeks (around 07/13/2022) for re-check blood pressure, hypertension.   Renard Hamper Ria Comment, FNP

## 2022-06-15 NOTE — Assessment & Plan Note (Addendum)
Prescribed Symbicort inhaler to be taken in morning and evening. Encouraged non pharmacological interventions such as avoiding allergens, having SABA inhaler handy at all times. Asthma action plan reviewed and updated. Discussed signs and symptoms of respiratory distress and need to present to the ED. Follow up in 3 months or sooner if needed.Patient/parent verbalizes understanding regarding plan of care and all questions answered.

## 2022-06-15 NOTE — Patient Instructions (Signed)
It was pleasure meeting with you today. Please take medications as prescribed. Follow up with your primary health provider if any health concerns arises. If symptoms worsen please contact your primary care provider and/or visit the emergency department.

## 2022-06-16 ENCOUNTER — Encounter: Payer: Self-pay | Admitting: Family Medicine

## 2022-06-16 ENCOUNTER — Other Ambulatory Visit: Payer: Self-pay | Admitting: Family Medicine

## 2022-06-16 MED ORDER — EPINEPHRINE 0.3 MG/0.3ML IJ SOAJ: 0.3000 mg | INTRAMUSCULAR | 3 refills | Status: AC | PRN

## 2022-07-02 ENCOUNTER — Other Ambulatory Visit: Payer: Self-pay | Admitting: Family Medicine

## 2022-07-13 ENCOUNTER — Encounter: Payer: Self-pay | Admitting: Family Medicine

## 2022-07-13 ENCOUNTER — Ambulatory Visit: Payer: Medicaid Other | Admitting: Family Medicine

## 2022-09-11 ENCOUNTER — Other Ambulatory Visit: Payer: Self-pay | Admitting: *Deleted

## 2022-09-17 ENCOUNTER — Telehealth: Payer: Self-pay | Admitting: Family Medicine

## 2022-09-17 ENCOUNTER — Other Ambulatory Visit: Payer: Self-pay | Admitting: Family Medicine

## 2022-09-17 MED ORDER — TRIAMTERENE-HCTZ 37.5-25 MG PO TABS: 1.0000 | ORAL_TABLET | Freq: Every day | ORAL | 3 refills | Status: DC

## 2022-09-17 NOTE — Telephone Encounter (Signed)
Patient called need med refill, spoke to [place] and would like for patient to continue this medication no longer gets from her OBGYN provider.  Prescription Request  09/17/2022  LOV: 06/15/2022  What is the name of the medication or equipment? triamterene-hydrochlorothiazide (MAXZIDE-25) 37.5-25 MG tablet [[id number]]   Have you contacted your pharmacy to request a refill? No   Which pharmacy would you like this sent to?  [id number] - [place], [place] - [address] [place] [address] Phone: [phone] Fax: [phone]    Patient notified that their request is being sent to the clinical staff for review and that they should receive a response within 2 business days.   Please advise at Mobile [phone] (mobile)

## 2023-02-18 ENCOUNTER — Other Ambulatory Visit: Payer: Self-pay | Admitting: Family Medicine

## 2023-03-04 ENCOUNTER — Ambulatory Visit (HOSPITAL_COMMUNITY)
Admission: EM | Admit: 2023-03-04 | Discharge: 2023-03-04 | Disposition: A | Discharge status: Home / Self Care | Payer: 59 | Attending: Internal Medicine | Admitting: Internal Medicine

## 2023-03-04 ENCOUNTER — Encounter (HOSPITAL_COMMUNITY): Payer: Self-pay | Admitting: Emergency Medicine

## 2023-03-04 ENCOUNTER — Ambulatory Visit (HOSPITAL_COMMUNITY): Payer: 59

## 2023-03-04 DIAGNOSIS — R059 Cough, unspecified: Secondary | ICD-10-CM | POA: Diagnosis not present

## 2023-03-04 DIAGNOSIS — R053 Chronic cough: Secondary | ICD-10-CM | POA: Diagnosis not present

## 2023-03-04 DIAGNOSIS — J069 Acute upper respiratory infection, unspecified: Secondary | ICD-10-CM | POA: Diagnosis not present

## 2023-03-04 DIAGNOSIS — J029 Acute pharyngitis, unspecified: Secondary | ICD-10-CM

## 2023-03-04 DIAGNOSIS — R0602 Shortness of breath: Secondary | ICD-10-CM | POA: Diagnosis not present

## 2023-03-04 LAB — POCT RAPID STREP A (OFFICE): Rapid Strep A Screen: NEGATIVE

## 2023-03-04 MED ORDER — PREDNISONE 20 MG PO TABS: 40.0000 mg | ORAL_TABLET | Freq: Every day | ORAL | 0 refills | Status: AC

## 2023-03-04 MED ORDER — ALBUTEROL SULFATE HFA 108 (90 BASE) MCG/ACT IN AERS: 1.0000 | INHALATION_SPRAY | Freq: Four times a day (QID) | RESPIRATORY_TRACT | 0 refills | Status: DC | PRN

## 2023-03-04 MED ORDER — AMOXICILLIN-POT CLAVULANATE 875-125 MG PO TABS: 1.0000 | ORAL_TABLET | Freq: Two times a day (BID) | ORAL | 0 refills | Status: DC

## 2023-03-04 MED ORDER — BENZONATATE 100 MG PO CAPS
100.0000 mg | ORAL_CAPSULE | Freq: Three times a day (TID) | ORAL | 0 refills | Status: AC | PRN
Start: 2023-03-04 — End: ?

## 2023-03-04 NOTE — ED Provider Notes (Signed)
[facility]    CSN: [id number] Arrival date & time: 03/04/23  1015      History   Chief Complaint Chief Complaint  Patient presents with   Cough   Sore Throat   Otalgia    HPI Roberta Guerrero is a 27 y.o. female.   Patient presents with cough, sore throat, shortness of breath, ear pain, nasal congestion, fever that all started almost 2 weeks ago.  Reports that she last saw a fever about 6 days ago and Tmax was 103 at home.  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  She has a history of asthma but has not had access to her inhalers given insurance complications.  She has taken several different over-the-counter cold and flu medications with minimal improvement in symptoms.  Cough is productive.   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/2021   Asthma 03/14/2018   Chronic hypertension 09/03/2017   Obesity, morbid (HCC) 06/14/2013   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 Authorizing Provider  albuterol (VENTOLIN HFA) 108 (90 Base) MCG/ACT inhaler Inhale 1-2 puffs into the lungs every 6 (six) hours as needed for wheezing or shortness of breath. 03/04/23  Yes Elaysia Devargas, Rolly [place]ter E, FNP  amoxicillin-clavulanate (AUGMENTIN) 875-125 MG tablet Take 1 tablet by mouth every 12 (twelve) hours. 03/04/23  Yes Johnnye Sandford, Rolly Salter E, FNP  benzonatate (TESSALON) 100 MG capsule Take 1 capsule (100 mg total) by mouth every 8 (eight) hours as needed for cough. 03/04/23  Yes Ryann Leavitt, Rolly Salter E, FNP  EPINEPHrine 0.3 mg/0.3 mL IJ SOAJ injection Inject 0.3 mg into the muscle as needed for anaphylaxis. 06/16/22   Del Nigel Berthold, FNP  predniSONE (DELTASONE) 20 MG tablet Take 2 tablets (40 mg total) by mouth daily for 5 days. 03/04/23 03/09/23 Yes Jacque Byron, Acie Fredrickson, FNP  albuterol (VENTOLIN HFA) 108 (90 Base) MCG/ACT inhaler Inhale 2 puffs into the lungs every 6 (six) hours as needed for wheezing or shortness of breath. Patient not taking: Reported on 03/04/2023 06/15/22  Del Newman Nip, [place], FNP  budesonide-formoterol (SYMBICORT) 80-4.5 MCG/ACT inhaler Inhale 2 puffs into the lungs 2 (two) times daily. Patient not taking: Reported on 03/04/2023 06/15/22   Del Nigel Berthold, FNP  famotidine (PEPCID) 20 MG tablet Take 1 tablet (20 mg total) by mouth at bedtime. Patient not taking: Reported on 03/04/2023 06/15/22   Del Nigel Berthold, FNP  fluticasone (FLONASE) 50 MCG/ACT nasal spray Place 1 spray into both nostrils daily. Patient not taking: Reported on 03/04/2023 03/04/22   Raspet, Noberto Retort, PA-C  ibuprofen (ADVIL) 600 MG tablet Take 1 tablet (600 mg total) by mouth every 6 (six) hours. Patient not taking: Reported on 03/04/2023 07/12/21   Hermina Staggers, MD  pantoprazole (PROTONIX) 20 MG tablet TAKE 1 TABLET(20 MG) BY MOUTH DAILY FOR UP TO 1 DOSE AS NEEDED FOR INDIGESTION  OR HEARTBURN Patient not taking: Reported on 03/04/2023 07/03/22   Del Nigel Berthold, FNP  promethazine-dextromethorphan (PROMETHAZINE-DM) 6.25-15 MG/5ML syrup Take 5 mLs by mouth 4 (four) times daily as needed for cough. 03/04/22   Raspet, Noberto Retort, PA-C  Spacer/Aero-Holding Chambers (AEROCHAMBER PLUS) inhaler Use with inhaler Patient not taking: Reported on 03/04/2023 05/09/21   Domenick Gong, MD  triamterene-hydrochlorothiazide (MAXZIDE-25) 37.5-25 MG tablet TAKE 1 TABLET BY MOUTH DAILY 02/18/23   Del Nigel Berthold, FNP    Family History Family History  Problem Relation Age of Onset   Hypertension Mother    Hypertension Father    Hyperlipidemia Father    Autism Brother    Autism Brother    Hypertension Maternal Uncle    Hypertension Maternal Grandmother    Alcohol abuse Maternal Grandfather     Social History Social History   Tobacco Use   Smoking status: Former    Types: Cigars   Smokeless tobacco: Never  Vaping Use   Vaping status: Never Used  Substance Use Topics   Alcohol use: Not Currently    Comment: occasional   Drug use: Not Currently    Types: Marijuana     Allergies   Other   Review of Systems Review of Systems Per HPI  Physical Exam Triage Vital Signs ED Triage Vitals  Encounter Vitals Group     BP 03/04/23 1126 132/88     Systolic BP Percentile --      Diastolic BP Percentile --      Pulse Rate 03/04/23 1126 83     Resp 03/04/23 1126 19     Temp 03/04/23 1126 98 F (36.7 C)     Temp Source 03/04/23 1126 Oral     SpO2 03/04/23 1126 98 %     Weight --      Height --      Head Circumference --      Peak Flow --      Pain Score 03/04/23 1124 6     Pain Loc --      Pain Education --      Exclude from Growth Chart --    No data found.  Updated Vital Signs BP 132/88 (BP Location: Left Arm)   Pulse 83   Temp 98 F (36.7 C) (Oral)   Resp 19   LMP 02/04/2023 (Approximate)   SpO2 98%   Visual Acuity Right Eye Distance:    Left Eye Distance:   Bilateral Distance:    Right Eye Near:   Left Eye Near:    Bilateral Near:     Physical Exam Constitutional:  General: She is not in acute distress.    Appearance: Normal appearance. She is not toxic-appearing or diaphoretic.  HENT:     Head: Normocephalic and atraumatic.     Right Ear: Tympanic membrane and ear canal normal.     Left Ear: Tympanic membrane and ear canal normal.     Nose: Congestion present.     Mouth/Throat:     Mouth: Mucous membranes are moist.     Pharynx: Posterior oropharyngeal erythema present.  Eyes:     Extraocular Movements: Extraocular movements intact.     Conjunctiva/sclera: Conjunctivae normal.     Pupils: Pupils are equal, round, and reactive to light.  Cardiovascular:     Rate and Rhythm: Normal rate and regular rhythm.     Pulses: Normal pulses.     Heart sounds: Normal heart sounds.  Pulmonary:     Effort: Pulmonary effort is normal. No respiratory distress.     Breath sounds: Normal breath sounds. No stridor. No wheezing, rhonchi or rales.  Abdominal:     General: Abdomen is flat. Bowel sounds are normal.     Palpations: Abdomen is soft.  Musculoskeletal:        General: Normal range of motion.     Cervical back: Normal range of motion.  Skin:    General: Skin is warm and dry.  Neurological:     General: No focal deficit present.     Mental Status: She is alert and oriented to person, place, and time. Mental status is at baseline.  Psychiatric:        Mood and Affect: Mood normal.        Behavior: Behavior normal.      UC Treatments / Results  Labs (all labs ordered are listed, but only abnormal results are displayed) Labs Reviewed  CULTURE, GROUP A STREP [facility])  POCT RAPID STREP A (OFFICE)    EKG   Radiology DG Chest 2 View  Result Date: 03/04/2023 CLINICAL DATA:  Cough, shortness of breath. EXAM: CHEST - 2 VIEW COMPARISON:  None Available. FINDINGS: The heart size and mediastinal contours  are within normal limits. Both lungs are clear. The visualized skeletal structures are unremarkable. IMPRESSION: No active cardiopulmonary disease. Electronically Signed   By: Lupita Raider M.D.   On: 03/04/2023 14:23    Procedures Procedures (including critical care time)  Medications Ordered in UC Medications - No data to display  Initial Impression / Assessment and Plan / UC Course  I have reviewed the triage vital signs and the nursing notes.  Pertinent labs & imaging results that were available during my care of the patient were reviewed by me and considered in my medical decision making (see chart for details).     Chest x-ray completed that was negative for any acute cardiopulmonary process.  Suspect mild asthma exacerbation versus postviral cough versus acute bronchitis.  Will treat with albuterol inhaler, Augmentin antibiotic given duration of symptoms, cough medication, prednisone steroid to decrease inflammation.  Advised strict follow-up precautions if any symptoms persist or worsen.  Patient verbalized understanding and was agreeable with plan. Final Clinical Impressions(s) / UC Diagnoses   Final diagnoses:  Acute upper respiratory infection  Persistent cough  Sore throat     Discharge Instructions      Rapid strep is negative.  Throat culture is pending.  I will call if x-ray is abnormal.  I have prescribed you four medications to help with symptoms.  Follow-up if any symptoms persist or  worsen.     ED Prescriptions     Medication Sig Dispense Auth. Provider   amoxicillin-clavulanate (AUGMENTIN) 875-125 MG tablet Take 1 tablet by mouth every 12 (twelve) hours. 14 tablet [place], [place] E, [place]   predniSONE (DELTASONE) 20 MG tablet Take 2 tablets (40 mg total) by mouth daily for 5 days. 10 tablet [place], Rolly Salter E, [place]   albuterol (VENTOLIN HFA) 108 (90 Base) MCG/ACT inhaler Inhale 1-2 puffs into the lungs every 6 (six) hours as needed for wheezing or shortness of  breath. 1 each [place], [place] E, [place]   benzonatate (TESSALON) 100 MG capsule Take 1 capsule (100 mg total) by mouth every 8 (eight) hours as needed for cough. 21 capsule [place], Acie Fredrickson, [place]      PDMP not reviewed this encounter.   Gustavus Bryant, [place] 03/04/23 [phone]

## 2023-03-04 NOTE — Discharge Instructions (Signed)
Rapid strep is negative.  Throat culture is pending.  I will call if x-ray is abnormal.  I have prescribed you four medications to help with symptoms.  Follow-up if any symptoms persist or worsen.

## 2023-03-04 NOTE — ED Triage Notes (Signed)
Pt c/o cough, sore throat, SOB, ear pain, and fever since Saturday.  States it feels like it has settled in her chest.

## 2023-03-07 LAB — CULTURE, GROUP A STREP (THRC)

## 2023-03-08 NOTE — Progress Notes (Unsigned)
   Established Patient Office Visit   Subjective  Patient ID: Roberta Guerrero, female    DOB: 1995/05/29  Age: 27 y.o. MRN: [id number]  No chief complaint on file.   She  has a past medical history of Allergic rhinitis, Asthma, Headache, History of chlamydia, Hypertension, and Menstrual migraine.  HPI  ROS    Objective:     LMP 02/04/2023 (Approximate)  {Vitals History (Optional):23777}  Physical Exam   No results found for any visits on 03/09/23.  The ASCVD Risk score (Arnett DK, et al., 2019) failed to calculate for the following reasons:   The 2019 ASCVD risk score is only valid for ages 89 to 61    Assessment & Plan:  There are no diagnoses linked to this encounter.  No follow-ups on file.   Cruzita Lederer Newman Nip, FNP

## 2023-03-08 NOTE — Patient Instructions (Signed)

## 2023-03-09 ENCOUNTER — Encounter (INDEPENDENT_AMBULATORY_CARE_PROVIDER_SITE_OTHER): Payer: Medicaid Other | Admitting: Family Medicine

## 2023-03-09 DIAGNOSIS — I10 Essential (primary) hypertension: Secondary | ICD-10-CM

## 2023-03-09 NOTE — Progress Notes (Signed)
NO SHOW

## 2023-03-24 ENCOUNTER — Encounter: Payer: Self-pay | Admitting: Family Medicine

## 2023-08-24 ENCOUNTER — Ambulatory Visit: Payer: Self-pay

## 2023-08-30 ENCOUNTER — Encounter (HOSPITAL_COMMUNITY): Payer: Self-pay

## 2023-08-30 ENCOUNTER — Ambulatory Visit (INDEPENDENT_AMBULATORY_CARE_PROVIDER_SITE_OTHER): Payer: Self-pay

## 2023-08-30 ENCOUNTER — Ambulatory Visit (HOSPITAL_COMMUNITY)
Admission: EM | Admit: 2023-08-30 | Discharge: 2023-08-30 | Disposition: A | Discharge status: Home / Self Care | Payer: Self-pay

## 2023-08-30 DIAGNOSIS — R062 Wheezing: Secondary | ICD-10-CM

## 2023-08-30 DIAGNOSIS — J40 Bronchitis, not specified as acute or chronic: Secondary | ICD-10-CM

## 2023-08-30 MED ORDER — IPRATROPIUM-ALBUTEROL 0.5-2.5 (3) MG/3ML IN SOLN
3.0000 mL | Freq: Once | RESPIRATORY_TRACT | Status: AC
Start: 2023-08-30 — End: 2023-08-30
  Administered 2023-08-30: 3 mL via RESPIRATORY_TRACT

## 2023-08-30 MED ORDER — PREDNISONE 20 MG PO TABS: 40.0000 mg | ORAL_TABLET | Freq: Every day | ORAL | 0 refills | Status: AC

## 2023-08-30 MED ORDER — ALBUTEROL SULFATE HFA 108 (90 BASE) MCG/ACT IN AERS: 1.0000 | INHALATION_SPRAY | Freq: Four times a day (QID) | RESPIRATORY_TRACT | 0 refills | Status: AC | PRN

## 2023-08-30 MED ORDER — FEXOFENADINE HCL 180 MG PO TABS: 180.0000 mg | ORAL_TABLET | Freq: Every day | ORAL | 1 refills | Status: AC

## 2023-08-30 MED ORDER — AZELASTINE HCL 0.1 % NA SOLN: 1.0000 | Freq: Two times a day (BID) | NASAL | 1 refills | Status: AC

## 2023-08-30 MED ORDER — IPRATROPIUM-ALBUTEROL 0.5-2.5 (3) MG/3ML IN SOLN
RESPIRATORY_TRACT | Status: AC
  Filled 2023-08-30: qty 3

## 2023-08-30 NOTE — ED Triage Notes (Addendum)
 Pt c/o cough and congestion x1wk. States now having chest tightness and wheezing. Took OTC meds with no relief. States used her inhaler x4 today with no relief.

## 2023-08-30 NOTE — Discharge Instructions (Signed)
  1. Bronchitis (Primary) - DG Chest 2 View x-ray performed in UC shows no acute cardiopulmonary processes, no sign of consolidation or pneumonia. - ipratropium-albuterol  (DUONEB) 0.5-2.5 (3) MG/3ML nebulizer solution 3 mL given in UC for acute wheezing and bronchitis symptoms. - predniSONE  (DELTASONE ) 20 MG tablet; Take 2 tablets (40 mg total) by mouth daily for 5 days.  Dispense: 10 tablet; Refill: 0 - fexofenadine  (ALLEGRA ) 180 MG tablet; Take 1 tablet (180 mg total) by mouth daily.  Dispense: 60 tablet; Refill: 1 - azelastine  (ASTELIN ) 0.1 % nasal spray; Place 1 spray into both nostrils 2 (two) times daily. Use in each nostril as directed  Dispense: 30 mL; Refill: 1 - albuterol  (VENTOLIN  HFA) 108 (90 Base) MCG/ACT inhaler; Inhale 1-2 puffs into the lungs every 6 (six) hours as needed for wheezing or shortness of breath.  Dispense: 1 each; Refill: 0 -Continue to monitor symptoms for any change in severity if there is any escalation of current symptoms or development of new symptoms follow-up in ER for further evaluation and management.

## 2023-08-30 NOTE — ED Provider Notes (Signed)
 [facility]  Note:  This document was prepared using Dragon voice recognition software and may include unintentional dictation errors.  MRN: [id number] DOB: 1995-08-11  Subjective:   Roberta Guerrero is a 28 y.o. female presenting for cough and nasal congestion x 1 week.  Patient states that over the last day and a half she started having some tightness in her chest and wheezing patient has history of asthma.  Patient has been using inhaler approximately 4 times a day with minimal to no relief.  Patient has taken some over-the-counter Sudafed with minimal relief to symptoms.  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  No current facility-administered medications for this encounter.  Current Outpatient Medications:    azelastine  (ASTELIN ) 0.1 % nasal spray, Place 1 spray into both nostrils 2 (two) times daily. Use in each nostril as directed, Disp: 30 mL, Rfl: 1   EPINEPHrine  0.3 mg/0.3 mL IJ SOAJ injection, Inject 0.3 mg into the muscle as needed for anaphylaxis., Disp: 1 each, Rfl: 3   fexofenadine  (ALLEGRA ) 180 MG tablet, Take 1 tablet (180 mg total) by mouth daily., Disp: 60 tablet, Rfl: 1   predniSONE  (DELTASONE ) 20 MG tablet, Take 2 tablets (40 mg total) by mouth daily for 5 days., Disp: 10 tablet, Rfl: 0   albuterol  (VENTOLIN  HFA) 108 (90 Base) MCG/ACT inhaler, Inhale 1-2 puffs into the lungs every 6 (six) hours as needed for wheezing or shortness of breath., Disp: 1 each, Rfl: 0   benzonatate  (TESSALON ) 100 MG capsule, Take 1 capsule (100 mg total) by mouth every 8 (eight) hours as needed for cough., Disp: 21 capsule, Rfl: 0   budesonide -formoterol  (SYMBICORT ) 80-4.5 MCG/ACT inhaler, Inhale 2 puffs into the lungs 2 (two) times daily. (Patient not taking: Reported on 03/04/2023), Disp: 1 each, Rfl: 12   famotidine  (PEPCID ) 20 MG tablet, Take 1 tablet (20 mg total) by mouth at bedtime. (Patient not  taking: Reported on 03/04/2023), Disp: 30 tablet, Rfl: 1   fluticasone  (FLONASE ) 50 MCG/ACT nasal spray, Place 1 spray into both nostrils daily. (Patient not taking: Reported on 03/04/2023), Disp: 16 g, Rfl: 2   ibuprofen  (ADVIL ) 600 MG tablet, Take 1 tablet (600 mg total) by mouth every 6 (six) hours. (Patient not taking: Reported on 03/04/2023), Disp: 30 tablet, Rfl: 0   pantoprazole  (PROTONIX ) 20 MG tablet, TAKE 1 TABLET(20 MG) BY MOUTH DAILY FOR UP TO 1 DOSE AS NEEDED FOR INDIGESTION OR HEARTBURN (Patient not taking: Reported on 03/04/2023), Disp: 30 tablet, Rfl: 1   Allergies  Allergen Reactions   Other Rash    Nuts    Past Medical History:  Diagnosis Date   Allergic rhinitis    Asthma    Headache    History of chlamydia    Hypertension    Menstrual migraine      Past Surgical History:  Procedure Laterality Date   CHOLECYSTECTOMY     MOUTH SURGERY     SKIN BIOPSY      Family History  Problem Relation Age of Onset   Hypertension Mother    Hypertension Father    Hyperlipidemia Father    Autism Brother    Autism Brother    Hypertension Maternal Uncle    Hypertension Maternal Grandmother    Alcohol abuse Maternal Grandfather     Social History   Tobacco Use   Smoking status: Former    Types: Software engineer  Smokeless tobacco: Never  Vaping Use   Vaping status: Never Used  Substance Use Topics   Alcohol use: Not Currently    Comment: occasional   Drug use: Not Currently    Types: Marijuana    ROS Refer to HPI for ROS details.  Objective:   Vitals: BP (!) 151/91 (BP Location: Left Arm)   Pulse 69   Temp 98.6 F (37 C) (Oral)   Resp (!) 22   SpO2 98%   Physical Exam Vitals and nursing note reviewed.  Constitutional:      General: She is not in acute distress.    Appearance: Normal appearance. She is well-developed. She is not ill-appearing or toxic-appearing.  HENT:     Head: Normocephalic and atraumatic.     Nose: Congestion and rhinorrhea present.      Mouth/Throat:     Mouth: Mucous membranes are moist.  Eyes:     General:        Right eye: No discharge.        Left eye: No discharge.     Extraocular Movements: Extraocular movements intact.     Conjunctiva/sclera: Conjunctivae normal.  Cardiovascular:     Rate and Rhythm: Normal rate and regular rhythm.     Heart sounds: Normal heart sounds. No murmur heard. Pulmonary:     Effort: Pulmonary effort is normal. No respiratory distress.     Breath sounds: No stridor. Wheezing present. No rhonchi or rales.  Chest:     Chest wall: No tenderness.  Skin:    General: Skin is warm and dry.  Neurological:     General: No focal deficit present.     Mental Status: She is alert and oriented to person, place, and time.  Psychiatric:        Mood and Affect: Mood normal.        Behavior: Behavior normal.     Procedures  No results found for this or any previous visit (from the past 24 hours).  No results found.   Assessment and Plan :     Discharge Instructions       1. Bronchitis (Primary) - DG Chest 2 View x-ray performed in UC shows no acute cardiopulmonary processes, no sign of consolidation or pneumonia. - ipratropium-albuterol  (DUONEB) 0.5-2.5 (3) MG/3ML nebulizer solution 3 mL given in UC for acute wheezing and bronchitis symptoms. - predniSONE  (DELTASONE ) 20 MG tablet; Take 2 tablets (40 mg total) by mouth daily for 5 days.  Dispense: 10 tablet; Refill: 0 - fexofenadine  (ALLEGRA ) 180 MG tablet; Take 1 tablet (180 mg total) by mouth daily.  Dispense: 60 tablet; Refill: 1 - azelastine  (ASTELIN ) 0.1 % nasal spray; Place 1 spray into both nostrils 2 (two) times daily. Use in each nostril as directed  Dispense: 30 mL; Refill: 1 - albuterol  (VENTOLIN  HFA) 108 (90 Base) MCG/ACT inhaler; Inhale 1-2 puffs into the lungs every 6 (six) hours as needed for wheezing or shortness of breath.  Dispense: 1 each; Refill: 0 -Continue to monitor symptoms for any change in severity if there  is any escalation of current symptoms or development of new symptoms follow-up in ER for further evaluation and management.    Madalena Kesecker B Taytem Ghattas   Sonnie Paw[place], [place], [place] 08/30/23 [phone]

## 2023-08-31 ENCOUNTER — Ambulatory Visit (HOSPITAL_COMMUNITY): Payer: Self-pay

## 2023-10-29 ENCOUNTER — Ambulatory Visit: Payer: Self-pay

## 2023-11-02 ENCOUNTER — Ambulatory Visit: Payer: Self-pay

## 2023-11-08 ENCOUNTER — Ambulatory Visit: Payer: Self-pay

## 2023-12-28 ENCOUNTER — Other Ambulatory Visit: Payer: Self-pay | Admitting: Family Medicine

## 2024-02-16 ENCOUNTER — Telehealth: Payer: Self-pay | Admitting: Physician Assistant

## 2024-02-16 DIAGNOSIS — B9689 Other specified bacterial agents as the cause of diseases classified elsewhere: Secondary | ICD-10-CM

## 2024-02-16 DIAGNOSIS — N760 Acute vaginitis: Secondary | ICD-10-CM

## 2024-02-16 MED ORDER — METRONIDAZOLE 0.75 % VA GEL
1.0000 | Freq: Every day | VAGINAL | 0 refills | Status: AC
Start: 2024-02-16 — End: 2024-02-23

## 2024-02-16 NOTE — Progress Notes (Signed)
 Virtual Visit Consent   Roberta Guerrero, you are scheduled for a virtual visit with a [facility] provider today. Just as with appointments in the office, your consent must be obtained to participate. Your consent will be active for this visit and any virtual visit you may have with one of our providers in the next 365 days. If you have a MyChart account, a copy of this consent can be sent to you electronically.  As this is a virtual visit, video technology does not allow for your provider to perform a traditional examination. This may limit your provider's ability to fully assess your condition. If your provider identifies any concerns that need to be evaluated in person or the need to arrange testing (such as labs, EKG, etc.), we will make arrangements to do so. Although advances in technology are sophisticated, we cannot ensure that it will always work on either your end or our end. If the connection with a video visit is poor, the visit may have to be switched to a telephone visit. With either a video or telephone visit, we are not always able to ensure that we have a secure connection.  By engaging in this virtual visit, you consent to the provision of healthcare and authorize for your insurance to be billed (if applicable) for the services provided during this visit. Depending on your insurance coverage, you may receive a charge related to this service.  I need to obtain your verbal consent now. Are you willing to proceed with your visit today? [name] has provided verbal consent on 02/16/2024 for a virtual visit (video or telephone). Delon [name], PA-C  Date: 02/16/2024 2:35 PM   Virtual Visit via Video Note   I, Delon [name], connected with  Roberta Guerrero  ([id number], January 08, 1996) on 02/16/24 at  2:30 PM EST by a video-enabled telemedicine application and verified that I am speaking with the correct person using two identifiers.  Location: Patient: Virtual Visit Location  Patient: Mobile Provider: Virtual Visit Location Provider: Home Office   I discussed the limitations of evaluation and management by telemedicine and the availability of in person appointments. The patient expressed understanding and agreed to proceed.    History of Present Illness: Roberta Guerrero is a 28 y.o. who identifies as a female who was assigned female at birth, and is being seen today for vaginal discharge.  HPI: Vaginal Discharge The patient's primary symptoms include a genital odor and vaginal discharge. This is a new problem. The current episode started more than 1 month ago (3 months). The problem occurs intermittently. The problem has been unchanged. The pain is mild. Pertinent negatives include no back pain, chills, dysuria, fever, flank pain, frequency or nausea. The vaginal discharge was malodorous, milky and white. There has been no bleeding. She has not been passing clots. She has not been passing tissue. The symptoms are aggravated by intercourse. Treatments tried: vaginal probiotics, yogurt. The treatment provided no relief. She is sexually active. No, her partner does not have an STD.     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   Gastritis 02/26/2013   Eczematous dermatitis of eyelid 09/26/2012   Eczema 09/26/2012   Asthma, chronic 08/09/2012   Allergic rhinitis 08/09/2012   Rash 08/09/2012    Allergies:  Allergies  Allergen Reactions   Other Rash    Nuts   Medications:  Current Outpatient  Medications:    EPINEPHrine  0.3 mg/0.3 mL IJ SOAJ injection, Inject 0.3 mg into the muscle as needed for anaphylaxis., Disp: 1 each, Rfl: 3   metroNIDAZOLE  (METROGEL ) 0.75 % vaginal gel, Place 1 Applicatorful vaginally at bedtime for 7 days., Disp: 70 g, Rfl: 0   albuterol  (VENTOLIN  HFA) 108 (90 Base) MCG/ACT inhaler, Inhale 1-2 puffs into the lungs every 6 (six) hours as needed for wheezing or shortness of breath., Disp: 1 each, Rfl: 0   azelastine  (ASTELIN ) 0.1 % nasal spray, Place 1 spray into both nostrils 2 (two) times daily. Use in each nostril as directed, Disp: 30 mL, Rfl: 1   benzonatate  (TESSALON ) 100 MG capsule, Take 1 capsule (100 mg total) by mouth every 8 (eight) hours as needed for cough., Disp: 21 capsule, Rfl: 0   budesonide -formoterol  (SYMBICORT ) 80-4.5 MCG/ACT inhaler, Inhale 2 puffs into the lungs 2 (two) times daily. (Patient not taking: Reported on 03/04/2023), Disp: 1 each, Rfl: 12   famotidine  (PEPCID ) 20 MG tablet, Take 1 tablet (20 mg total) by mouth at bedtime. (Patient not taking: Reported on 03/04/2023), Disp: 30 tablet, Rfl: 1   fexofenadine  (ALLEGRA ) 180 MG tablet, Take 1 tablet (180 mg total) by mouth daily., Disp: 60 tablet, Rfl: 1   fluticasone  (FLONASE ) 50 MCG/ACT nasal spray, Place 1 spray into both nostrils daily. (Patient not taking: Reported on 03/04/2023), Disp: 16 g, Rfl: 2   ibuprofen  (ADVIL ) 600 MG tablet, Take 1 tablet (600 mg total) by mouth every 6 (six) hours. (Patient not taking: Reported on 03/04/2023), Disp: 30 tablet, Rfl: 0   pantoprazole  (PROTONIX ) 20 MG tablet, TAKE 1 TABLET(20 MG) BY MOUTH DAILY FOR UP TO 1 DOSE AS NEEDED FOR INDIGESTION OR HEARTBURN (Patient not taking: Reported on 03/04/2023), Disp: 30 tablet, Rfl: 1  Observations/Objective: Patient is well-developed, well-nourished in no acute distress.  Resting comfortably at home.  Head is normocephalic, atraumatic.  No labored breathing.  Speech is clear and coherent with logical content.   Patient is alert and oriented at baseline.    Assessment and Plan: 1. BV (bacterial vaginosis) (Primary) - metroNIDAZOLE  (METROGEL ) 0.75 % vaginal gel; Place 1 Applicatorful vaginally at bedtime for 7 days.  Dispense: 70 g; Refill: 0  - Symptoms consistent with BV - Metronidazole  prescribed - Limit bubble baths, scented lotions/soaps/detergents - Limit tight fitting clothing - Seek on person evaluation if not improving or if symptoms worsen   Follow Up Instructions: I discussed the assessment and treatment plan with the patient. The patient was provided an opportunity to ask questions and all were answered. The patient agreed with the plan and demonstrated an understanding of the instructions.  A copy of instructions were sent to the patient via MyChart unless otherwise noted below.    The patient was advised to call back or seek an in-person evaluation if the symptoms worsen or if the condition fails to improve as anticipated.    Delon [name], PA-C

## 2024-02-16 NOTE — Patient Instructions (Signed)
 Roberta Guerrero, thank you for joining Delon [name], PA-C for today's virtual visit.  While this provider is not your primary care provider (PCP), if your PCP is located in our provider database this encounter information will be shared with them immediately following your visit.   A [facility] MyChart account gives you access to today's visit and all your visits, tests, and labs performed at [facility]  click here if you don't have a [facility] MyChart account or go to [email or web address]  Consent: (Patient) Roberta Guerrero provided verbal consent for this virtual visit at the beginning of the encounter.  Current Medications:  Current Outpatient Medications:    EPINEPHrine  0.3 mg/0.3 mL IJ SOAJ injection, Inject 0.3 mg into the muscle as needed for anaphylaxis., Disp: 1 each, Rfl: 3   metroNIDAZOLE  (METROGEL ) 0.75 % vaginal gel, Place 1 Applicatorful vaginally at bedtime for 7 days., Disp: 70 g, Rfl: 0   albuterol  (VENTOLIN  HFA) 108 (90 Base) MCG/ACT inhaler, Inhale 1-2 puffs into the lungs every 6 (six) hours as needed for wheezing or shortness of breath., Disp: 1 each, Rfl: 0   azelastine  (ASTELIN ) 0.1 % nasal spray, Place 1 spray into both nostrils 2 (two) times daily. Use in each nostril as directed, Disp: 30 mL, Rfl: 1   benzonatate  (TESSALON ) 100 MG capsule, Take 1 capsule (100 mg total) by mouth every 8 (eight) hours as needed for cough., Disp: 21 capsule, Rfl: 0   budesonide -formoterol  (SYMBICORT ) 80-4.5 MCG/ACT inhaler, Inhale 2 puffs into the lungs 2 (two) times daily. (Patient not taking: Reported on 03/04/2023), Disp: 1 each, Rfl: 12   famotidine  (PEPCID ) 20 MG tablet, Take 1 tablet (20 mg total) by mouth at bedtime. (Patient not taking: Reported on 03/04/2023), Disp: 30 tablet, Rfl: 1   fexofenadine  (ALLEGRA ) 180 MG tablet, Take 1 tablet (180 mg total) by mouth daily., Disp: 60 tablet, Rfl: 1   fluticasone  (FLONASE ) 50 MCG/ACT nasal spray, Place 1 spray  into both nostrils daily. (Patient not taking: Reported on 03/04/2023), Disp: 16 g, Rfl: 2   ibuprofen  (ADVIL ) 600 MG tablet, Take 1 tablet (600 mg total) by mouth every 6 (six) hours. (Patient not taking: Reported on 03/04/2023), Disp: 30 tablet, Rfl: 0   pantoprazole  (PROTONIX ) 20 MG tablet, TAKE 1 TABLET(20 MG) BY MOUTH DAILY FOR UP TO 1 DOSE AS NEEDED FOR INDIGESTION OR HEARTBURN (Patient not taking: Reported on 03/04/2023), Disp: 30 tablet, Rfl: 1   Medications ordered in this encounter:  Meds ordered this encounter  Medications   metroNIDAZOLE  (METROGEL ) 0.75 % vaginal gel    Sig: Place 1 Applicatorful vaginally at bedtime for 7 days.    Dispense:  70 g    Refill:  0    Supervising Provider:   BLAISE ALEENE KIDD [[id number]]     *If you need refills on other medications prior to your next appointment, please contact your pharmacy*  Follow-Up: Call back or seek an in-person evaluation if the symptoms worsen or if the condition fails to improve as anticipated.   Virtual Care [phone]  Other Instructions Vaginal Probiotics: AZO vaginal probiotic OLLY Happy Hoo-Ha RAW Vaginal Care RenewLife Women's vaginal probiotic RepHresh Pro-B  Vaginal washes: Honey Pot Summer's Eve Vagisil Feminine cleanser  Boric Acid Suppositories  Healthy vaginal hygiene practices    -  Avoid sleeper pajamas. Nightgowns allow air to circulate.  Sleep without underpants whenever possible.   -  Wear cotton underpants during the day. Double-rinse underwear after washing  to avoid residual irritants. Do not use fabric softeners for underwear and swimsuits.   - Avoid tights, leotards, leggings, skinny jeans, and other tight-fitting clothing. Skirts and loose-fitting pants allow air to circulate.   - Avoid pantyliners.  Instead use tampons or cotton pads.   - Use the restroom after intercourse to help prevent UTI's   - Daily warm bathing is helpful:     - Soak in clean water  (no  soap) for 10 to 15 minutes. Adding vinegar or baking soda to the water  has not been specifically studied and may not be better than clean water  alone.      - Use soap to wash regions other than the genital area just before getting out of the tub. Limit use of any soap on genital areas. Use fragance-free soaps.     - Rinse the genital area well and gently pat dry.  Don't rub.  Hair dryer to assist with drying can be used only if on cool setting.     - Do not use bubble baths or perfumed soaps.   - Do not use any feminine sprays, douches or powders.  These contain chemicals that will irritate the skin.   - If the genital area is tender or swollen, cool compresses may relieve the discomfort. Unscented wet wipes can be used instead of toilet paper for wiping.    - Emollients, such as Vaseline, may help protect skin and can be applied to the irritated area.   - Always remember to wipe front-to-back after bowel movements. Pat dry after urination.   - Do not sit in wet swimsuits for long periods of time after swimming    If you have been instructed to have an in-person evaluation today at a local Urgent Care facility, please use the link below. It will take you to a list of all of our available [facility], including address, phone number and hours of operation. Please do not delay care.  [facility]  If you or a family member do not have a primary care provider, use the link below to schedule a visit and establish care. When you choose a [facility] primary care physician or advanced practice provider, you gain a long-term partner in health. Find a Primary Care Provider  Learn more about [facility]'s in-office and virtual care options:  - Get Care Now

## 2024-05-09 ENCOUNTER — Ambulatory Visit (HOSPITAL_COMMUNITY): Payer: Self-pay
